# Patient Record
Sex: Male | Born: 1965
Health system: Southern US, Community
[De-identification: ages and names within clinical notes are randomized; demographics above are authoritative.]

## PROBLEM LIST (undated history)

## (undated) DIAGNOSIS — K432 Incisional hernia without obstruction or gangrene: Secondary | ICD-10-CM

## (undated) DIAGNOSIS — E785 Hyperlipidemia, unspecified: Secondary | ICD-10-CM

## (undated) DIAGNOSIS — M5136 Other intervertebral disc degeneration, lumbar region: Secondary | ICD-10-CM

## (undated) DIAGNOSIS — E1140 Type 2 diabetes mellitus with diabetic neuropathy, unspecified: Secondary | ICD-10-CM

## (undated) DIAGNOSIS — K859 Acute pancreatitis without necrosis or infection, unspecified: Secondary | ICD-10-CM

## (undated) DIAGNOSIS — I10 Essential (primary) hypertension: Secondary | ICD-10-CM

## (undated) DIAGNOSIS — N289 Disorder of kidney and ureter, unspecified: Secondary | ICD-10-CM

## (undated) DIAGNOSIS — K5792 Diverticulitis of intestine, part unspecified, without perforation or abscess without bleeding: Secondary | ICD-10-CM

## (undated) DIAGNOSIS — M109 Gout, unspecified: Secondary | ICD-10-CM

## (undated) DIAGNOSIS — I480 Paroxysmal atrial fibrillation: Secondary | ICD-10-CM

## (undated) DIAGNOSIS — K7460 Unspecified cirrhosis of liver: Secondary | ICD-10-CM

## (undated) DIAGNOSIS — M51369 Other intervertebral disc degeneration, lumbar region without mention of lumbar back pain or lower extremity pain: Secondary | ICD-10-CM

## (undated) DIAGNOSIS — E119 Type 2 diabetes mellitus without complications: Secondary | ICD-10-CM

## (undated) HISTORY — DX: Diverticulitis of intestine, part unspecified, without perforation or abscess without bleeding: K57.92

## (undated) HISTORY — DX: Other intervertebral disc degeneration, lumbar region: M51.36

## (undated) HISTORY — DX: Gout, unspecified: M10.9

## (undated) HISTORY — DX: Essential (primary) hypertension: I10

## (undated) HISTORY — DX: Other intervertebral disc degeneration, lumbar region without mention of lumbar back pain or lower extremity pain: M51.369

## (undated) SURGERY — Surgical Case
Anesthesia: *Unknown

---

## 1991-04-24 HISTORY — PX: LAPAROSCOPIC CHOLECYSTECTOMY: SUR755

## 2000-08-14 ENCOUNTER — Encounter: Payer: Self-pay | Admitting: *Deleted

## 2000-08-14 ENCOUNTER — Emergency Department (HOSPITAL_COMMUNITY): Admission: EM | Admit: 2000-08-14 | Discharge: 2000-08-14 | Payer: Self-pay | Admitting: *Deleted

## 2004-04-06 ENCOUNTER — Inpatient Hospital Stay (HOSPITAL_COMMUNITY): Admission: EM | Admit: 2004-04-06 | Discharge: 2004-04-10 | Payer: Self-pay | Admitting: Emergency Medicine

## 2004-05-01 ENCOUNTER — Ambulatory Visit (HOSPITAL_COMMUNITY): Admission: RE | Admit: 2004-05-01 | Discharge: 2004-05-01 | Payer: Self-pay | Admitting: General Surgery

## 2007-07-28 ENCOUNTER — Emergency Department (HOSPITAL_COMMUNITY): Admission: EM | Admit: 2007-07-28 | Discharge: 2007-07-28 | Payer: Self-pay | Admitting: Emergency Medicine

## 2007-08-13 ENCOUNTER — Ambulatory Visit (HOSPITAL_COMMUNITY): Admission: RE | Admit: 2007-08-13 | Discharge: 2007-08-13 | Payer: Self-pay | Admitting: Internal Medicine

## 2007-08-22 ENCOUNTER — Encounter: Admission: RE | Admit: 2007-08-22 | Discharge: 2007-08-22 | Payer: Self-pay | Admitting: Internal Medicine

## 2007-10-20 ENCOUNTER — Ambulatory Visit (HOSPITAL_COMMUNITY): Admission: RE | Admit: 2007-10-20 | Discharge: 2007-10-20 | Payer: Self-pay | Admitting: Internal Medicine

## 2007-11-03 ENCOUNTER — Ambulatory Visit: Admission: RE | Admit: 2007-11-03 | Discharge: 2007-11-03 | Payer: Self-pay | Admitting: Internal Medicine

## 2008-05-06 ENCOUNTER — Encounter: Payer: Self-pay | Admitting: Orthopedic Surgery

## 2008-05-19 ENCOUNTER — Ambulatory Visit: Payer: Self-pay | Admitting: Orthopedic Surgery

## 2008-05-19 DIAGNOSIS — M23302 Other meniscus derangements, unspecified lateral meniscus, unspecified knee: Secondary | ICD-10-CM | POA: Insufficient documentation

## 2008-05-19 DIAGNOSIS — M25569 Pain in unspecified knee: Secondary | ICD-10-CM | POA: Insufficient documentation

## 2008-06-01 ENCOUNTER — Encounter: Admission: RE | Admit: 2008-06-01 | Discharge: 2008-06-01 | Payer: Self-pay | Admitting: Orthopedic Surgery

## 2008-06-01 ENCOUNTER — Telehealth: Payer: Self-pay | Admitting: Orthopedic Surgery

## 2008-06-07 ENCOUNTER — Encounter: Admission: RE | Admit: 2008-06-07 | Discharge: 2008-06-07 | Payer: Self-pay | Admitting: Orthopedic Surgery

## 2008-08-16 ENCOUNTER — Ambulatory Visit (HOSPITAL_COMMUNITY): Admission: RE | Admit: 2008-08-16 | Discharge: 2008-08-16 | Payer: Self-pay | Admitting: Internal Medicine

## 2008-10-04 ENCOUNTER — Emergency Department (HOSPITAL_COMMUNITY): Admission: EM | Admit: 2008-10-04 | Discharge: 2008-10-04 | Payer: Self-pay | Admitting: Emergency Medicine

## 2009-01-10 ENCOUNTER — Emergency Department (HOSPITAL_COMMUNITY): Admission: EM | Admit: 2009-01-10 | Discharge: 2009-01-10 | Payer: Self-pay | Admitting: Emergency Medicine

## 2009-01-16 ENCOUNTER — Ambulatory Visit: Payer: Self-pay | Admitting: Internal Medicine

## 2009-01-16 ENCOUNTER — Inpatient Hospital Stay (HOSPITAL_COMMUNITY): Admission: EM | Admit: 2009-01-16 | Discharge: 2009-01-31 | Payer: Self-pay | Admitting: Emergency Medicine

## 2009-01-17 ENCOUNTER — Ambulatory Visit: Payer: Self-pay | Admitting: Gastroenterology

## 2009-01-19 ENCOUNTER — Ambulatory Visit: Payer: Self-pay | Admitting: Gastroenterology

## 2009-01-21 ENCOUNTER — Encounter: Payer: Self-pay | Admitting: Surgery

## 2009-02-08 ENCOUNTER — Ambulatory Visit (HOSPITAL_COMMUNITY): Admission: RE | Admit: 2009-02-08 | Discharge: 2009-02-08 | Payer: Self-pay | Admitting: Surgery

## 2009-02-11 ENCOUNTER — Encounter: Payer: Self-pay | Admitting: Gastroenterology

## 2009-02-22 ENCOUNTER — Ambulatory Visit (HOSPITAL_COMMUNITY): Admission: RE | Admit: 2009-02-22 | Discharge: 2009-02-22 | Payer: Self-pay | Admitting: Surgery

## 2009-02-24 ENCOUNTER — Ambulatory Visit: Payer: Self-pay | Admitting: Gastroenterology

## 2009-02-24 ENCOUNTER — Ambulatory Visit (HOSPITAL_COMMUNITY): Admission: RE | Admit: 2009-02-24 | Discharge: 2009-02-24 | Payer: Self-pay | Admitting: Gastroenterology

## 2009-03-11 ENCOUNTER — Encounter: Admission: RE | Admit: 2009-03-11 | Discharge: 2009-03-11 | Payer: Self-pay | Admitting: Surgery

## 2009-04-11 ENCOUNTER — Inpatient Hospital Stay (HOSPITAL_COMMUNITY): Admission: RE | Admit: 2009-04-11 | Discharge: 2009-04-25 | Payer: Self-pay | Admitting: Surgery

## 2009-04-15 ENCOUNTER — Encounter (INDEPENDENT_AMBULATORY_CARE_PROVIDER_SITE_OTHER): Payer: Self-pay | Admitting: Surgery

## 2009-04-15 DIAGNOSIS — L03319 Cellulitis of trunk, unspecified: Secondary | ICD-10-CM

## 2009-04-15 DIAGNOSIS — Z9089 Acquired absence of other organs: Secondary | ICD-10-CM | POA: Insufficient documentation

## 2009-04-15 DIAGNOSIS — L02219 Cutaneous abscess of trunk, unspecified: Secondary | ICD-10-CM | POA: Insufficient documentation

## 2009-04-15 DIAGNOSIS — K6389 Other specified diseases of intestine: Secondary | ICD-10-CM | POA: Insufficient documentation

## 2009-04-15 HISTORY — PX: COLON SURGERY: SHX602

## 2009-04-15 HISTORY — PX: APPENDECTOMY: SHX54

## 2009-04-19 ENCOUNTER — Ambulatory Visit: Payer: Self-pay | Admitting: Internal Medicine

## 2009-04-25 ENCOUNTER — Ambulatory Visit: Payer: Self-pay | Admitting: Infectious Diseases

## 2009-04-27 ENCOUNTER — Encounter: Payer: Self-pay | Admitting: Infectious Diseases

## 2009-04-27 ENCOUNTER — Ambulatory Visit: Payer: Self-pay

## 2009-05-04 ENCOUNTER — Telehealth: Payer: Self-pay | Admitting: Internal Medicine

## 2009-05-05 ENCOUNTER — Telehealth: Payer: Self-pay | Admitting: Internal Medicine

## 2009-05-06 ENCOUNTER — Encounter (INDEPENDENT_AMBULATORY_CARE_PROVIDER_SITE_OTHER): Payer: Self-pay | Admitting: *Deleted

## 2009-05-10 ENCOUNTER — Inpatient Hospital Stay (HOSPITAL_COMMUNITY): Admission: RE | Admit: 2009-05-10 | Discharge: 2009-05-18 | Payer: Self-pay | Admitting: Internal Medicine

## 2009-05-10 ENCOUNTER — Ambulatory Visit: Payer: Self-pay | Admitting: Internal Medicine

## 2009-05-30 ENCOUNTER — Encounter (INDEPENDENT_AMBULATORY_CARE_PROVIDER_SITE_OTHER): Payer: Self-pay | Admitting: *Deleted

## 2009-05-30 DIAGNOSIS — Z720 Tobacco use: Secondary | ICD-10-CM | POA: Insufficient documentation

## 2009-05-30 DIAGNOSIS — I10 Essential (primary) hypertension: Secondary | ICD-10-CM | POA: Insufficient documentation

## 2009-05-30 DIAGNOSIS — M109 Gout, unspecified: Secondary | ICD-10-CM | POA: Insufficient documentation

## 2009-05-31 ENCOUNTER — Ambulatory Visit: Payer: Self-pay | Admitting: Internal Medicine

## 2009-05-31 ENCOUNTER — Encounter: Payer: Self-pay | Admitting: Gastroenterology

## 2009-05-31 LAB — CONVERTED CEMR LAB
CO2: 22 meq/L (ref 19–32)
Glucose, Bld: 112 mg/dL — ABNORMAL HIGH (ref 70–99)
HCT: 39.3 % (ref 39.0–52.0)
Hemoglobin: 12.8 g/dL — ABNORMAL LOW (ref 13.0–17.0)
MCV: 97.8 fL (ref >=78.0)
RBC: 4.02 M/uL — ABNORMAL LOW (ref 4.22–5.81)
Sodium: 135 meq/L (ref 135–145)
Total Bilirubin: 0.3 mg/dL (ref 0.3–1.2)
Total Protein: 7.4 g/dL (ref 6.0–8.3)
WBC: 10.9 10*3/uL — ABNORMAL HIGH (ref 4.0–10.5)

## 2009-06-02 ENCOUNTER — Encounter: Payer: Self-pay | Admitting: Internal Medicine

## 2009-06-02 ENCOUNTER — Encounter: Payer: Self-pay | Admitting: Gastroenterology

## 2009-06-03 ENCOUNTER — Telehealth: Payer: Self-pay

## 2009-06-03 ENCOUNTER — Encounter: Payer: Self-pay | Admitting: Internal Medicine

## 2009-06-07 ENCOUNTER — Encounter: Payer: Self-pay | Admitting: Internal Medicine

## 2009-06-08 ENCOUNTER — Encounter: Payer: Self-pay | Admitting: Internal Medicine

## 2009-06-08 ENCOUNTER — Encounter: Admission: RE | Admit: 2009-06-08 | Discharge: 2009-06-08 | Payer: Self-pay | Admitting: Surgery

## 2009-06-09 ENCOUNTER — Telehealth: Payer: Self-pay | Admitting: Internal Medicine

## 2009-06-17 ENCOUNTER — Ambulatory Visit (HOSPITAL_COMMUNITY): Admission: RE | Admit: 2009-06-17 | Discharge: 2009-06-17 | Payer: Self-pay | Admitting: Surgery

## 2009-06-17 ENCOUNTER — Encounter: Payer: Self-pay | Admitting: Internal Medicine

## 2009-06-21 ENCOUNTER — Ambulatory Visit: Payer: Self-pay | Admitting: Internal Medicine

## 2009-06-29 ENCOUNTER — Encounter: Payer: Self-pay | Admitting: Internal Medicine

## 2009-07-07 ENCOUNTER — Encounter: Admission: RE | Admit: 2009-07-07 | Discharge: 2009-07-07 | Payer: Self-pay | Admitting: Surgery

## 2009-07-09 ENCOUNTER — Encounter: Payer: Self-pay | Admitting: Internal Medicine

## 2009-07-11 ENCOUNTER — Encounter: Payer: Self-pay | Admitting: Internal Medicine

## 2009-07-13 ENCOUNTER — Encounter: Payer: Self-pay | Admitting: Internal Medicine

## 2009-07-14 ENCOUNTER — Telehealth: Payer: Self-pay | Admitting: Internal Medicine

## 2009-07-20 ENCOUNTER — Encounter: Payer: Self-pay | Admitting: Internal Medicine

## 2009-07-21 ENCOUNTER — Encounter: Payer: Self-pay | Admitting: Internal Medicine

## 2009-08-02 ENCOUNTER — Ambulatory Visit: Payer: Self-pay | Admitting: Internal Medicine

## 2009-08-02 ENCOUNTER — Ambulatory Visit (HOSPITAL_COMMUNITY): Admission: RE | Admit: 2009-08-02 | Discharge: 2009-08-02 | Payer: Self-pay | Admitting: Surgery

## 2009-08-04 ENCOUNTER — Encounter: Payer: Self-pay | Admitting: Internal Medicine

## 2009-08-10 ENCOUNTER — Ambulatory Visit: Payer: Self-pay | Admitting: Internal Medicine

## 2009-08-10 DIAGNOSIS — K5732 Diverticulitis of large intestine without perforation or abscess without bleeding: Secondary | ICD-10-CM | POA: Insufficient documentation

## 2009-08-23 ENCOUNTER — Encounter: Payer: Self-pay | Admitting: Internal Medicine

## 2009-08-30 ENCOUNTER — Ambulatory Visit (HOSPITAL_COMMUNITY): Admission: RE | Admit: 2009-08-30 | Discharge: 2009-08-30 | Payer: Self-pay | Admitting: Surgery

## 2009-09-05 ENCOUNTER — Encounter: Admission: RE | Admit: 2009-09-05 | Discharge: 2009-09-05 | Payer: Self-pay | Admitting: Surgery

## 2009-09-12 ENCOUNTER — Encounter: Payer: Self-pay | Admitting: Internal Medicine

## 2009-10-04 ENCOUNTER — Ambulatory Visit (HOSPITAL_COMMUNITY): Admission: RE | Admit: 2009-10-04 | Discharge: 2009-10-04 | Payer: Self-pay | Admitting: Surgery

## 2009-10-06 ENCOUNTER — Encounter: Payer: Self-pay | Admitting: Gastroenterology

## 2009-10-11 ENCOUNTER — Encounter: Payer: Self-pay | Admitting: Internal Medicine

## 2009-12-05 ENCOUNTER — Encounter: Payer: Self-pay | Admitting: Internal Medicine

## 2009-12-05 ENCOUNTER — Ambulatory Visit (HOSPITAL_COMMUNITY): Admission: RE | Admit: 2009-12-05 | Discharge: 2009-12-05 | Payer: Self-pay | Admitting: Surgery

## 2010-01-19 ENCOUNTER — Encounter: Payer: Self-pay | Admitting: Gastroenterology

## 2010-01-24 ENCOUNTER — Ambulatory Visit (HOSPITAL_COMMUNITY): Admission: RE | Admit: 2010-01-24 | Discharge: 2010-01-24 | Payer: Self-pay | Admitting: Surgery

## 2010-02-03 ENCOUNTER — Inpatient Hospital Stay (HOSPITAL_COMMUNITY): Admission: RE | Admit: 2010-02-03 | Discharge: 2010-02-08 | Payer: Self-pay | Admitting: Surgery

## 2010-02-03 ENCOUNTER — Ambulatory Visit: Payer: Self-pay | Admitting: Pulmonary Disease

## 2010-02-03 ENCOUNTER — Encounter (INDEPENDENT_AMBULATORY_CARE_PROVIDER_SITE_OTHER): Payer: Self-pay | Admitting: Surgery

## 2010-02-09 ENCOUNTER — Encounter (INDEPENDENT_AMBULATORY_CARE_PROVIDER_SITE_OTHER): Payer: Self-pay | Admitting: *Deleted

## 2010-02-16 ENCOUNTER — Encounter: Payer: Self-pay | Admitting: Internal Medicine

## 2010-03-08 ENCOUNTER — Encounter: Payer: Self-pay | Admitting: Internal Medicine

## 2010-05-14 ENCOUNTER — Encounter: Payer: Self-pay | Admitting: Internal Medicine

## 2010-05-14 ENCOUNTER — Encounter: Payer: Self-pay | Admitting: Surgery

## 2010-05-23 NOTE — Medication Information (Signed)
Summary: [facility]: Verbal Orders  [facility]: Verbal Orders   Imported By: Florinda Marker 06/14/2009 15:34:38  _____________________________________________________________________  External Attachment:    Type:   Image     Comment:   External Document

## 2010-05-23 NOTE — Assessment & Plan Note (Signed)
Summary: F/U APPT/VS   CC:  follow-up visit after JP drain study.  History of Present Illness: [name] is in for his routine visit. After his last visit he had a repeat abdominal drain study. 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  Just like his previous strain study, he experienced abdominal pain and low grade fever in the evening after the study but this resolved spontaneously.  He has had no further fever and his appetite has improved.  Overall he is feeling better.  He is not on any antibiotics.  He estimates the room he only has a few cc of drainage from the catheter he stay.  He saw Dr. Michaell Cowing  this morning and the drain was advanced approximately 4 inches.  Preventive Screening-Counseling & Management  Alcohol-Tobacco     Alcohol drinks/day: <1     Smoking Status: current     Smoking Cessation Counseling: yes     Smoke Cessation Stage: precontemplative     Packs/Day: 1.0  Caffeine-Diet-Exercise     Caffeine use/day: yes     Does Patient Exercise: yes     Type of exercise: walk     Times/week: <3  Safety-Violence-Falls     Seat Belt Use: yes   Current Allergies (reviewed today): No known allergies  Vital Signs:  Patient profile:   45 year old male Height:      71 inches (180.34 cm) Weight:      238.4 pounds (108.36 kg) BMI:     33.37 Pulse rate:   65 / minute BP sitting:   122 / 86  (left arm)  Vitals Entered By: Wendall Mola CMA Duncan Dull) (August 10, 2009 3:14 PM) CC: follow-up visit after JP drain study Is Patient Diabetic? No Pain Assessment Patient in pain? no      Nutritional Status BMI of > 30 = obese Nutritional Status Detail appetite "good"  Does patient need assistance? Functional Status Self care Ambulation Normal Comments no missed doses of meds per patient   Physical Exam  General:  alert and overweight-appearing.   Abdomen:  soft, non-tender, and normal bowel sounds.  his  ostomy bag appears normal.  There is a small amount of bilious drainage in the Jackson-Pratt bulb and a small amount of bloody drainage in the catheter.   Impression & Recommendations:  Problem # 1:  ABSCESS, INTRAABDOMINAL (ICD-682.2) He has a controlled colocutaneous fistula without any evidence of active infection at this time.  I favor continued observation off of antibiotics. His updated medication list for this problem includes:    Oxycodone Hcl 5 Mg Tabs (Oxycodone hcl) .Marland Kitchen... Take 1 tablet by mouth four times a day as needed for pain  Orders: Est. Patient Level III (29518)  Patient Instructions: 1)  Please schedule a follow-up appointment in 1 month on May 24 at 11:30 AM.

## 2010-05-23 NOTE — Miscellaneous (Signed)
Summary: [facility]: Orders  [facility]: Orders   Imported By: Florinda Marker 06/20/2009 09:41:13  _____________________________________________________________________  External Attachment:    Type:   Image     Comment:   External Document

## 2010-05-23 NOTE — Consult Note (Signed)
Summary: Dr. Theodosia Paling  Dr. Theodosia Paling   Imported By: Florinda Marker 10/26/2009 09:42:01  _____________________________________________________________________  External Attachment:    Type:   Image     Comment:   External Document

## 2010-05-23 NOTE — Assessment & Plan Note (Signed)
Summary: F/U/VS   CC:  follow-up visit.  History of Present Illness: [name] continues to struggle with his very stubborn intra-abdominal abscesses. He   Has had multiple abdominal surgeries since last September and has been on multiple courses of broad-spectrum antibiotics.  He had a drain last fall and was on antibiotics in September and October.  His abscess he is relapsed in December and he had repeat incision and drainage followed by a prolonged course of IV tetracycline and oral Diflucan which she completed on February 17.  His Jackson-Pratt drain has remained in.  Last month a CT scan on March 17 showed a small residual abscess collection around the tip of the drain.  He noted that the drainage became thicker and green.  A specimen was obtained from the drain and grew pseudomonas. He has just completed 20 days of antibiotic therapy with oral Septra and Cipro.  He has not had any fevers, chills, abdominal pain or other new problems.  He says he is having very scant drainage with only a diffuse cc out over the past week.  He is scheduled for a repeat drain study today.  He thinks he had a flare of gout in his left second toe last week.  He could not afford the new preparation of colchicine so he took some left over allopurinol and felt better.  He has not seen his primary care physician, Dr. Dwana Melena in several months.  Preventive Screening-Counseling & Management  Alcohol-Tobacco     Alcohol drinks/day: <1     Smoking Status: current     Smoking Cessation Counseling: yes     Smoke Cessation Stage: precontemplative     Packs/Day: 1.0  Caffeine-Diet-Exercise     Caffeine use/day: yes     Does Patient Exercise: yes     Type of exercise: walk     Times/week: <3  Safety-Violence-Falls     Seat Belt Use: yes   Current Allergies (reviewed today): No known allergies  Vital Signs:  Patient profile:   45 year old male Height:      71 inches (180.34 cm) Weight:      238.25  pounds (108.30 kg) BMI:     33.35 Temp:     97.4 degrees F (36.33 degrees C) oral Pulse rate:   65 / minute BP sitting:   117 / 79  (left arm) Cuff size:   large  Vitals Entered By: Jennet Maduro RN (August 02, 2009 10:55 AM) CC: follow-up visit Is Patient Diabetic? No Pain Assessment Patient in pain? no      Nutritional Status BMI of > 30 = obese Nutritional Status Detail appetite "fine"  Have you ever been in a relationship where you felt threatened, hurt or afraid?not asked   Does patient need assistance? Functional Status Self care Ambulation Normal   Physical Exam  General:  alert and overweight-appearing.   Abdomen:  soft, non-tender, and normal bowel sounds.  obese.  His ostomy bag is full of soft brown stool.  He has about 2 cc of light brown drainage in the Jackson-Pratt drain.   Impression & Recommendations:  Problem # 1:  ABSCESS, INTRAABDOMINAL (ICD-682.2) Hopefully the drain study today will show improvement and the drain can be removed.  I do not know what to make of the positive cultures from the drain as they are certainly colonized at this point.  I am not sure if he has any residual abscess but I will leave him off of  antibiotics for now and see him back in one week. The following medications were removed from the medication list:    Ciprofloxacin Hcl 750 Mg Tabs (Ciprofloxacin hcl) .Marland Kitchen... Take 1 tablet by mouth three times a day His updated medication list for this problem includes:    Oxycodone Hcl 5 Mg Tabs (Oxycodone hcl) .Marland Kitchen... Take 1 tablet by mouth four times a day as needed for pain  Orders: Est. Patient Level III ([address]109)  Patient Instructions: 1)  Please schedule a follow-up appointment on Wednesday, April 20.  Prevention & Chronic Care Immunizations   Influenza vaccine: Not documented    Tetanus booster: Not documented    Pneumococcal vaccine: Not documented  Other Screening   Smoking status: current  (08/02/2009)   Smoking cessation  counseling: yes  (08/02/2009)  Lipids   Total Cholesterol: Not documented   LDL: Not documented   LDL Direct: Not documented   HDL: Not documented   Triglycerides: Not documented  Hypertension   Last Blood Pressure: 117 / 79  (08/02/2009)   Serum creatinine: 1.25  (05/31/2009)   Serum potassium 5.1  (05/31/2009)  Self-Management Support :    Hypertension self-management support: Not documented

## 2010-05-23 NOTE — Miscellaneous (Signed)
Summary: [facility]:  [facility]:   Imported By: Florinda Marker 08/05/2009 15:42:02  _____________________________________________________________________  External Attachment:    Type:   Image     Comment:   External Document

## 2010-05-23 NOTE — Consult Note (Signed)
Summary: Primary Care Physician  Primary Care Physician   Imported By: Florinda Marker 03/29/2010 11:31:45  _____________________________________________________________________  External Attachment:    Type:   Image     Comment:   External Document

## 2010-05-23 NOTE — Assessment & Plan Note (Signed)
Summary: F/U APPT/PER DR /VS   CC:  follow-up visit and may have had a problem with dye on a radiology study.  History of Present Illness: Mr. Barreiro is in for his routine follow up appointment.  He underwent a repeat abdominal CT scan two weeks ago which showed no residual abscess.  I spoke with Dr. Michaell Cowing and we both agreed to stop his tigecycline and have the PICC pulled on February 17. He says that he was feeling better with improved appetite.  However, on February 26 he underwent a JP drain study at [facility].  Shortly after returning home he began to have nausea and fairly severe left abdominal pain associated with a mild period of shaking chills.  He does not think that he had any sustained fever.  The symptoms resolved over the next few hours and he has been slowly improving since that time.  The drain study did show a small distal colonic leak.  He says that he has about one to 2 cc of fluid in the Jackson-Pratt bulb daily.  This is not changed over the last month.  He was having trouble tolerating the occlusive dressing for the back wound system so he is switched to daily gauze dressing changes.  Preventive Screening-Counseling & Management  Alcohol-Tobacco     Alcohol drinks/day: <1     Smoking Status: current     Smoking Cessation Counseling: yes     Smoke Cessation Stage: precontemplative     Packs/Day: 1.0  Caffeine-Diet-Exercise     Caffeine use/day: yes     Does Patient Exercise: no   Current Allergies (reviewed today): No known allergies  Vital Signs:  Patient profile:   45 year old male Height:      71 inches (180.34 cm) Weight:      233.9 pounds (106.32 kg) BMI:     32.74 Pulse rate:   78 / minute BP sitting:   108 / 72  (left arm)  Vitals Entered By: Jennet Maduro RN (June 21, 2009 11:35 AM) CC: follow-up visit, may have had a problem with dye on a radiology study Is Patient Diabetic? No Pain Assessment Patient in pain? yes     Location:  abdomen Intensity: 3 Type: soreness Onset of pain  Constant Nutritional Status BMI of > 30 = obese Nutritional Status Detail appetite up and down  Does patient need assistance? Functional Status Self care Ambulation Normal   Physical Exam  General:  alert and overweight-appearing.   Lungs:  normal breath sounds, no crackles, and no wheezes.   Heart:  normal rate, regular rhythm, and no murmur.   Abdomen:  soft and non-tender.  he has a right-sided ileostomy.  There is a gauze dressing on his midline wound.  He has a left-sided J-P drain with about 2 cc of bilious drainage in it.   Impression & Recommendations:  Problem # 1:  ABSCESS, INTRAABDOMINAL (ICD-682.2) His intra-abdominal abscesses related to recent diverticulitis have resolved but he continues to have a small but controlled colonic fistula.  I still think it is best to keep him off of antibiotics at this time.  I've instructed him to call me right away if he has any further fever, chills or other symptoms that might suggest relapsed infection. His updated medication list for this problem includes:    Oxycodone Hcl 5 Mg Tabs (Oxycodone hcl) .Marland Kitchen... Take 1 tablet by mouth four times a day as needed for pain  Orders: Est. Patient Level  III [phone])  Patient Instructions: 1)  Please schedule a follow-up appointment in 1 month.

## 2010-05-23 NOTE — Consult Note (Signed)
Summary: Consultation Report  Consultation Report   Imported By: Florinda Marker 12/28/2009 11:57:10  _____________________________________________________________________  External Attachment:    Type:   Image     Comment:   External Document

## 2010-05-23 NOTE — Miscellaneous (Signed)
Summary: [facility]  [facility]   Imported By: Florinda Marker 08/04/2009 08:45:59  _____________________________________________________________________  External Attachment:    Type:   Image     Comment:   External Document

## 2010-05-23 NOTE — Letter (Signed)
Summary: OFFICE NOTE FROM DR GROSS  OFFICE NOTE FROM DR GROSS   Imported By: Rexene Alberts 01/19/2010 09:57:59  _____________________________________________________________________  External Attachment:    Type:   Image     Comment:   External Document

## 2010-05-23 NOTE — Progress Notes (Signed)
Summary: wound culture - pseudomonas aeruginosa  Phone Note From Other Clinic   Caller: Dr. Karie Soda' office Call For: Dr. Cliffton Asters Details for Reason: Lab Report - wound culture Summary of Call: Abdominal wound culture results = pseudomonas aeruginosa.  Dr. Michaell Cowing wanted Dr. Orvan Falconer to recommend treatment.  Please advise. Jennet Maduro RN  July 15, 2009 11:18 AM    Follow-up for Phone Call        I spoke with Dr. Michaell Cowing yeaterday and recommended Cipr 750 mg po three times a day. Follow-up by: Cliffton Asters MD,  July 15, 2009 1:44 PM    New/Updated Medications: CIPROFLOXACIN HCL 750 MG TABS (CIPROFLOXACIN HCL) Take 1 tablet by mouth three times a day

## 2010-05-23 NOTE — Miscellaneous (Signed)
Summary: Problem list  Clinical Lists Changes  Problems: Added new problem of ABSCESS, INTRAABDOMINAL (ICD-682.2) Added new problem of APPENDECTOMY, HX OF (ICD-V45.79) Added new problem of OTHER SPECIFIED DISORDER OF INTESTINES (ICD-569.89)

## 2010-05-23 NOTE — Progress Notes (Signed)
Summary: [facility] calling   Phone Note From Pharmacy   [facility]er[facility] [phone] Summary of Call: Calling for order to d/c or continue Tygacil.  Appt with Dr Michaell Cowing was 06-08-09. Previous orders were to continue  until appt with Dr Michaell Cowing.  Please advise.  Initial call taken by: Tomasita Morrow RN,  June 09, 2009 12:15 PM  Follow-up for Phone Call        His CT sacn looks better and he has noresidual abscess.I spoke with Dr.Steve Gross, his general surgeon, and we agreed to stophis tigecycline and have the PICC pulled. Follow-up by: Cliffton Asters MD,  June 09, 2009 1:01 PM     Appended Document: [facility] calling  Also, please ask [facility] for a phone number for him and let him know the PICC will be pulled.The home number we have has been disconnected.    Appended Document: [facility] calling  [facility] notified of the above.  Laurell Josephs, RN

## 2010-05-23 NOTE — Consult Note (Signed)
Summary: Consultation Report  Consultation Report   Imported By: Florinda Marker 10/12/2009 15:35:57  _____________________________________________________________________  External Attachment:    Type:   Image     Comment:   External Document

## 2010-05-23 NOTE — Letter (Signed)
Summary: OFFICE NOTE/DR GROSS  OFFICE NOTE/DR GROSS   Imported By: Diana Eves 05/31/2009 15:49:44  _____________________________________________________________________  External Attachment:    Type:   Image     Comment:   External Document

## 2010-05-23 NOTE — Miscellaneous (Signed)
Summary: [facility]   Imported By: Margie Billet 07/06/2009 12:17:33  _____________________________________________________________________  External Attachment:    Type:   Image     Comment:   External Document

## 2010-05-23 NOTE — Miscellaneous (Signed)
Summary: [facility]  [facility]   Imported By: Florinda Marker 08/11/2009 16:04:18  _____________________________________________________________________  External Attachment:    Type:   Image     Comment:   External Document

## 2010-05-23 NOTE — Consult Note (Signed)
Summary: Primary Care Physician  Primary Care Physician   Imported By: Florinda Marker 08/03/2009 15:34:18  _____________________________________________________________________  External Attachment:    Type:   Image     Comment:   External Document

## 2010-05-23 NOTE — Letter (Signed)
Summary: Infectious Disease note  Infectious Disease note   Imported By: Curtis Sites 10/06/2009 10:49:31  _____________________________________________________________________  External Attachment:    Type:   Image     Comment:   External Document

## 2010-05-23 NOTE — Consult Note (Signed)
Summary: Primary Care Physician  Primary Care Physician   Imported By: Florinda Marker 08/04/2009 08:46:38  _____________________________________________________________________  External Attachment:    Type:   Image     Comment:   External Document

## 2010-05-23 NOTE — Procedures (Signed)
Summary: Gastroenterology  Gastroenterology   Imported By: Florinda Marker 07/05/2009 16:07:50  _____________________________________________________________________  External Attachment:    Type:   Image     Comment:   External Document

## 2010-05-23 NOTE — Assessment & Plan Note (Signed)
Summary: [facility] appt need chart/kam   CC:  [facility], diverticulitist, abscess in small intest which was removed, and abdominal wound being treated with wet-to-dry dressing by wife.  One dose left of IV antibiotics for today.Marland Kitchen  History of Present Illness: [name] is a 45 year old who has recurrent diverticulitis.  His first bout was in 2005 and resolved with medical therapy.  He was admitted to [facility] on September 26 with diverticulitis complicated by perforation and abscess formation.  A drain was placed.  Gram stain of the abscess fluid showed polymicrobial flora and cultures done on antibiotic therapy grew a sensitive enterococcus and Candida albicans.  He improved and was discharged on October 11 on Augmentin.  The drain was pulled in early November.  He continued to have intermittent drainage from the drain site.  He was admitted to [facility] on December 20 and underwent sigmoid colectomy and abscess drainage.  Four days later a CT scan showed residual abscess so he underwent repeat operation at which time another abscess was found that had caused some small bowel perforation.  He underwent I&D and partial small bowel resection. Operative cultures grew vancomycin-resistant enterococcus.  He was treated with Zyvox, Zosyn and Diflucan in the hospital and discharged home on January 3 on IV Tygacil and oral Diflucan.  Since discharge he has noted increased abdominal bloating, worsening of his chronic reflux, and intermittent sharp lower abdominal pains.  He has at times felt nauseous but has not had any vomiting.  He says his appetite is poor and he has lost a total of 30 pounds since he was admitted to [facility] in December.  He is not constipated and has not had any diarrhea.  He has not had any fever chills or sweats.  He has not had any problems with his PICC.  Preventive Screening-Counseling & Management  Alcohol-Tobacco     Alcohol drinks/day:  <1     Smoking Status: current     Smoking Cessation Counseling: yes     Smoke Cessation Stage: precontemplative     Packs/Day: 1.0  Caffeine-Diet-Exercise     Caffeine use/day: 3     Does Patient Exercise: no   Updated Prior Medication List: NORCO 5-325 MG TABS (HYDROCODONE-ACETAMINOPHEN) 1 by mouth Q 4 as needed ATENOLOL-CHLORTHALIDONE 100-25 MG TABS (ATENOLOL-CHLORTHALIDONE)  KLOR-CON 10 10 MEQ CR-TABS (POTASSIUM CHLORIDE)  OMEPRAZOLE 40 MG CPDR (OMEPRAZOLE)  COLCHICINE 0.6 MG TABS (COLCHICINE) Take 1 tablet by mouth once a day FLUCONAZOLE 200 MG TABS (FLUCONAZOLE) Take 2 tablets by mouth once a day per hosp. MD TYGACIL 50 MG SOLR (TIGECYCLINE) IV two times a day OXYCODONE HCL 5 MG TABS (OXYCODONE HCL) Take 1 tablet by mouth four times a day as needed for pain  Current Allergies (reviewed today): No known allergies  Vital Signs:  Patient profile:   45 year old male Height:      71 inches (180.34 cm) Weight:      252.6 pounds (114.82 kg) BMI:     35.36 Temp:     96.7 degrees F (35.94 degrees C) oral Pulse rate:   66 / minute BP sitting:   133 / 89  (left arm) Cuff size:   large  Vitals Entered By: Jennet Maduro RN (May 10, 2009 10:02 AM) CC: [facility], diverticulitist, abscess in small intest which was removed, abdominal wound being treated with wet-to-dry dressing by wife.  One dose left of IV antibiotics for today. Is Patient Diabetic? No  Pain Assessment Patient in pain? yes     Location: abdominal Intensity: 5 Type: shrap Onset of pain  constant, worse with movement Nutritional Status BMI of > 30 = obese Nutritional Status Detail appetite " not too good"  Have you ever been in a relationship where you felt threatened, hurt or afraid?not asked, wife present   Does patient need assistance? Functional Status Self care Ambulation Normal   Physical Exam  General:  alert and overweight-appearing.   Abdomen:  soft and normal bowel sounds.  he is obese.  He has  mild diffuse tenderness.  His lower midline incision has 4 open areas all with bright red granulation tissue.  His girlfriend says that they are 50 to 60% smaller than when he left the hospital. Skin:  his right arm PICC site looks normal.   Impression & Recommendations:  Problem # 1:  ABSCESS, INTRAABDOMINAL (ICD-682.2) [name] has now been on antibiotics for a total of 30 days.  I suspect that the is abdominal bloating, worsening reflux, nausea, and anorexia are in large part due to side effects of Tygacil but I will obtain a follow-up abdominal and pelvic CT scan to see if there is any residual abscess.  I do not see any abscess then I will stop his antibiotics and have the PICC removed. The following medications were removed from the medication list:    Norco 5-325 Mg Tabs (Hydrocodone-acetaminophen) .Marland Kitchen... 1 by mouth q 4 as needed His updated medication list for this problem includes:    Oxycodone Hcl 5 Mg Tabs (Oxycodone hcl) .Marland Kitchen... Take 1 tablet by mouth four times a day as needed for pain  Orders: Est. Patient Level IV (16073) CT without Contrast (CT w/o contrast)  Medications Added to Medication List This Visit: 1)  Colchicine 0.6 Mg Tabs (Colchicine) .... Take 1 tablet by mouth once a day 2)  Fluconazole 200 Mg Tabs (Fluconazole) .... Take 2 tablets by mouth once a day per hosp. md 3)  Tygacil 50 Mg Solr (Tigecycline) .... Iv two times a day 4)  Oxycodone Hcl 5 Mg Tabs (Oxycodone hcl) .... Take 1 tablet by mouth four times a day as needed for pain  Patient Instructions: 1)  Please schedule a follow-up appointment in 2 weeks. Prescriptions: OXYCODONE HCL 5 MG TABS (OXYCODONE HCL) Take 1 tablet by mouth four times a day as needed for pain  #60 x 0   Entered and Authorized by:   Cliffton Asters MD   Signed by:   Cliffton Asters MD on 05/10/2009   Method used:   Print then Give to Patient   RxID:   [id number]  Phone call to [facility] to wait to d/c [facility] and IV antibiotics until  results from abdominal CT are completed. Jennet Maduro RN  May 10, 2009 11:20 AM

## 2010-05-23 NOTE — Consult Note (Signed)
Summary: Consultation Report  Consultation Report   Imported By: Florinda Marker 03/07/2010 15:30:33  _____________________________________________________________________  External Attachment:    Type:   Image     Comment:   External Document

## 2010-05-23 NOTE — Letter (Signed)
Summary: OFFICE NOTE  OFFICE NOTE   Imported By: Diana Eves 06/02/2009 11:42:40  _____________________________________________________________________  External Attachment:    Type:   Image     Comment:   External Document  Appended Document: OFFICE NOTE LAST VISIT w/ Dr. Michaell Cowing Jul 08, 2009-CONTINUED infections, Rx: BAC/FLAG.  Appended Document: OFFICE NOTE Jul 20, 2009-Pseudomonas interloop infection, ILEOSTOMY, DRAIN-RIGHT ABD

## 2010-05-23 NOTE — Letter (Signed)
Summary: Recall, Screening Colonoscopy Only  [facility]  [address]   [place], [place] [address]   Phone: [phone]  Fax: [phone]    February 09, 2010  [facility] [address] [place], [place]  [address] [birth year]   Dear Mr. [facility],   Our records indicate it is time to schedule your colonoscopy.    Please call our office at [phone] and ask for the nurse.   Thank you,    Hendricks Limes, LPN Cloria Spring, LPN  [facility] Ph: [phone]   Fax: [phone]

## 2010-05-23 NOTE — Progress Notes (Signed)
  Phone Note Other Incoming   Caller: Girl friend, Isabella Stalling [phone] Details for Reason: N, V Summary of Call: Jaishon has had some anorexia since starting Tygacil on 1/3. This morning he had one episode of N&V associated with the "smell of the NS flush" for his IV. I suggested he try hard candy and antiemetics. I will move his appointment with me up to 1/18. Initial call taken by: Cliffton Asters MD,  May 04, 2009 2:31 PM

## 2010-05-23 NOTE — Progress Notes (Signed)
Summary: [facility]- Question about IV At[facility]  Phone Note Call from Patient   Caller: [facility] [phone] Summary of Call: Pt has not had Tygacil for at least 3 days. Is he to continue or should PICC be pulled?  Response: According to last OV pt is to resume Tygacil at least through 06-08-09 OV with Dr Alexandria Lodge.  I will forward a copy of the office note via fax. Tomasita Morrow RN  June 03, 2009 11:39 AM  Initial call taken by: Tomasita Morrow RN,  June 03, 2009 11:39 AM

## 2010-05-23 NOTE — Consult Note (Signed)
Summary: Dr. Karie Soda  Dr. Karie Soda   Imported By: Florinda Marker 08/02/2009 15:36:12  _____________________________________________________________________  External Attachment:    Type:   Image     Comment:   External Document

## 2010-05-23 NOTE — Medication Information (Signed)
Summary: [facility]:RX  [facility]   Imported By: Florinda Marker 06/09/2009 15:36:25  _____________________________________________________________________  External Attachment:    Type:   Image     Comment:   External Document

## 2010-05-23 NOTE — Miscellaneous (Signed)
Summary: [facility] Cert[facility] Plan Of Care  [facility] Plan [facility]   Imported By: Florinda Marker 09/01/2009 12:07:04  _____________________________________________________________________  External Attachment:    Type:   Image     Comment:   External Document

## 2010-05-23 NOTE — Miscellaneous (Signed)
Summary: HIPAA Restrictions  HIPAA Restrictions   Imported By: Florinda Marker 05/10/2009 14:17:07  _____________________________________________________________________  External Attachment:    Type:   Image     Comment:   External Document

## 2010-05-23 NOTE — Miscellaneous (Signed)
Summary: Problems and Medications  Clinical Lists Changes  Problems: Added new problem of MORBID OBESITY (ICD-278.01) Added new problem of GOUT (ICD-274.9) Added new problem of TOBACCO ABUSE (ICD-305.1) Added new problem of HYPERTENSION (ICD-401.9) Medications: Added new medication of OMEPRAZOLE 40 MG CPDR (OMEPRAZOLE) Take 1 capsule by mouth once a day per [facility]. MD

## 2010-05-23 NOTE — Progress Notes (Signed)
Summary: [facility] calling  Phone Note Other Incoming   Caller: [facility] Jovita Gamma  [phone] Summary of Call: Pt thought he was to have labs done since he is getting Tygacil.  There is no protocol for labs with this medication. Does Dr Wess Botts want labs drawn? If so call order to [facility]. Tomasita Morrow RN  May 05, 2009 10:47 AM  Initial call taken by: Tomasita Morrow RN,  May 05, 2009 10:47 AM  Follow-up for Phone Call        No lab moniotoring is needed for Tygacil. Follow-up by: Cliffton Asters MD,  May 05, 2009 4:42 PM

## 2010-05-23 NOTE — Consult Note (Signed)
Summary: Dr. Karie Soda  Dr. Karie Soda   Imported By: Florinda Marker 08/24/2009 15:30:41  _____________________________________________________________________  External Attachment:    Type:   Image     Comment:   External Document

## 2010-05-23 NOTE — Assessment & Plan Note (Signed)
Summary: hsfu/wl/kam   CC:  [facility].  History of Present Illness: [name] is in for his hospital follow-up visit. When I saw him last month, he was having abdominal discomfort and nausea.  A follow-up abdominal CT scans showed free air and he was readmitted to [facility]. He underwent exploratory laparotomy with repeat ID of abscesses, ileostomy and repair of the leak. Cultures grew a sensitive enterococcus and Enterobacter.  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 He  has had some anorexia and mild intermittent nausea but no vomiting.  He has not had any fever, chills or sweats.  Overall he is feeling better.  He has not had any problems tolerating his PICC line.  He still has a drain that has a small amount of bilious drainage out daily.  His abdominal wound has been getting smaller.  Preventive Screening-Counseling & Management  Alcohol-Tobacco     Alcohol drinks/day: <1     Smoking Status: current     Smoking Cessation Counseling: yes     Smoke Cessation Stage: precontemplative     Packs/Day: 1.0  Caffeine-Diet-Exercise     Caffeine use/day: 3     Does Patient Exercise: no   Prior Medication List:  ATENOLOL-CHLORTHALIDONE 100-25 MG TABS (ATENOLOL-CHLORTHALIDONE)  KLOR-CON 10 10 MEQ CR-TABS (POTASSIUM CHLORIDE)  OMEPRAZOLE 40 MG CPDR (OMEPRAZOLE)  COLCHICINE 0.6 MG TABS (COLCHICINE) Take 1 tablet by mouth once a day FLUCONAZOLE 200 MG TABS (FLUCONAZOLE) Take 2 tablets by mouth once a day per hosp. MD TYGACIL 50 MG SOLR (TIGECYCLINE) IV two times a day OXYCODONE HCL 5 MG TABS (OXYCODONE HCL) Take 1 tablet by mouth four times a day as needed for pain OMEPRAZOLE 40 MG CPDR (OMEPRAZOLE) Take 1 capsule by mouth once a day per [facility]. MD   Current Allergies (reviewed today): No known allergies  Vital Signs:  Patient profile:   45 year old male Height:      71 inches (180.34 cm) Weight:      244.50 pounds (111.14 kg) BMI:      34.22 Temp:     95.4 degrees F (35.22 degrees C) oral Pulse rate:   62 / minute BP sitting:   129 / 90  (left arm)  Vitals Entered By: Starleen Arms CMA (May 31, 2009 11:30 AM) CC: H[facility]U Is Patient Diabetic? No Pain Assessment Patient in pain? no      Nutritional Status BMI of > 30 = obese Nutritional Status Detail NOT EATING WELL  Does patient need assistance? Functional Status Self care Ambulation Normal   Physical Exam  General:  alert and overweight-appearing.  this weight is down 10 pounds since his last visit. Abdomen:  soft and non-tender.  he has a right-sided ileostomy.  There is a VAC dressing on his midline wound.  He has a left-sided J-P drain with a small amount of bilious drainage in it.   Impression & Recommendations:  Problem # 1:  ABSCESS, INTRAABDOMINAL (ICD-682.2) [name] has had a very complicated course with his polymicrobial intra-abdominal abscesses.  It appears that he had continued his fluconazole after his recent discharge even though he did not grow yeast again.  I will have him stop that when he runs out in two days.  I will continue his tigacycline at least through his next visit with Dr. Michaell Cowing on February 16. His updated medication list for this problem includes:    Oxycodone Hcl 5 Mg Tabs (Oxycodone hcl) .Marland KitchenMarland KitchenMarland KitchenMarland Kitchen  Take 1 tablet by mouth four times a day as needed for pain  Orders: Est. Patient Level III ([address]) T-Comprehensive Metabolic Panel ([id number]) T-CBC No Diff ([id number])  Medications Added to Medication List This Visit: 1)  Aleve 220 Mg Tabs (Naproxen sodium) .... Take 2 tablets by mouth two times a day  Patient Instructions: 1)  Please schedule a follow-up appointment 0n 06/21/09.  Process Orders Check Orders Results:     [facility]: ABN not required for this insurance Tests Sent for requisitioning (May 31, 2009 12:00 PM):     05/31/2009: [facility] -- T-Comprehensive Metabolic  Panel [[id number]] (signed)     05/31/2009: [facility] -- T-CBC No Diff [[id number]] (signed)     Appended Document: hsfu/wl/kam Blood for labs drawn from right upper arm PICC.  Flushed with 10cc NS and 2.5cc Heparin per protocol. Chinita Pester RN  May 31, 2009 12:15 PM

## 2010-07-05 LAB — CBC
HCT: 31.2 % — ABNORMAL LOW (ref 39.0–52.0)
HCT: 32.4 % — ABNORMAL LOW (ref 39.0–52.0)
Hemoglobin: 11.2 g/dL — ABNORMAL LOW (ref 13.0–17.0)
Hemoglobin: 13.7 g/dL (ref 13.0–17.0)
Hemoglobin: 15.4 g/dL (ref 13.0–17.0)
MCH: 38.6 pg — ABNORMAL HIGH (ref 26.0–34.0)
MCHC: 34.9 g/dL (ref 30.0–36.0)
MCV: 111.1 fL — ABNORMAL HIGH (ref 78.0–100.0)
MCV: 111.1 fL — ABNORMAL HIGH (ref 78.0–100.0)
Platelets: 138 10*3/uL — ABNORMAL LOW (ref 150–400)
Platelets: 150 10*3/uL (ref 150–400)
RBC: 2.92 MIL/uL — ABNORMAL LOW (ref 4.22–5.81)
RBC: 3.05 MIL/uL — ABNORMAL LOW (ref 4.22–5.81)
RBC: 3.55 MIL/uL — ABNORMAL LOW (ref 4.22–5.81)
RDW: 16.5 % — ABNORMAL HIGH (ref 11.5–15.5)
RDW: 16.7 % — ABNORMAL HIGH (ref 11.5–15.5)
RDW: 16.7 % — ABNORMAL HIGH (ref 11.5–15.5)
WBC: 10.2 10*3/uL (ref 4.0–10.5)
WBC: 11.2 10*3/uL — ABNORMAL HIGH (ref 4.0–10.5)
WBC: 15.5 10*3/uL — ABNORMAL HIGH (ref 4.0–10.5)
WBC: 20.4 10*3/uL — ABNORMAL HIGH (ref 4.0–10.5)

## 2010-07-05 LAB — BASIC METABOLIC PANEL
BUN: 21 mg/dL (ref 6–23)
BUN: 34 mg/dL — ABNORMAL HIGH (ref 6–23)
CO2: 23 mEq/L (ref 19–32)
Calcium: 8.2 mg/dL — ABNORMAL LOW (ref 8.4–10.5)
Chloride: 105 mEq/L (ref 96–112)
Chloride: 105 mEq/L (ref 96–112)
GFR calc Af Amer: 27 mL/min — ABNORMAL LOW (ref >60)
GFR calc non Af Amer: 24 mL/min — ABNORMAL LOW (ref >60)
GFR calc non Af Amer: 33 mL/min — ABNORMAL LOW (ref >60)
GFR calc non Af Amer: 48 mL/min — ABNORMAL LOW (ref >60)
Glucose, Bld: 120 mg/dL — ABNORMAL HIGH (ref 70–99)
Glucose, Bld: 135 mg/dL — ABNORMAL HIGH (ref 70–99)
Potassium: 3.5 mEq/L (ref 3.5–5.1)
Potassium: 3.6 mEq/L (ref 3.5–5.1)
Potassium: 3.8 mEq/L (ref 3.5–5.1)
Potassium: 5.8 mEq/L — ABNORMAL HIGH (ref 3.5–5.1)
Sodium: 134 mEq/L — ABNORMAL LOW (ref 135–145)
Sodium: 135 mEq/L (ref 135–145)
Sodium: 136 mEq/L (ref 135–145)
Sodium: 136 mEq/L (ref 135–145)

## 2010-07-05 LAB — BLOOD GAS, ARTERIAL
Acid-base deficit: 5.9 mmol/L — ABNORMAL HIGH (ref 0.0–2.0)
Bicarbonate: 19.1 mEq/L — ABNORMAL LOW (ref 20.0–24.0)
TCO2: 17.3 mmol/L (ref 0–100)
pCO2 arterial: 38.2 mmHg (ref 35.0–45.0)
pH, Arterial: 7.32 — ABNORMAL LOW (ref 7.350–7.450)
pO2, Arterial: 106 mmHg — ABNORMAL HIGH (ref 80.0–100.0)

## 2010-07-05 LAB — GLUCOSE, CAPILLARY
Glucose-Capillary: 124 mg/dL — ABNORMAL HIGH (ref 70–99)
Glucose-Capillary: 124 mg/dL — ABNORMAL HIGH (ref 70–99)
Glucose-Capillary: 126 mg/dL — ABNORMAL HIGH (ref 70–99)
Glucose-Capillary: 132 mg/dL — ABNORMAL HIGH (ref 70–99)
Glucose-Capillary: 137 mg/dL — ABNORMAL HIGH (ref 70–99)
Glucose-Capillary: 141 mg/dL — ABNORMAL HIGH (ref 70–99)

## 2010-07-05 LAB — POTASSIUM: Potassium: 4.7 mEq/L (ref 3.5–5.1)

## 2010-07-06 LAB — BLOOD GAS, ARTERIAL
Bicarbonate: 19.7 mEq/L — ABNORMAL LOW (ref 20.0–24.0)
MECHVT: 700 mL
MECHVT: 800 mL
PEEP: 5 cmH2O
PEEP: 5 cmH2O
Patient temperature: 97.8
TCO2: 18 mmol/L (ref 0–100)
pCO2 arterial: 41.9 mmHg (ref 35.0–45.0)
pH, Arterial: 7.225 — ABNORMAL LOW (ref 7.350–7.450)
pH, Arterial: 7.292 — ABNORMAL LOW (ref 7.350–7.450)

## 2010-07-06 LAB — BASIC METABOLIC PANEL
BUN: 14 mg/dL (ref 6–23)
BUN: 9 mg/dL (ref 6–23)
BUN: 19 mg/dL (ref 6–23)
CO2: 21 mEq/L (ref 19–32)
CO2: 22 mEq/L (ref 19–32)
CO2: 24 mEq/L (ref 19–32)
CO2: 20 mEq/L (ref 19–32)
Chloride: 106 mEq/L (ref 96–112)
Chloride: 110 mEq/L (ref 96–112)
Chloride: 110 mEq/L (ref 96–112)
Chloride: 107 mEq/L (ref 96–112)
Creatinine, Ser: 1.49 mg/dL (ref 0.4–1.5)
Creatinine, Ser: 1.51 mg/dL — ABNORMAL HIGH (ref 0.4–1.5)
Creatinine, Ser: 1.84 mg/dL — ABNORMAL HIGH (ref 0.4–1.5)
GFR calc Af Amer: >60 mL/min (ref >60)
Glucose, Bld: 132 mg/dL — ABNORMAL HIGH (ref 70–99)
Glucose, Bld: 98 mg/dL (ref 70–99)
Potassium: 4.7 mEq/L (ref 3.5–5.1)

## 2010-07-06 LAB — CBC
Hemoglobin: 13.6 g/dL (ref 13.0–17.0)
MCH: 38.3 pg — ABNORMAL HIGH (ref 26.0–34.0)
MCV: 110.1 fL — ABNORMAL HIGH (ref 78.0–100.0)
Platelets: 229 10*3/uL (ref 150–400)
RBC: 3.54 MIL/uL — ABNORMAL LOW (ref 4.22–5.81)

## 2010-07-06 LAB — POCT I-STAT, CHEM 8
BUN: 14 mg/dL (ref 6–23)
Calcium, Ion: 1.16 mmol/L (ref 1.12–1.32)
Chloride: 104 mEq/L (ref 96–112)
Glucose, Bld: 99 mg/dL (ref 70–99)
HCT: 44 % (ref 39.0–52.0)

## 2010-07-06 LAB — POTASSIUM: Potassium: 5.4 mEq/L — ABNORMAL HIGH (ref 3.5–5.1)

## 2010-07-09 LAB — CBC
HCT: 31.9 % — ABNORMAL LOW (ref 39.0–52.0)
HCT: 33.2 % — ABNORMAL LOW (ref 39.0–52.0)
HCT: 30.5 % — ABNORMAL LOW (ref 39.0–52.0)
Hemoglobin: 10.7 g/dL — ABNORMAL LOW (ref 13.0–17.0)
Hemoglobin: 10.3 g/dL — ABNORMAL LOW (ref 13.0–17.0)
Hemoglobin: 9.6 g/dL — ABNORMAL LOW (ref 13.0–17.0)
Hemoglobin: 9.8 g/dL — ABNORMAL LOW (ref 13.0–17.0)
MCHC: 33.4 g/dL (ref 30.0–36.0)
MCHC: 33.8 g/dL (ref 30.0–36.0)
MCHC: 33.5 g/dL (ref 30.0–36.0)
MCHC: 33.6 g/dL (ref 30.0–36.0)
MCHC: 33.7 g/dL (ref 30.0–36.0)
MCV: 99.9 fL (ref 78.0–100.0)
MCV: 96.6 fL (ref 78.0–100.0)
MCV: 97.8 fL (ref 78.0–100.0)
MCV: 98.2 fL (ref 78.0–100.0)
Platelets: 164 10*3/uL (ref 150–400)
RBC: 3.33 MIL/uL — ABNORMAL LOW (ref 4.22–5.81)
RBC: 2.95 MIL/uL — ABNORMAL LOW (ref 4.22–5.81)
RBC: 3.12 MIL/uL — ABNORMAL LOW (ref 4.22–5.81)
RBC: 4.12 MIL/uL — ABNORMAL LOW (ref 4.22–5.81)
RDW: 17 % — ABNORMAL HIGH (ref 11.5–15.5)
RDW: 15.9 % — ABNORMAL HIGH (ref 11.5–15.5)
RDW: 15.9 % — ABNORMAL HIGH (ref 11.5–15.5)
WBC: 9.3 10*3/uL (ref 4.0–10.5)
WBC: 16.9 10*3/uL — ABNORMAL HIGH (ref 4.0–10.5)
WBC: 9.1 10*3/uL (ref 4.0–10.5)

## 2010-07-09 LAB — BASIC METABOLIC PANEL
BUN: 27 mg/dL — ABNORMAL HIGH (ref 6–23)
CO2: 29 mEq/L (ref 19–32)
CO2: 22 mEq/L (ref 19–32)
CO2: 24 mEq/L (ref 19–32)
CO2: 24 mEq/L (ref 19–32)
Calcium: 8.4 mg/dL (ref 8.4–10.5)
Calcium: 8.6 mg/dL (ref 8.4–10.5)
Chloride: 101 mEq/L (ref 96–112)
Chloride: 100 mEq/L (ref 96–112)
Chloride: 105 mEq/L (ref 96–112)
Creatinine, Ser: 1.57 mg/dL — ABNORMAL HIGH (ref 0.4–1.5)
Creatinine, Ser: 2.25 mg/dL — ABNORMAL HIGH (ref 0.4–1.5)
GFR calc Af Amer: 36 mL/min — ABNORMAL LOW (ref >60)
GFR calc Af Amer: 37 mL/min — ABNORMAL LOW (ref >60)
GFR calc Af Amer: 38 mL/min — ABNORMAL LOW (ref >60)
GFR calc non Af Amer: 22 mL/min — ABNORMAL LOW (ref >60)
Glucose, Bld: 77 mg/dL (ref 70–99)
Glucose, Bld: 90 mg/dL (ref 70–99)
Potassium: 5 mEq/L (ref 3.5–5.1)
Potassium: 5 mEq/L (ref 3.5–5.1)
Sodium: 133 mEq/L — ABNORMAL LOW (ref 135–145)
Sodium: 133 mEq/L — ABNORMAL LOW (ref 135–145)
Sodium: 136 mEq/L (ref 135–145)

## 2010-07-09 LAB — COMPREHENSIVE METABOLIC PANEL
ALT: 10 U/L (ref 0–53)
ALT: 14 U/L (ref 0–53)
AST: 14 U/L (ref 0–37)
AST: 14 U/L (ref 0–37)
AST: 38 U/L — ABNORMAL HIGH (ref 0–37)
Albumin: 2.1 g/dL — ABNORMAL LOW (ref 3.5–5.2)
Albumin: 2.8 g/dL — ABNORMAL LOW (ref 3.5–5.2)
Alkaline Phosphatase: 40 U/L (ref 39–117)
BUN: 10 mg/dL (ref 6–23)
CO2: 30 mEq/L (ref 19–32)
CO2: 31 mEq/L (ref 19–32)
CO2: 26 mEq/L (ref 19–32)
Calcium: 8.9 mg/dL (ref 8.4–10.5)
Calcium: 8.4 mg/dL (ref 8.4–10.5)
Calcium: 9.1 mg/dL (ref 8.4–10.5)
Chloride: 98 mEq/L (ref 96–112)
Creatinine, Ser: 1.76 mg/dL — ABNORMAL HIGH (ref 0.4–1.5)
Creatinine, Ser: 2.08 mg/dL — ABNORMAL HIGH (ref 0.4–1.5)
Creatinine, Ser: 1.17 mg/dL (ref 0.4–1.5)
Creatinine, Ser: 1.46 mg/dL (ref 0.4–1.5)
GFR calc Af Amer: 42 mL/min — ABNORMAL LOW (ref >60)
GFR calc Af Amer: >60 mL/min (ref >60)
GFR calc non Af Amer: 35 mL/min — ABNORMAL LOW (ref >60)
GFR calc non Af Amer: 42 mL/min — ABNORMAL LOW (ref >60)
GFR calc non Af Amer: 52 mL/min — ABNORMAL LOW (ref >60)
Glucose, Bld: 80 mg/dL (ref 70–99)
Potassium: 4.6 mEq/L (ref 3.5–5.1)
Sodium: 136 mEq/L (ref 135–145)
Sodium: 137 mEq/L (ref 135–145)
Sodium: 135 mEq/L (ref 135–145)
Total Bilirubin: 0.6 mg/dL (ref 0.3–1.2)
Total Protein: 6.8 g/dL (ref 6.0–8.3)
Total Protein: 5.9 g/dL — ABNORMAL LOW (ref 6.0–8.3)
Total Protein: 7.8 g/dL (ref 6.0–8.3)

## 2010-07-09 LAB — URINE MICROSCOPIC-ADD ON

## 2010-07-09 LAB — PREALBUMIN: Prealbumin: 10.2 mg/dL — ABNORMAL LOW (ref 18.0–45.0)

## 2010-07-09 LAB — DIFFERENTIAL
Basophils Relative: 1 % (ref 0–1)
Eosinophils Absolute: 0.3 10*3/uL (ref 0.0–0.7)
Eosinophils Relative: 3 % (ref 0–5)
Eosinophils Relative: 3 % (ref 0–5)
Lymphocytes Relative: 33 % (ref 12–46)
Lymphs Abs: 1.9 10*3/uL (ref 0.7–4.0)
Lymphs Abs: 4.1 10*3/uL — ABNORMAL HIGH (ref 0.7–4.0)
Monocytes Absolute: 1.1 10*3/uL — ABNORMAL HIGH (ref 0.1–1.0)
Monocytes Relative: 12 % (ref 3–12)
Monocytes Relative: 9 % (ref 3–12)

## 2010-07-09 LAB — CULTURE, ROUTINE-ABSCESS

## 2010-07-09 LAB — URINE CULTURE: Culture: NO GROWTH

## 2010-07-09 LAB — GLUCOSE, CAPILLARY: Glucose-Capillary: 98 mg/dL (ref 70–99)

## 2010-07-09 LAB — URINALYSIS, ROUTINE W REFLEX MICROSCOPIC
Glucose, UA: NEGATIVE mg/dL
pH: 5 (ref 5.0–8.0)

## 2010-07-09 LAB — ANAEROBIC CULTURE

## 2010-07-09 LAB — PROTIME-INR: Prothrombin Time: 14.5 seconds (ref 11.6–15.2)

## 2010-07-09 LAB — CREATININE, SERUM
Creatinine, Ser: 1.05 mg/dL (ref 0.4–1.5)
GFR calc non Af Amer: >60 mL/min (ref >60)

## 2010-07-09 LAB — POTASSIUM: Potassium: 4.1 mEq/L (ref 3.5–5.1)

## 2010-07-11 IMAGING — RF DG SINUS / FISTULA TRACT / ABSCESSOGRAM
12 series · 12 of 12 positions shown · non-contrast
Comparison: No prior fistulogram.  There is correlation with recent
CT.

CLINICAL DATA: Status post percutaneous drainage of diverticular
abscess

SINUS TRACT INJECTION/FISTULOGRAM
TECHNIQUE: Fluoroscopic monitor injection of contrast into the
patient's right abscess drainage catheter.

[Series 1: run · 1 of 1 slices shown (1 of 12)]
[im 1/1]
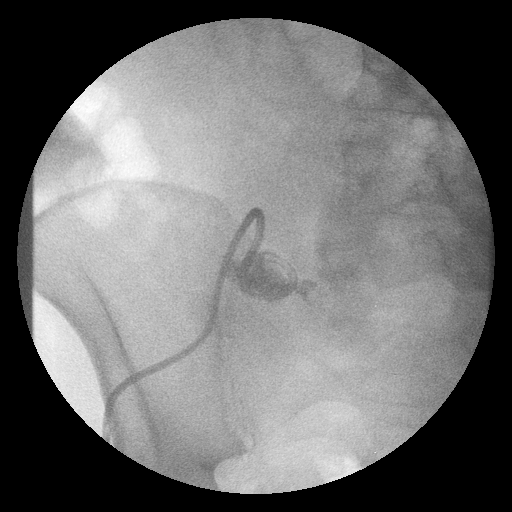

[Series 2: run · 1 of 1 slices shown (2 of 12)]
[im 1/1]
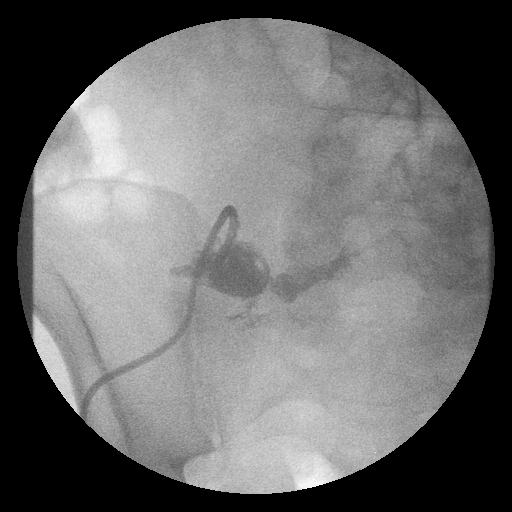

[Series 3: run · 1 of 1 slices shown (3 of 12)]
[im 1/1]
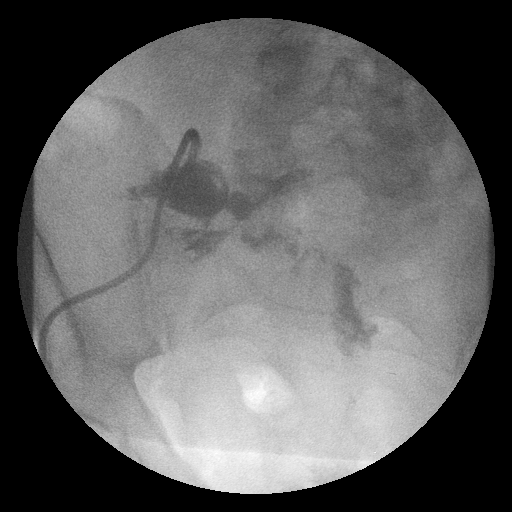

[Series 4: run · 1 of 1 slices shown (4 of 12)]
[im 1/1]
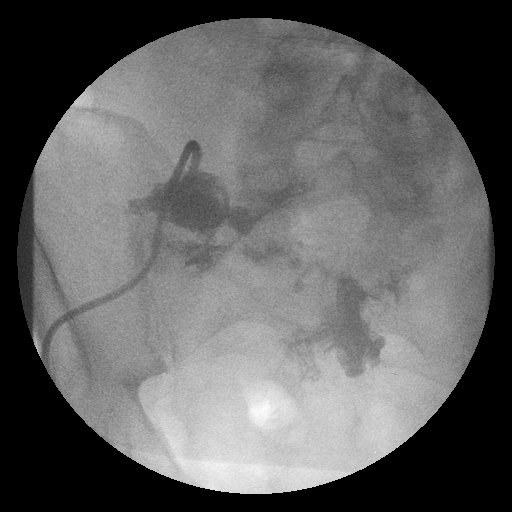

[Series 5: run · 1 of 1 slices shown (5 of 12)]
[im 1/1]
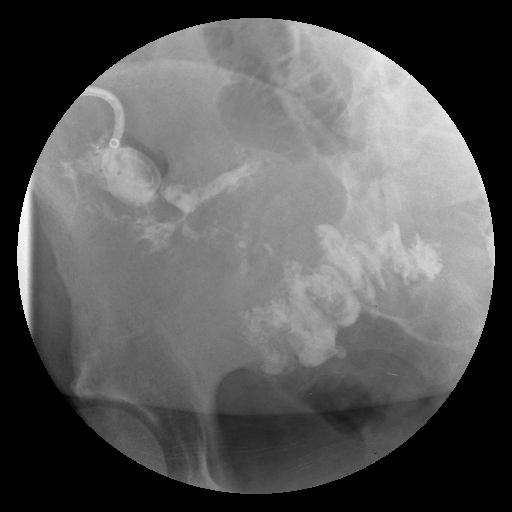

[Series 6: run · 1 of 1 slices shown (6 of 12)]
[im 1/1]
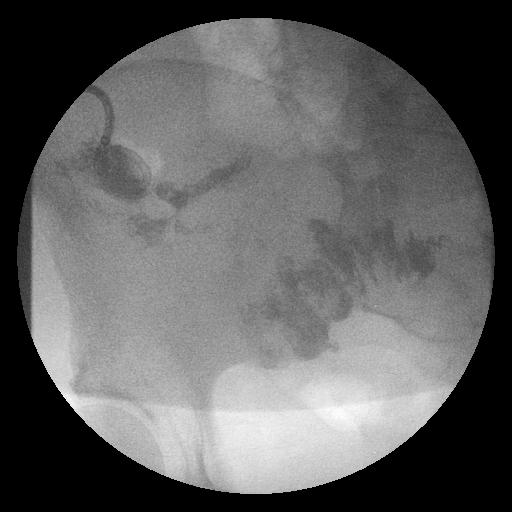

[Series 7: run · 1 of 1 slices shown (7 of 12)]
[im 1/1]
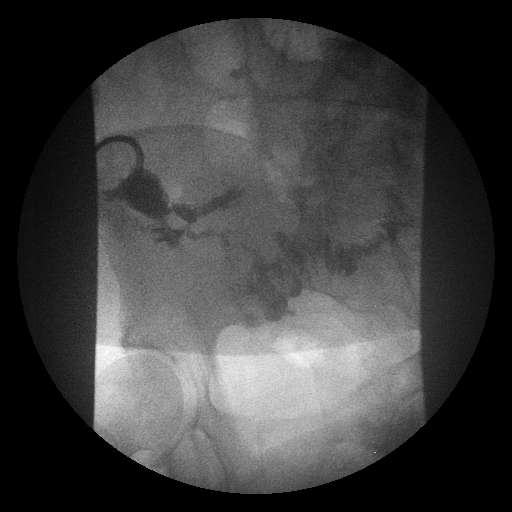

[Series 8: run · 1 of 1 slices shown (8 of 12)]
[im 1/1]
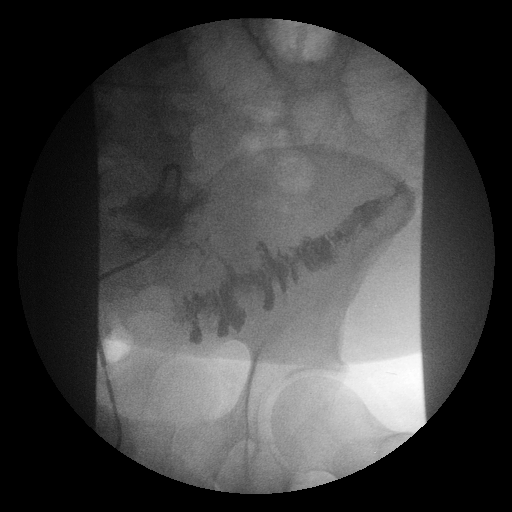

[Series 9: run · 1 of 1 slices shown (9 of 12)]
[im 1/1]
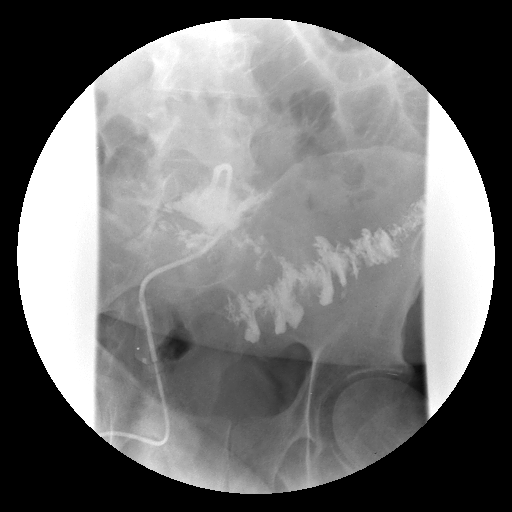

[Series 10: run · 1 of 1 slices shown (10 of 12)]
[im 1/1]
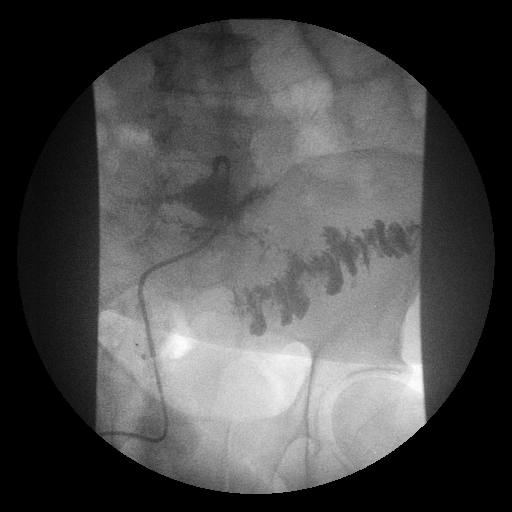

[Series 11: run · 1 of 1 slices shown (11 of 12)]
[im 1/1]
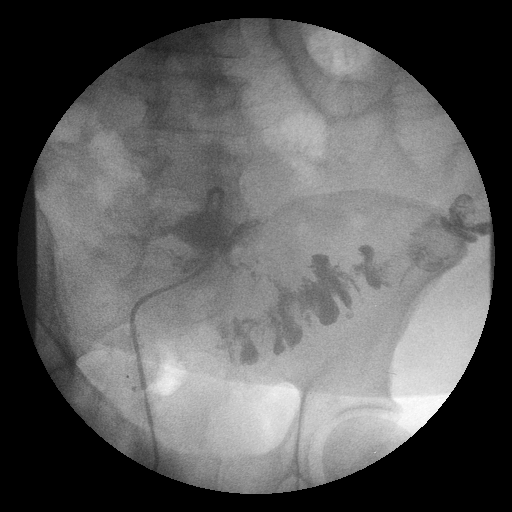

[Series 12: run · 1 of 1 slices shown (12 of 12)]
[im 1/1]
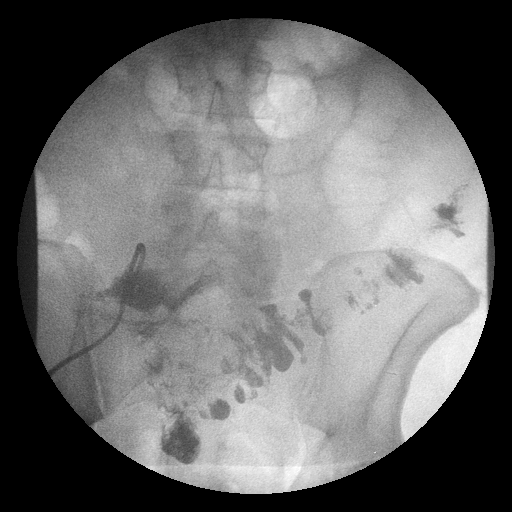

[12 of 12 positions shown; findings below may reference images not displayed]

FINDINGS: Gentle injection of contrast under fluoroscopic
monitoring, fills a small pocket surrounding the catheter.
However, there is almost immediate fistulization from this cavity,
into the sigmoid colon through a fairly long fistulous tract.
IMPRESSION: The abscess cavity communicates with the sigmoid colon.

## 2010-07-24 LAB — DIFFERENTIAL
Basophils Absolute: 0 10*3/uL (ref 0.0–0.1)
Basophils Absolute: 0 10*3/uL (ref 0.0–0.1)
Basophils Relative: 0 % (ref 0–1)
Basophils Relative: 0 % (ref 0–1)
Eosinophils Absolute: 0.1 10*3/uL (ref 0.0–0.7)
Eosinophils Absolute: 0.2 10*3/uL (ref 0.0–0.7)
Eosinophils Relative: 0 % (ref 0–5)
Eosinophils Relative: 1 % (ref 0–5)
Lymphocytes Relative: 14 % (ref 12–46)
Lymphocytes Relative: 5 % — ABNORMAL LOW (ref 12–46)
Lymphocytes Relative: 8 % — ABNORMAL LOW (ref 12–46)
Lymphs Abs: 1.4 10*3/uL (ref 0.7–4.0)
Lymphs Abs: 1.5 10*3/uL (ref 0.7–4.0)
Monocytes Absolute: 0.4 10*3/uL (ref 0.1–1.0)
Monocytes Absolute: 0.8 10*3/uL (ref 0.1–1.0)
Monocytes Absolute: 0.9 10*3/uL (ref 0.1–1.0)
Monocytes Absolute: 1.1 10*3/uL — ABNORMAL HIGH (ref 0.1–1.0)
Monocytes Relative: 5 % (ref 3–12)
Neutrophils Relative %: 85 % — ABNORMAL HIGH (ref 43–77)
Neutrophils Relative %: 86 % — ABNORMAL HIGH (ref 43–77)

## 2010-07-24 LAB — CBC
HCT: 30.9 % — ABNORMAL LOW (ref 39.0–52.0)
HCT: 31.7 % — ABNORMAL LOW (ref 39.0–52.0)
HCT: 31.8 % — ABNORMAL LOW (ref 39.0–52.0)
HCT: 33.1 % — ABNORMAL LOW (ref 39.0–52.0)
HCT: 33.5 % — ABNORMAL LOW (ref 39.0–52.0)
HCT: 39.2 % (ref 39.0–52.0)
HCT: 42.1 % (ref 39.0–52.0)
HCT: 43.5 % (ref 39.0–52.0)
HCT: 47.9 % (ref 39.0–52.0)
Hemoglobin: 10.8 g/dL — ABNORMAL LOW (ref 13.0–17.0)
Hemoglobin: 11 g/dL — ABNORMAL LOW (ref 13.0–17.0)
Hemoglobin: 11.2 g/dL — ABNORMAL LOW (ref 13.0–17.0)
Hemoglobin: 11.4 g/dL — ABNORMAL LOW (ref 13.0–17.0)
Hemoglobin: 12.4 g/dL — ABNORMAL LOW (ref 13.0–17.0)
Hemoglobin: 13.3 g/dL (ref 13.0–17.0)
Hemoglobin: 14.4 g/dL (ref 13.0–17.0)
Hemoglobin: 14.9 g/dL (ref 13.0–17.0)
Hemoglobin: 16.2 g/dL (ref 13.0–17.0)
MCHC: 33.2 g/dL (ref 30.0–36.0)
MCHC: 33.6 g/dL (ref 30.0–36.0)
MCHC: 33.7 g/dL (ref 30.0–36.0)
MCHC: 34 g/dL (ref 30.0–36.0)
MCHC: 34.1 g/dL (ref 30.0–36.0)
MCHC: 34.2 g/dL (ref 30.0–36.0)
MCHC: 34.7 g/dL (ref 30.0–36.0)
MCV: 100.8 fL — ABNORMAL HIGH (ref 78.0–100.0)
MCV: 100.9 fL — ABNORMAL HIGH (ref 78.0–100.0)
MCV: 101 fL — ABNORMAL HIGH (ref 78.0–100.0)
MCV: 101.6 fL — ABNORMAL HIGH (ref 78.0–100.0)
MCV: 101.6 fL — ABNORMAL HIGH (ref 78.0–100.0)
MCV: 101.6 fL — ABNORMAL HIGH (ref 78.0–100.0)
MCV: 101.9 fL — ABNORMAL HIGH (ref 78.0–100.0)
Platelets: 270 10*3/uL (ref 150–400)
Platelets: 299 10*3/uL (ref 150–400)
Platelets: 351 10*3/uL (ref 150–400)
Platelets: 407 10*3/uL — ABNORMAL HIGH (ref 150–400)
RBC: 3.26 MIL/uL — ABNORMAL LOW (ref 4.22–5.81)
RBC: 3.31 MIL/uL — ABNORMAL LOW (ref 4.22–5.81)
RBC: 3.65 MIL/uL — ABNORMAL LOW (ref 4.22–5.81)
RBC: 3.76 MIL/uL — ABNORMAL LOW (ref 4.22–5.81)
RBC: 4.19 MIL/uL — ABNORMAL LOW (ref 4.22–5.81)
RBC: 4.31 MIL/uL (ref 4.22–5.81)
RDW: 16.4 % — ABNORMAL HIGH (ref 11.5–15.5)
RDW: 16.6 % — ABNORMAL HIGH (ref 11.5–15.5)
RDW: 16.6 % — ABNORMAL HIGH (ref 11.5–15.5)
RDW: 16.7 % — ABNORMAL HIGH (ref 11.5–15.5)
RDW: 16.9 % — ABNORMAL HIGH (ref 11.5–15.5)
RDW: 16.9 % — ABNORMAL HIGH (ref 11.5–15.5)
RDW: 17.2 % — ABNORMAL HIGH (ref 11.5–15.5)
RDW: 17.3 % — ABNORMAL HIGH (ref 11.5–15.5)
WBC: 10.2 10*3/uL (ref 4.0–10.5)
WBC: 10.8 10*3/uL — ABNORMAL HIGH (ref 4.0–10.5)
WBC: 12.1 10*3/uL — ABNORMAL HIGH (ref 4.0–10.5)
WBC: 12.9 10*3/uL — ABNORMAL HIGH (ref 4.0–10.5)
WBC: 16.9 10*3/uL — ABNORMAL HIGH (ref 4.0–10.5)

## 2010-07-24 LAB — COMPREHENSIVE METABOLIC PANEL
ALT: 10 U/L (ref 0–53)
ALT: 10 U/L (ref 0–53)
ALT: 9 U/L (ref 0–53)
AST: 16 U/L (ref 0–37)
AST: 18 U/L (ref 0–37)
AST: 19 U/L (ref 0–37)
Albumin: 1.7 g/dL — ABNORMAL LOW (ref 3.5–5.2)
Albumin: 1.9 g/dL — ABNORMAL LOW (ref 3.5–5.2)
Albumin: 1.9 g/dL — ABNORMAL LOW (ref 3.5–5.2)
Alkaline Phosphatase: 44 U/L (ref 39–117)
BUN: 20 mg/dL (ref 6–23)
BUN: 28 mg/dL — ABNORMAL HIGH (ref 6–23)
BUN: 7 mg/dL (ref 6–23)
CO2: 26 mEq/L (ref 19–32)
CO2: 27 mEq/L (ref 19–32)
Calcium: 7.8 mg/dL — ABNORMAL LOW (ref 8.4–10.5)
Calcium: 8 mg/dL — ABNORMAL LOW (ref 8.4–10.5)
Calcium: 8 mg/dL — ABNORMAL LOW (ref 8.4–10.5)
Calcium: 8.4 mg/dL (ref 8.4–10.5)
Chloride: 103 mEq/L (ref 96–112)
Chloride: 105 mEq/L (ref 96–112)
Creatinine, Ser: 0.85 mg/dL (ref 0.4–1.5)
Creatinine, Ser: 1.32 mg/dL (ref 0.4–1.5)
Creatinine, Ser: 1.52 mg/dL — ABNORMAL HIGH (ref 0.4–1.5)
Creatinine, Ser: 1.77 mg/dL — ABNORMAL HIGH (ref 0.4–1.5)
GFR calc Af Amer: >60 mL/min (ref >60)
GFR calc Af Amer: >60 mL/min (ref >60)
GFR calc non Af Amer: 59 mL/min — ABNORMAL LOW (ref >60)
GFR calc non Af Amer: >60 mL/min (ref >60)
Glucose, Bld: 102 mg/dL — ABNORMAL HIGH (ref 70–99)
Glucose, Bld: 84 mg/dL (ref 70–99)
Potassium: 4.3 mEq/L (ref 3.5–5.1)
Sodium: 136 mEq/L (ref 135–145)
Sodium: 140 mEq/L (ref 135–145)
Total Bilirubin: 0.5 mg/dL (ref 0.3–1.2)
Total Protein: 6.1 g/dL (ref 6.0–8.3)
Total Protein: 6.2 g/dL (ref 6.0–8.3)

## 2010-07-24 LAB — GLUCOSE, CAPILLARY
Glucose-Capillary: 101 mg/dL — ABNORMAL HIGH (ref 70–99)
Glucose-Capillary: 101 mg/dL — ABNORMAL HIGH (ref 70–99)
Glucose-Capillary: 108 mg/dL — ABNORMAL HIGH (ref 70–99)
Glucose-Capillary: 110 mg/dL — ABNORMAL HIGH (ref 70–99)
Glucose-Capillary: 110 mg/dL — ABNORMAL HIGH (ref 70–99)
Glucose-Capillary: 117 mg/dL — ABNORMAL HIGH (ref 70–99)
Glucose-Capillary: 117 mg/dL — ABNORMAL HIGH (ref 70–99)
Glucose-Capillary: 118 mg/dL — ABNORMAL HIGH (ref 70–99)
Glucose-Capillary: 135 mg/dL — ABNORMAL HIGH (ref 70–99)
Glucose-Capillary: 77 mg/dL (ref 70–99)
Glucose-Capillary: 92 mg/dL (ref 70–99)
Glucose-Capillary: 99 mg/dL (ref 70–99)

## 2010-07-24 LAB — BASIC METABOLIC PANEL
BUN: 11 mg/dL (ref 6–23)
BUN: 15 mg/dL (ref 6–23)
BUN: 7 mg/dL (ref 6–23)
CO2: 23 mEq/L (ref 19–32)
CO2: 24 mEq/L (ref 19–32)
CO2: 26 mEq/L (ref 19–32)
Calcium: 7.5 mg/dL — ABNORMAL LOW (ref 8.4–10.5)
Calcium: 7.6 mg/dL — ABNORMAL LOW (ref 8.4–10.5)
Calcium: 7.7 mg/dL — ABNORMAL LOW (ref 8.4–10.5)
Calcium: 9.3 mg/dL (ref 8.4–10.5)
Chloride: 100 mEq/L (ref 96–112)
Chloride: 101 mEq/L (ref 96–112)
Creatinine, Ser: 0.73 mg/dL (ref 0.4–1.5)
GFR calc Af Amer: >60 mL/min (ref >60)
GFR calc Af Amer: >60 mL/min (ref >60)
GFR calc Af Amer: >60 mL/min (ref >60)
GFR calc Af Amer: >60 mL/min (ref >60)
GFR calc non Af Amer: >60 mL/min (ref >60)
GFR calc non Af Amer: >60 mL/min (ref >60)
GFR calc non Af Amer: >60 mL/min (ref >60)
Glucose, Bld: 115 mg/dL — ABNORMAL HIGH (ref 70–99)
Glucose, Bld: 122 mg/dL — ABNORMAL HIGH (ref 70–99)
Glucose, Bld: 126 mg/dL — ABNORMAL HIGH (ref 70–99)
Potassium: 3 mEq/L — ABNORMAL LOW (ref 3.5–5.1)
Potassium: 3.1 mEq/L — ABNORMAL LOW (ref 3.5–5.1)
Potassium: 3.4 mEq/L — ABNORMAL LOW (ref 3.5–5.1)
Potassium: 3.6 mEq/L (ref 3.5–5.1)
Potassium: 3.9 mEq/L (ref 3.5–5.1)
Sodium: 133 mEq/L — ABNORMAL LOW (ref 135–145)
Sodium: 134 mEq/L — ABNORMAL LOW (ref 135–145)
Sodium: 135 mEq/L (ref 135–145)
Sodium: 136 mEq/L (ref 135–145)
Sodium: 139 mEq/L (ref 135–145)

## 2010-07-24 LAB — CHOLESTEROL, TOTAL: Cholesterol: 91 mg/dL (ref 0–200)

## 2010-07-24 LAB — PHOSPHORUS
Phosphorus: 4.9 mg/dL — ABNORMAL HIGH (ref 2.3–4.6)
Phosphorus: 5.3 mg/dL — ABNORMAL HIGH (ref 2.3–4.6)

## 2010-07-24 LAB — TRIGLYCERIDES: Triglycerides: 137 mg/dL (ref <150)

## 2010-07-24 LAB — TISSUE CULTURE

## 2010-07-24 LAB — CREATININE, SERUM
Creatinine, Ser: 0.8 mg/dL (ref 0.4–1.5)
GFR calc non Af Amer: >60 mL/min (ref >60)

## 2010-07-24 LAB — MAGNESIUM: Magnesium: 2.2 mg/dL (ref 1.5–2.5)

## 2010-07-24 LAB — PREALBUMIN: Prealbumin: 3.6 mg/dL — ABNORMAL LOW (ref 18.0–45.0)

## 2010-07-27 LAB — COMPREHENSIVE METABOLIC PANEL
ALT: 29 U/L (ref 0–53)
ALT: 40 U/L (ref 0–53)
ALT: 44 U/L (ref 0–53)
AST: 26 U/L (ref 0–37)
AST: 31 U/L (ref 0–37)
Albumin: 3.1 g/dL — ABNORMAL LOW (ref 3.5–5.2)
Albumin: 3.2 g/dL — ABNORMAL LOW (ref 3.5–5.2)
Alkaline Phosphatase: 48 U/L (ref 39–117)
BUN: 18 mg/dL (ref 6–23)
BUN: 6 mg/dL (ref 6–23)
CO2: 24 mEq/L (ref 19–32)
CO2: 27 mEq/L (ref 19–32)
CO2: 27 mEq/L (ref 19–32)
Calcium: 8.7 mg/dL (ref 8.4–10.5)
Calcium: 8.9 mg/dL (ref 8.4–10.5)
Calcium: 9.2 mg/dL (ref 8.4–10.5)
Calcium: 9.3 mg/dL (ref 8.4–10.5)
Chloride: 102 mEq/L (ref 96–112)
Chloride: 103 mEq/L (ref 96–112)
Creatinine, Ser: 0.65 mg/dL (ref 0.4–1.5)
Creatinine, Ser: 0.71 mg/dL (ref 0.4–1.5)
GFR calc Af Amer: >60 mL/min (ref >60)
GFR calc Af Amer: >60 mL/min (ref >60)
GFR calc non Af Amer: >60 mL/min (ref >60)
GFR calc non Af Amer: >60 mL/min (ref >60)
Glucose, Bld: 94 mg/dL (ref 70–99)
Potassium: 3.8 mEq/L (ref 3.5–5.1)
Sodium: 134 mEq/L — ABNORMAL LOW (ref 135–145)
Sodium: 138 mEq/L (ref 135–145)
Sodium: 139 mEq/L (ref 135–145)
Total Protein: 7.3 g/dL (ref 6.0–8.3)

## 2010-07-27 LAB — DIFFERENTIAL
Basophils Absolute: 0.1 10*3/uL (ref 0.0–0.1)
Basophils Relative: 1 % (ref 0–1)
Eosinophils Absolute: 0.1 10*3/uL (ref 0.0–0.7)
Eosinophils Absolute: 0.2 10*3/uL (ref 0.0–0.7)
Eosinophils Relative: 2 % (ref 0–5)
Eosinophils Relative: 3 % (ref 0–5)
Lymphocytes Relative: 22 % (ref 12–46)
Lymphocytes Relative: 26 % (ref 12–46)
Lymphs Abs: 2.7 10*3/uL (ref 0.7–4.0)
Lymphs Abs: 2.7 10*3/uL (ref 0.7–4.0)
Monocytes Absolute: 0.8 10*3/uL (ref 0.1–1.0)
Monocytes Absolute: 0.8 10*3/uL (ref 0.1–1.0)
Monocytes Relative: 8 % (ref 3–12)
Neutrophils Relative %: 70 % (ref 43–77)

## 2010-07-27 LAB — PHOSPHORUS: Phosphorus: 4.6 mg/dL (ref 2.3–4.6)

## 2010-07-27 LAB — CBC
HCT: 39.5 % (ref 39.0–52.0)
HCT: 39.9 % (ref 39.0–52.0)
HCT: 40.9 % (ref 39.0–52.0)
HCT: 41.1 % (ref 39.0–52.0)
HCT: 41.7 % (ref 39.0–52.0)
HCT: 41.7 % (ref 39.0–52.0)
Hemoglobin: 13.4 g/dL (ref 13.0–17.0)
Hemoglobin: 13.8 g/dL (ref 13.0–17.0)
Hemoglobin: 14.1 g/dL (ref 13.0–17.0)
Hemoglobin: 14.2 g/dL (ref 13.0–17.0)
Hemoglobin: 14.3 g/dL (ref 13.0–17.0)
Hemoglobin: 14.4 g/dL (ref 13.0–17.0)
Hemoglobin: 14.6 g/dL (ref 13.0–17.0)
Hemoglobin: 14.7 g/dL (ref 13.0–17.0)
MCHC: 34 g/dL (ref 30.0–36.0)
MCHC: 34.4 g/dL (ref 30.0–36.0)
MCHC: 34.5 g/dL (ref 30.0–36.0)
MCHC: 34.5 g/dL (ref 30.0–36.0)
MCHC: 34.5 g/dL (ref 30.0–36.0)
MCV: 103.4 fL — ABNORMAL HIGH (ref 78.0–100.0)
MCV: 104.1 fL — ABNORMAL HIGH (ref 78.0–100.0)
MCV: 104.2 fL — ABNORMAL HIGH (ref 78.0–100.0)
MCV: 104.6 fL — ABNORMAL HIGH (ref 78.0–100.0)
Platelets: 233 10*3/uL (ref 150–400)
Platelets: 244 10*3/uL (ref 150–400)
Platelets: 284 10*3/uL (ref 150–400)
Platelets: 344 10*3/uL (ref 150–400)
RBC: 3.97 MIL/uL — ABNORMAL LOW (ref 4.22–5.81)
RBC: 3.98 MIL/uL — ABNORMAL LOW (ref 4.22–5.81)
RBC: 4 MIL/uL — ABNORMAL LOW (ref 4.22–5.81)
RBC: 4.03 MIL/uL — ABNORMAL LOW (ref 4.22–5.81)
RBC: 4.04 MIL/uL — ABNORMAL LOW (ref 4.22–5.81)
RBC: 4.1 MIL/uL — ABNORMAL LOW (ref 4.22–5.81)
RDW: 13 % (ref 11.5–15.5)
RDW: 13.1 % (ref 11.5–15.5)
RDW: 14 % (ref 11.5–15.5)
RDW: 14.1 % (ref 11.5–15.5)
WBC: 10.2 10*3/uL (ref 4.0–10.5)
WBC: 12.9 10*3/uL — ABNORMAL HIGH (ref 4.0–10.5)
WBC: 7.7 10*3/uL (ref 4.0–10.5)
WBC: 8.3 10*3/uL (ref 4.0–10.5)

## 2010-07-27 LAB — BASIC METABOLIC PANEL
BUN: 16 mg/dL (ref 6–23)
BUN: 18 mg/dL (ref 6–23)
CO2: 27 mEq/L (ref 19–32)
CO2: 27 mEq/L (ref 19–32)
Calcium: 9 mg/dL (ref 8.4–10.5)
Calcium: 9 mg/dL (ref 8.4–10.5)
Calcium: 9.2 mg/dL (ref 8.4–10.5)
Calcium: 9.3 mg/dL (ref 8.4–10.5)
Creatinine, Ser: 0.7 mg/dL (ref 0.4–1.5)
Creatinine, Ser: 0.75 mg/dL (ref 0.4–1.5)
GFR calc Af Amer: >60 mL/min (ref >60)
GFR calc Af Amer: >60 mL/min (ref >60)
GFR calc non Af Amer: >60 mL/min (ref >60)
GFR calc non Af Amer: >60 mL/min (ref >60)
GFR calc non Af Amer: >60 mL/min (ref >60)
Glucose, Bld: 106 mg/dL — ABNORMAL HIGH (ref 70–99)
Glucose, Bld: 98 mg/dL (ref 70–99)
Potassium: 3.9 mEq/L (ref 3.5–5.1)
Potassium: 3.9 mEq/L (ref 3.5–5.1)
Sodium: 136 mEq/L (ref 135–145)
Sodium: 137 mEq/L (ref 135–145)
Sodium: 139 mEq/L (ref 135–145)

## 2010-07-27 LAB — GLUCOSE, CAPILLARY
Glucose-Capillary: 101 mg/dL — ABNORMAL HIGH (ref 70–99)
Glucose-Capillary: 103 mg/dL — ABNORMAL HIGH (ref 70–99)
Glucose-Capillary: 104 mg/dL — ABNORMAL HIGH (ref 70–99)
Glucose-Capillary: 105 mg/dL — ABNORMAL HIGH (ref 70–99)
Glucose-Capillary: 110 mg/dL — ABNORMAL HIGH (ref 70–99)
Glucose-Capillary: 116 mg/dL — ABNORMAL HIGH (ref 70–99)
Glucose-Capillary: 119 mg/dL — ABNORMAL HIGH (ref 70–99)
Glucose-Capillary: 121 mg/dL — ABNORMAL HIGH (ref 70–99)
Glucose-Capillary: 126 mg/dL — ABNORMAL HIGH (ref 70–99)
Glucose-Capillary: 127 mg/dL — ABNORMAL HIGH (ref 70–99)
Glucose-Capillary: 87 mg/dL (ref 70–99)
Glucose-Capillary: 89 mg/dL (ref 70–99)
Glucose-Capillary: 91 mg/dL (ref 70–99)
Glucose-Capillary: 95 mg/dL (ref 70–99)
Glucose-Capillary: 96 mg/dL (ref 70–99)

## 2010-07-27 LAB — FOLATE RBC: RBC Folate: 367 ng/mL (ref 180–600)

## 2010-07-27 LAB — MAGNESIUM
Magnesium: 1.9 mg/dL (ref 1.5–2.5)
Magnesium: 2 mg/dL (ref 1.5–2.5)

## 2010-07-27 LAB — PREALBUMIN
Prealbumin: 15.1 mg/dL — ABNORMAL LOW (ref 18.0–45.0)
Prealbumin: 17.8 mg/dL — ABNORMAL LOW (ref 18.0–45.0)

## 2010-07-27 LAB — CHOLESTEROL, TOTAL: Cholesterol: 99 mg/dL (ref 0–200)

## 2010-07-27 LAB — TRIGLYCERIDES: Triglycerides: 175 mg/dL — ABNORMAL HIGH (ref <150)

## 2010-07-28 LAB — DIFFERENTIAL
Basophils Absolute: 0 10*3/uL (ref 0.0–0.1)
Basophils Absolute: 0 10*3/uL (ref 0.0–0.1)
Basophils Absolute: 0.1 10*3/uL (ref 0.0–0.1)
Basophils Relative: 0 % (ref 0–1)
Basophils Relative: 0 % (ref 0–1)
Basophils Relative: 0 % (ref 0–1)
Eosinophils Absolute: 0 10*3/uL (ref 0.0–0.7)
Eosinophils Absolute: 0.1 10*3/uL (ref 0.0–0.7)
Eosinophils Absolute: 0.1 10*3/uL (ref 0.0–0.7)
Eosinophils Absolute: 0.1 10*3/uL (ref 0.0–0.7)
Eosinophils Relative: 0 % (ref 0–5)
Eosinophils Relative: 0 % (ref 0–5)
Eosinophils Relative: 1 % (ref 0–5)
Eosinophils Relative: 2 % (ref 0–5)
Lymphocytes Relative: 15 % (ref 12–46)
Lymphocytes Relative: 9 % — ABNORMAL LOW (ref 12–46)
Lymphs Abs: 1.2 10*3/uL (ref 0.7–4.0)
Lymphs Abs: 1.8 10*3/uL (ref 0.7–4.0)
Lymphs Abs: 1.9 10*3/uL (ref 0.7–4.0)
Lymphs Abs: 2.1 10*3/uL (ref 0.7–4.0)
Monocytes Absolute: 1.3 10*3/uL — ABNORMAL HIGH (ref 0.1–1.0)
Monocytes Absolute: 1.6 10*3/uL — ABNORMAL HIGH (ref 0.1–1.0)
Monocytes Relative: 8 % (ref 3–12)
Monocytes Relative: 9 % (ref 3–12)
Neutro Abs: 11.5 10*3/uL — ABNORMAL HIGH (ref 1.7–7.7)
Neutro Abs: 15 10*3/uL — ABNORMAL HIGH (ref 1.7–7.7)
Neutrophils Relative %: 77 % (ref 43–77)
Neutrophils Relative %: 80 % — ABNORMAL HIGH (ref 43–77)
Neutrophils Relative %: 85 % — ABNORMAL HIGH (ref 43–77)

## 2010-07-28 LAB — BASIC METABOLIC PANEL
BUN: 11 mg/dL (ref 6–23)
BUN: 11 mg/dL (ref 6–23)
CO2: 29 mEq/L (ref 19–32)
Calcium: 8.5 mg/dL (ref 8.4–10.5)
Calcium: 8.5 mg/dL (ref 8.4–10.5)
Calcium: 9 mg/dL (ref 8.4–10.5)
Chloride: 99 mEq/L (ref 96–112)
Creatinine, Ser: 0.83 mg/dL (ref 0.4–1.5)
Creatinine, Ser: 0.89 mg/dL (ref 0.4–1.5)
Creatinine, Ser: 0.95 mg/dL (ref 0.4–1.5)
GFR calc Af Amer: >60 mL/min (ref >60)
GFR calc Af Amer: >60 mL/min (ref >60)
GFR calc Af Amer: >60 mL/min (ref >60)
GFR calc non Af Amer: >60 mL/min (ref >60)
GFR calc non Af Amer: >60 mL/min (ref >60)
GFR calc non Af Amer: >60 mL/min (ref >60)
Glucose, Bld: 109 mg/dL — ABNORMAL HIGH (ref 70–99)
Glucose, Bld: 92 mg/dL (ref 70–99)
Potassium: 2.8 mEq/L — ABNORMAL LOW (ref 3.5–5.1)
Potassium: 3.6 mEq/L (ref 3.5–5.1)
Sodium: 133 mEq/L — ABNORMAL LOW (ref 135–145)
Sodium: 138 mEq/L (ref 135–145)
Sodium: 139 mEq/L (ref 135–145)

## 2010-07-28 LAB — CBC
HCT: 43.1 % (ref 39.0–52.0)
HCT: 46.3 % (ref 39.0–52.0)
HCT: 49.1 % (ref 39.0–52.0)
Hemoglobin: 14.9 g/dL (ref 13.0–17.0)
Hemoglobin: 16 g/dL (ref 13.0–17.0)
Hemoglobin: 17.5 g/dL — ABNORMAL HIGH (ref 13.0–17.0)
Hemoglobin: 15.6 g/dL (ref 13.0–17.0)
MCHC: 34.5 g/dL (ref 30.0–36.0)
MCHC: 35.1 g/dL (ref 30.0–36.0)
MCHC: 35.6 g/dL (ref 30.0–36.0)
MCV: 103.4 fL — ABNORMAL HIGH (ref 78.0–100.0)
MCV: 103.8 fL — ABNORMAL HIGH (ref 78.0–100.0)
MCV: 104.6 fL — ABNORMAL HIGH (ref 78.0–100.0)
MCV: 105 fL — ABNORMAL HIGH (ref 78.0–100.0)
Platelets: 243 10*3/uL (ref 150–400)
Platelets: 244 10*3/uL (ref 150–400)
RBC: 4.03 MIL/uL — ABNORMAL LOW (ref 4.22–5.81)
RBC: 4.09 MIL/uL — ABNORMAL LOW (ref 4.22–5.81)
RBC: 4.12 MIL/uL — ABNORMAL LOW (ref 4.22–5.81)
RBC: 4.75 MIL/uL (ref 4.22–5.81)
RBC: 4.28 MIL/uL (ref 4.22–5.81)
RDW: 14.2 % (ref 11.5–15.5)
RDW: 14.2 % (ref 11.5–15.5)
WBC: 14.5 10*3/uL — ABNORMAL HIGH (ref 4.0–10.5)
WBC: 14.9 10*3/uL — ABNORMAL HIGH (ref 4.0–10.5)
WBC: 18.4 10*3/uL — ABNORMAL HIGH (ref 4.0–10.5)
WBC: 18.8 10*3/uL — ABNORMAL HIGH (ref 4.0–10.5)

## 2010-07-28 LAB — URINALYSIS, ROUTINE W REFLEX MICROSCOPIC
Ketones, ur: NEGATIVE mg/dL
Leukocytes, UA: NEGATIVE
Nitrite: NEGATIVE
Specific Gravity, Urine: 1.015 (ref 1.005–1.030)
Urobilinogen, UA: 1 mg/dL (ref 0.0–1.0)
pH: 8.5 — ABNORMAL HIGH (ref 5.0–8.0)

## 2010-07-28 LAB — COMPREHENSIVE METABOLIC PANEL
ALT: 14 U/L (ref 0–53)
AST: 15 U/L (ref 0–37)
AST: 41 U/L — ABNORMAL HIGH (ref 0–37)
BUN: 7 mg/dL (ref 6–23)
CO2: 27 mEq/L (ref 19–32)
CO2: 28 mEq/L (ref 19–32)
Calcium: 9 mg/dL (ref 8.4–10.5)
Chloride: 97 mEq/L (ref 96–112)
Chloride: 98 mEq/L (ref 96–112)
Creatinine, Ser: 0.87 mg/dL (ref 0.4–1.5)
GFR calc Af Amer: >60 mL/min (ref >60)
GFR calc Af Amer: >60 mL/min (ref >60)
GFR calc non Af Amer: >60 mL/min (ref >60)
GFR calc non Af Amer: >60 mL/min (ref >60)
Glucose, Bld: 118 mg/dL — ABNORMAL HIGH (ref 70–99)
Sodium: 133 mEq/L — ABNORMAL LOW (ref 135–145)
Total Bilirubin: 0.8 mg/dL (ref 0.3–1.2)
Total Bilirubin: 1.4 mg/dL — ABNORMAL HIGH (ref 0.3–1.2)

## 2010-07-28 LAB — CULTURE, ROUTINE-ABSCESS

## 2010-07-28 LAB — LIPASE, BLOOD: Lipase: 12 U/L (ref 11–59)

## 2010-07-31 LAB — DIFFERENTIAL
Basophils Absolute: 0.1 10*3/uL (ref 0.0–0.1)
Eosinophils Relative: 1 % (ref 0–5)
Lymphocytes Relative: 23 % (ref 12–46)
Monocytes Absolute: 1.1 10*3/uL — ABNORMAL HIGH (ref 0.1–1.0)
Monocytes Relative: 11 % (ref 3–12)
Neutro Abs: 6.6 10*3/uL (ref 1.7–7.7)

## 2010-07-31 LAB — BASIC METABOLIC PANEL
CO2: 30 mEq/L (ref 19–32)
GFR calc Af Amer: >60 mL/min (ref >60)
GFR calc non Af Amer: >60 mL/min (ref >60)
Glucose, Bld: 87 mg/dL (ref 70–99)
Potassium: 3.6 mEq/L (ref 3.5–5.1)
Sodium: 138 mEq/L (ref 135–145)

## 2010-07-31 LAB — CBC
HCT: 49.2 % (ref 39.0–52.0)
Hemoglobin: 17.4 g/dL — ABNORMAL HIGH (ref 13.0–17.0)
MCHC: 35.4 g/dL (ref 30.0–36.0)
RBC: 4.55 MIL/uL (ref 4.22–5.81)
RDW: 14.4 % (ref 11.5–15.5)

## 2010-07-31 LAB — POCT CARDIAC MARKERS
CKMB, poc: <1 ng/mL — ABNORMAL LOW (ref 1.0–8.0)
Myoglobin, poc: 60.3 ng/mL (ref 12–200)

## 2010-09-05 NOTE — Procedures (Signed)
[name], [name]                 ACCOUNT NO.:  [id number]   MEDICAL RECORD NO.:  [id number]          PATIENT TYPE:  OUT   LOCATION:  SLEE                          FACILITY:  [facility]   PHYSICIAN:  Kofi A. Gerilyn Pilgrim, M.D. DATE OF BIRTH:  1966-02-23   DATE OF PROCEDURE:  11/03/2007  DATE OF DISCHARGE:  11/03/2007                             SLEEP DISORDER REPORT   INDICATION:  This is a 45 year old who presents with snoring and  insomnia.  He has been evaluated for obstructive sleep apnea syndrome.   MEDICATIONS:  Allegra, potassium, Diovan, AndroGel, Proventil, Mobic,  and Diovan/HCT.   Epworth Sleepiness Scale of 7.  BMI of 42.  Age of study, 65.   SLEEP STAGE SUMMARY:  The patient met criteria for split night study.  The first part was a diagnostic and the second part a titration study.  The total recording time during the diagnostic is 163 minutes.  The  titration is 270 minutes.  The sleep latency is 25 minutes.  There is no  REM latency observed in the diagnostic portion.  The REM latency during  the titration is 60 minutes.   RESPIRATORY SUMMARY:  This is a split night study.  The first half  showed an AHI of 108.  The patient was subsequently titrated between  pressures of 5 and 14.  The optimal pressure was 14 with resolution of  apneic events.  The pressure had to be reduced.  During the recording,  the patient had it significant leaks, but this was corrected.  The  baseline saturation is 97% and lowest saturation is 85%.   LIMB MOVEMENT SUMMARY:  PLM index 0.   ELECTROCARDIOGRAM SUMMARY:  Average heart rate is 68.  Frequent PVCs  were observed, especially during the diagnostic portion.  This improved,  but there was still residual PVCs observed with the positive pressure  portion.   IMPRESSION:  Severe obstructive sleep apnea syndrome, which responded to  a CPAP of 14.      Kofi A. Gerilyn Pilgrim, M.D.  Electronically Signed     KAD/MEDQ  D:  11/07/2007  T:   11/08/2007  Job:  [id number]

## 2010-09-08 NOTE — Discharge Summary (Signed)
[name], [name]                 ACCOUNT NO.:  [id number]   MEDICAL RECORD NO.:  [id number]          PATIENT TYPE:  INP   LOCATION:  A311                          FACILITY:  [facility]   PHYSICIAN:  Jerolyn Shin C. Katrinka Blazing, M.D.   DATE OF BIRTH:  10/01/1965   DATE OF ADMISSION:  04/06/2004  DATE OF DISCHARGE:  12/19/2005LH                                 DISCHARGE SUMMARY   DISCHARGE DIAGNOSES:  1.  Acute diverticulitis.  2.  Hypertension.   DISPOSITION:  The patient is discharged home in stable, improved condition.   DISCHARGE MEDICATIONS:  1.  Atenolol 50 mg daily.  2.  Cipro 500 mg b.i.d.  3.  Doxycycline 100 mg b.i.d.  4.  Tylox one q.4h. p.r.n. pain.  5.  The patient is scheduled to be seen in the office in two weeks.   SUMMARY:  A 45 year old male admitted for treatment of acute abdomen.  He  gives a history of acute onset of severe left lower-quadrant abdominal pain.  He was seen in the clinic.  He was started on Cipro.  His symptoms became  worse.  He had two episodes of rectal bleeding on the day prior to  admission.  He was seen in the emergency room where he was noted to have a  white count of 19,500.  CT scan showed evidence of acute inflammation of the  mid sigmoid colon with diffuse soft-tissue thickening, and adjacent  pericolonic soft-tissue stranding.  There was a small amount of extraluminal  air within the central pelvis with no free peritoneal air.  The patient also  had extensive sigmoid colon diverticulosis.  He was admitted for  nonoperative treatment.  The patient gives a history of having two episodes  of rectal bleeding in the past.  It was felt that because of this, he would  need to have colonoscopy after the acute episode was abated.   PAST MEDICAL HISTORY:  Unremarkable except for hypertension.   PHYSICAL EXAMINATION:  VITAL SIGNS:  Blood pressure 110/60, pulse 80,  respirations 26, temperature 97.8.  ABDOMEN:  Exam revealed moderate tenderness to gentle  palpation in the left  lower-quadrant, and extending down into the suprapubic area. No masses were  felt. He had good, active bowel sounds.   The patient was admitted.  He was started on Zosyn, gentamicin and Flagyl.  He responded appropriately to treatment.  His white count decreased and was  down to 10,000 on December 18.  By the 19th, he was much better.  He had  some diarrhea with bleeding.  He had hypokalemia which was treated with oral  potassium.  Repeat CT of the abdomen was done, and this showed  diverticulitis without significant change.  Since the patient was  asymptomatic,  he was discharged home, but was advised that he needs to take his  antibiotics for two weeks, and that he needs to have colonoscopy after the  acute episode is abated.   DISCHARGE CONDITION:  He was discharged home in satisfactory condition.      LCS/MEDQ  D:  05/08/2004  T:  05/21/2004  Job:  0

## 2010-09-08 NOTE — H&P (Signed)
[name], [name]                 ACCOUNT NO.:  [id number]   MEDICAL RECORD NO.:  [id number]          PATIENT TYPE:  INP   LOCATION:  A311                          FACILITY:  [facility]   PHYSICIAN:  Jerolyn Shin C. Katrinka Blazing, M.D.   DATE OF BIRTH:  [birth year]   DATE OF ADMISSION:  04/06/2004  DATE OF DISCHARGE:  LH                                HISTORY & PHYSICAL   A 45 year old male admitted for treatment of acute abdomen with presumed  diverticulitis.  The patient gives a history of having onset of severe left  lower quadrant abdominal pain since Tuesday.  He did not have nausea,  vomiting.  He was seen in the clinic at the [facility] on  Wednesday and was started on Cipro.  His symptoms continued and the pain  became much worse.  He had two episodes of rectal bleeding on Wednesday and  the day of admission.  The patient was seen in the emergency room because of  severe discomfort.  He did not have nausea or vomiting.  He had lab work  which showed a white count of 19,500.  He had a CT scan which showed  evidence of acute inflammation of the mid sigmoid with diffuse soft tissue  thickening and adjacent pericolonic soft tissue stranding.  There was a  small amount of extraluminal air within the central pelvis but no free  peritoneal air.  He also had multiple sigmoid colon diverticula.  The  patient is admitted with the plan to try to treat him non-operatively since  he has localized disease without abscess formation.  The patient gives a  history of having two episodes of rectal bleeding and this has been  separated over about a two week period.  Because of this, he will need to  have followup colonoscopy after the acute episode is over.  History reveals  that he had a similar episode about three years ago which was treated with  antibiotics orally x 7 days.  He has not had any attacks since then.   PAST HISTORY:  He has hypertension and takes atenolol 50 mg a day. He also  takes Cipro 500 mg b.i.d., but he has only been taking this for two days.   SURGERY:  Cholecystectomy.   ALLERGIES:  None.   FAMILY HISTORY:  Positive for hypertension and atherosclerotic heart  disease.   SOCIAL HISTORY:  He is divorced.  He has two children, ages 21 and 30.  He  smokes a pack of cigarettes per day.  He drinks at least a pint of liquor  per day, and he drinks beer on a daily basis.  He denies drug use.  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   REVIEW OF SYSTEMS:  Unremarkable except for pain in his knees which is  aggravated with walking.   PHYSICAL EXAMINATION:  GENERAL:  He is lying quietly in bed.  He is very  pleasant.  VITAL SIGNS:  Blood pressure is 110/60, pulse 80, respirations 26,  temperature 97.8.  O2 saturation is  98% on room air.  HEENT:  Unremarkable.  NECK:  Thick, supple, nontender.  No masses.  No adenopathy, thyromegaly, or  jugular venous distention.  CHEST:  Clear to auscultation.  No rales, rubs, rhonchi, or wheezes.  HEART:  Regular rate and rhythm without murmur, gallop or rub.  ABDOMEN:  Soft with moderate tenderness to gentle palpation in the left  lower quadrant especially along the sigmoid colon extending down into the  suprapubic area.  No masses are felt but he does have guarding in the left  hypogastrium.  He has good active bowel sounds.  EXTREMITIES:  No cyanosis, clubbing, or edema.  No joint deformity.  NEUROLOGIC:  No focal motor, sensory, or cerebellar deficit.   IMPRESSION:  1.  Acute diverticulitis.  2.  Hypertension.   PLAN:  1.  The patient will be treated with IV Zosyn, gentamicin, and Flagyl.  2.  His leukocytosis will be followed.  3.  If he continues to improve, we will get a followup CT of the abdomen on      Monday.  If he should worsen, he is advised that he will probably      operative therapy.     Lero   LCS/MEDQ  D:  04/07/2004  T:  04/07/2004  Job:  [id number]

## 2010-10-24 ENCOUNTER — Encounter (INDEPENDENT_AMBULATORY_CARE_PROVIDER_SITE_OTHER): Payer: PRIVATE HEALTH INSURANCE | Admitting: Surgery

## 2010-11-06 ENCOUNTER — Encounter (INDEPENDENT_AMBULATORY_CARE_PROVIDER_SITE_OTHER): Payer: Self-pay | Admitting: Surgery

## 2010-11-06 ENCOUNTER — Ambulatory Visit (INDEPENDENT_AMBULATORY_CARE_PROVIDER_SITE_OTHER): Payer: PRIVATE HEALTH INSURANCE | Admitting: Surgery

## 2010-11-06 VITALS — BP 148/92 | HR 70 | Ht 71.0 in | Wt 305.0 lb

## 2010-11-06 DIAGNOSIS — F17200 Nicotine dependence, unspecified, uncomplicated: Secondary | ICD-10-CM

## 2010-11-06 DIAGNOSIS — K432 Incisional hernia without obstruction or gangrene: Secondary | ICD-10-CM

## 2010-11-06 NOTE — Progress Notes (Signed)
Subjective:     Patient ID: [name], male   DOB: [birth year], 45 y.o.   MRN: [id number]  HPI  The patient had increased swelling around his bellybutton. It's worse in the left lower side. He called noting swelling in January. We offered to see him. I was worried about hernia. He was wanted to wait. He comes now 6 months later.   He gets more pain on the right side than the larger left lump. He is eating well. He no longer has diarrhea. He is back to work. He continues to smoke. He comes today with his wife.  Review of Systems  Constitutional: Negative for fever, chills, diaphoresis and appetite change.  HENT: Negative for nosebleeds, sore throat, facial swelling, mouth sores, trouble swallowing and ear discharge.   Eyes: Negative for photophobia, discharge and visual disturbance.  Respiratory: Negative for choking, chest tightness, shortness of breath and stridor.   Cardiovascular: Negative for chest pain and palpitations.  Gastrointestinal: Positive for abdominal pain. Negative for nausea, vomiting, diarrhea, constipation, blood in stool, abdominal distention, anal bleeding and rectal pain.  Genitourinary: Negative for dysuria, urgency, difficulty urinating and testicular pain.  Musculoskeletal: Negative for myalgias, back pain, arthralgias and gait problem.  Skin: Negative for color change, pallor, rash and wound.  Neurological: Negative for dizziness, speech difficulty, weakness, numbness and headaches.  Hematological: Negative for adenopathy. Does not bruise/bleed easily.  Psychiatric/Behavioral: Negative for hallucinations, confusion and agitation.       Objective:   Physical Exam  Constitutional: He is oriented to person, place, and time. He appears well-developed and well-nourished. No distress.       Smells of tobacco  HENT:  Head: Normocephalic.  Mouth/Throat: Oropharynx is clear and moist. No oropharyngeal exudate.  Eyes: Conjunctivae and EOM are normal. Pupils are  equal, round, and reactive to light. No scleral icterus.  Neck: Normal range of motion. Neck supple. No tracheal deviation present.  Cardiovascular: Normal rate, regular rhythm and intact distal pulses.   Pulmonary/Chest: Effort normal and breath sounds normal. No respiratory distress.  Abdominal: Soft. Bowel sounds are normal. He exhibits mass. He exhibits no distension. There is tenderness. There is no rebound and no guarding. Hernia confirmed negative in the right inguinal area and confirmed negative in the left inguinal area.       Melon sized mass LLQ c/w large hernia.  Soft.  Partially reducible.  Smaller ~3cm VWH R paramedian  Musculoskeletal: Normal range of motion. He exhibits no tenderness.  Lymphadenopathy:    He has no cervical adenopathy.       Right: No inguinal adenopathy present.       Left: No inguinal adenopathy present.  Neurological: He is alert and oriented to person, place, and time. No cranial nerve deficit. He exhibits normal muscle tone. Coordination normal.  Skin: Skin is warm and dry. No rash noted. He is not diaphoretic. No erythema. No pallor.  Psychiatric: He has a normal mood and affect. His behavior is normal. Judgment and thought content normal.       Assessment:     Large ventral hernia x2 in an obese smoker.   Plan:     Had a long discussion with the patient his wife. He needs to stop smoking. This is the biggest risk factor for any surgery.  It may be good to do it laparoscopically in order to avoid infection. However his defect may be too large for this and may require more of an open component separation. Perhaps  combined approach will be needed. However if he does not stop smoking, I will not operate on him. I offered him second opinions. I am quite certain at other institutions will agree [facility], [facility], [place], etc.)   He's gained 20 pounds in 2 years. I strongly recommended he try and lose weight. I am skeptical that is going to  happen.  He did not like these answers. His wife understands. He will think about things and let me know. I am worried that they are gotten quite large in the 6 months since he started complaining about it.  I stressed that I'm trying to minimize his risks for surgery. He already had a very round throat with his complicated diverticulitis. I worried a longer await the worst is is going to get, so I think we are stuck.  I will urine nicotine on day of surgery.  If it's positive I would have to cancel surgery. I need him to be compliant. He understands.

## 2010-11-06 NOTE — Patient Instructions (Signed)
STOP SMOKING!!!  Consider surgery for Hernias - see handout

## 2010-11-24 ENCOUNTER — Telehealth (INDEPENDENT_AMBULATORY_CARE_PROVIDER_SITE_OTHER): Payer: Self-pay

## 2010-11-24 NOTE — Telephone Encounter (Signed)
Pt's girlfriend Pam called to find out about getting pt scheduled for surgery b/c he has quit smoking for 2wks. I need to speak to Dr Michaell Cowing to find out about writing the orders./ [facility]

## 2010-11-29 ENCOUNTER — Telehealth (INDEPENDENT_AMBULATORY_CARE_PROVIDER_SITE_OTHER): Payer: Self-pay

## 2010-11-29 NOTE — Telephone Encounter (Signed)
Called pt to notify him that Dr Michaell Cowing completed the surgical orders and I was going to take his info to the schdulers so they can call pt to schedule his surgery. I did ask the pt if he still was done with the cigarettes and he said he has not had a smoke since his last office visit with Dr Michaell Cowing. The pt is taking Nicotene pills to help quit smoking. I told the pt that Dr Michaell Cowing will do a urine nicotene study on him 5 days before the surgery and if positive the surgery will be delayed. I advised the pt that I would double check with Dr Michaell Cowing about the pills b/c he will probably need to be off the pills too before surgery./ [facility]

## 2010-11-29 NOTE — Telephone Encounter (Signed)
Called pt back to tell him that DR Gross does want him off the nicotense pills before surgery. The pt will try to get off the pills 2wks before surgery.[facility]

## 2011-01-12 ENCOUNTER — Ambulatory Visit (HOSPITAL_COMMUNITY)
Admission: RE | Admit: 2011-01-12 | Discharge: 2011-01-12 | Disposition: A | Discharge status: Home / Self Care | Payer: PRIVATE HEALTH INSURANCE | Source: Ambulatory Visit | Attending: Surgery | Admitting: Surgery

## 2011-01-12 ENCOUNTER — Other Ambulatory Visit (INDEPENDENT_AMBULATORY_CARE_PROVIDER_SITE_OTHER): Payer: Self-pay | Admitting: Surgery

## 2011-01-12 ENCOUNTER — Telehealth (INDEPENDENT_AMBULATORY_CARE_PROVIDER_SITE_OTHER): Payer: Self-pay

## 2011-01-12 ENCOUNTER — Encounter (HOSPITAL_COMMUNITY): Payer: PRIVATE HEALTH INSURANCE

## 2011-01-12 DIAGNOSIS — Z01812 Encounter for preprocedural laboratory examination: Secondary | ICD-10-CM | POA: Insufficient documentation

## 2011-01-12 DIAGNOSIS — Z01811 Encounter for preprocedural respiratory examination: Secondary | ICD-10-CM | POA: Insufficient documentation

## 2011-01-12 DIAGNOSIS — Z01810 Encounter for preprocedural cardiovascular examination: Secondary | ICD-10-CM | POA: Insufficient documentation

## 2011-01-12 DIAGNOSIS — Z01818 Encounter for other preprocedural examination: Secondary | ICD-10-CM

## 2011-01-12 DIAGNOSIS — K439 Ventral hernia without obstruction or gangrene: Secondary | ICD-10-CM | POA: Insufficient documentation

## 2011-01-12 LAB — BASIC METABOLIC PANEL
CO2: 28 mEq/L (ref 19–32)
Chloride: 101 mEq/L (ref 96–112)
Glucose, Bld: 101 mg/dL — ABNORMAL HIGH (ref 70–99)
Potassium: 3.8 mEq/L (ref 3.5–5.1)
Sodium: 138 mEq/L (ref 135–145)

## 2011-01-12 LAB — CBC
Hemoglobin: 14.3 g/dL (ref 13.0–17.0)
Platelets: 189 10*3/uL (ref 150–400)
RBC: 3.67 MIL/uL — ABNORMAL LOW (ref 4.22–5.81)
WBC: 10.7 10*3/uL — ABNORMAL HIGH (ref 4.0–10.5)

## 2011-01-12 LAB — SURGICAL PCR SCREEN
MRSA, PCR: NEGATIVE
Staphylococcus aureus: NEGATIVE

## 2011-01-12 NOTE — Telephone Encounter (Signed)
Per Darlene at [facility] presurgical, they didn't catch the order until today for urine nicotene.  It takes 3 days to get results and it's processed in [place] only on Monday, Wednesday and Friday.  If they draw a serum, it takes 4 days turn around time.  I spoke to Rosey Bath in the lab [phone] and she states if they do the urine test it will not be shipped out until Monday and we may not get results until the following Monday.  I paged Dr Michaell Cowing, he states he will not cut any corners and we need to postpone the surgery.  I notified Darlene and I told Dennie Bible E in surgery scheduling.  She has postponed the surgery until 10/4.  We will have the pt call and speak to [facility].

## 2011-01-13 LAB — URINE DRUGS OF ABUSE SCREEN W ALC, ROUTINE (REF LAB)
Amphetamine Screen, Ur: NEGATIVE
Barbiturate Quant, Ur: NEGATIVE
Creatinine,U: 164.3 mg/dL
Ethyl Alcohol: <10 mg/dL (ref <10)
Marijuana Metabolite: NEGATIVE
Methadone: NEGATIVE
Phencyclidine (PCP): NEGATIVE

## 2011-01-16 LAB — DIFFERENTIAL
Eosinophils Absolute: 0.3
Eosinophils Relative: 3
Lymphs Abs: 2.9
Monocytes Relative: 9

## 2011-01-16 LAB — COMPREHENSIVE METABOLIC PANEL
ALT: 58 — ABNORMAL HIGH
AST: 85 — ABNORMAL HIGH
Alkaline Phosphatase: 53
CO2: 29
Calcium: 9.3
GFR calc Af Amer: >60
GFR calc non Af Amer: >60
Potassium: 3.6
Sodium: 138

## 2011-01-16 LAB — B-NATRIURETIC PEPTIDE (CONVERTED LAB): Pro B Natriuretic peptide (BNP): <30

## 2011-01-16 LAB — CBC
MCHC: 34.9
RBC: 3.82 — ABNORMAL LOW
WBC: 10.4

## 2011-01-16 LAB — POCT CARDIAC MARKERS
CKMB, poc: 1
CKMB, poc: <1 — ABNORMAL LOW
Myoglobin, poc: 74.6
Myoglobin, poc: 78.8
Operator id: 207241
Troponin i, poc: <0.05

## 2011-01-23 ENCOUNTER — Telehealth (INDEPENDENT_AMBULATORY_CARE_PROVIDER_SITE_OTHER): Payer: Self-pay

## 2011-01-23 DIAGNOSIS — R197 Diarrhea, unspecified: Secondary | ICD-10-CM

## 2011-01-23 MED ORDER — METRONIDAZOLE 500 MG PO TABS: 500.0000 mg | ORAL_TABLET | Freq: Four times a day (QID) | ORAL | Status: DC

## 2011-01-23 NOTE — Telephone Encounter (Signed)
Called pt to notify him that we received his nicotine results and they were ok for his scheduled surgery for 01-25-11.[name]

## 2011-01-23 NOTE — Telephone Encounter (Signed)
Error wrong pt/ [facility]

## 2011-01-25 ENCOUNTER — Inpatient Hospital Stay (HOSPITAL_COMMUNITY)
Admission: AD | Admit: 2011-01-25 | Discharge: 2011-01-28 | DRG: 336 | Disposition: A | Discharge status: Home / Self Care | Payer: PRIVATE HEALTH INSURANCE | Source: Ambulatory Visit | Attending: Surgery | Admitting: Surgery

## 2011-01-25 DIAGNOSIS — K430 Incisional hernia with obstruction, without gangrene: Principal | ICD-10-CM | POA: Diagnosis present

## 2011-01-25 DIAGNOSIS — J449 Chronic obstructive pulmonary disease, unspecified: Secondary | ICD-10-CM | POA: Diagnosis present

## 2011-01-25 DIAGNOSIS — K5730 Diverticulosis of large intestine without perforation or abscess without bleeding: Secondary | ICD-10-CM | POA: Diagnosis present

## 2011-01-25 DIAGNOSIS — Z87891 Personal history of nicotine dependence: Secondary | ICD-10-CM

## 2011-01-25 DIAGNOSIS — Z01812 Encounter for preprocedural laboratory examination: Secondary | ICD-10-CM

## 2011-01-25 DIAGNOSIS — G4733 Obstructive sleep apnea (adult) (pediatric): Secondary | ICD-10-CM | POA: Diagnosis present

## 2011-01-25 DIAGNOSIS — Y831 Surgical operation with implant of artificial internal device as the cause of abnormal reaction of the patient, or of later complication, without mention of misadventure at the time of the procedure: Secondary | ICD-10-CM | POA: Diagnosis not present

## 2011-01-25 DIAGNOSIS — IMO0002 Reserved for concepts with insufficient information to code with codable children: Secondary | ICD-10-CM | POA: Diagnosis not present

## 2011-01-25 DIAGNOSIS — K660 Peritoneal adhesions (postprocedural) (postinfection): Secondary | ICD-10-CM | POA: Diagnosis present

## 2011-01-25 DIAGNOSIS — Z01810 Encounter for preprocedural cardiovascular examination: Secondary | ICD-10-CM

## 2011-01-25 DIAGNOSIS — Y9210 Unspecified residential institution as the place of occurrence of the external cause: Secondary | ICD-10-CM | POA: Diagnosis not present

## 2011-01-25 DIAGNOSIS — K436 Other and unspecified ventral hernia with obstruction, without gangrene: Secondary | ICD-10-CM

## 2011-01-25 DIAGNOSIS — M109 Gout, unspecified: Secondary | ICD-10-CM | POA: Diagnosis present

## 2011-01-25 DIAGNOSIS — J4489 Other specified chronic obstructive pulmonary disease: Secondary | ICD-10-CM | POA: Diagnosis present

## 2011-01-25 HISTORY — PX: HERNIA REPAIR: SHX51

## 2011-01-25 LAB — URINALYSIS, ROUTINE W REFLEX MICROSCOPIC
Glucose, UA: 250 mg/dL — AB
Specific Gravity, Urine: 1.021 (ref 1.005–1.030)
pH: 6.5 (ref 5.0–8.0)

## 2011-01-25 LAB — URINE MICROSCOPIC-ADD ON

## 2011-01-26 LAB — URINE CULTURE: Special Requests: NEGATIVE

## 2011-01-27 LAB — POTASSIUM: Potassium: 4.7 mEq/L (ref 3.5–5.1)

## 2011-01-27 LAB — CREATININE, SERUM: GFR calc Af Amer: >90 mL/min (ref >90)

## 2011-02-07 ENCOUNTER — Encounter (INDEPENDENT_AMBULATORY_CARE_PROVIDER_SITE_OTHER): Payer: Self-pay | Admitting: Surgery

## 2011-02-07 ENCOUNTER — Ambulatory Visit (INDEPENDENT_AMBULATORY_CARE_PROVIDER_SITE_OTHER): Payer: PRIVATE HEALTH INSURANCE | Admitting: Surgery

## 2011-02-07 VITALS — BP 124/84 | HR 64 | Temp 96.2°F | Resp 20 | Ht 71.0 in | Wt 299.5 lb

## 2011-02-07 DIAGNOSIS — K432 Incisional hernia without obstruction or gangrene: Secondary | ICD-10-CM | POA: Insufficient documentation

## 2011-02-07 HISTORY — DX: Incisional hernia without obstruction or gangrene: K43.2

## 2011-02-07 NOTE — Progress Notes (Signed)
Subjective:     Patient ID: [name], male   DOB: 01/19/1966, 45 y.o.   MRN: [id number]  HPI  Patient Care Team: [name] as PCP - General (Internal Medicine)  This patient is a 45 y.o.male who presents today for surgical evaluation.   Diagnosis: Incisional ventral wall hernias x9 (infraumbilical swiss cheese x8, R abd x1 old ileostomy)  Procedure: Laparoscopic repair with large mesh x2 on 01/25/2011  Patient is feeling better. Weaned off narcotics. Feels pulling st left>right groins. Occasionally soreness right lateral at old ileostomy site. Regular bowel movements. Using an abdominal  binder. Using Aleve for pain. He comes in with his wife. He is still avoiding smoking. They wish to drive for 5 hours for a trip in [place] next week. He is already driving.  Past Medical History  Diagnosis Date  . Hypertension   . Diverticulitis     Past Surgical History  Procedure Date  . Colon surgery 04/15/2009  . Appendectomy 04/15/2009  . Laparoscopic cholecystectomy   . Hernia repair 01/25/11    lap ventral hernia repair x9    History   Social History  . Marital Status: Single    Spouse Name: N/A    Number of Children: N/A  . Years of Education: N/A   Occupational History  . Not on file.   Social History Main Topics  . Smoking status: Current Everyday Smoker -- 1.0 packs/day for 30 years    Types: Cigarettes  . Smokeless tobacco: Never Used  . Alcohol Use: Yes  . Drug Use: No  . Sexually Active: Yes -- Male partner(s)   Other Topics Concern  . Not on file   Social History Narrative  . No narrative on file    History reviewed. No pertinent family history.  Current outpatient prescriptions:allopurinol (ZYLOPRIM) 300 MG tablet, , Disp: , Rfl: ;  naproxen sodium (ANAPROX) 220 MG tablet, Take 220 mg by mouth 2 (two) times daily with a meal.  , Disp: , Rfl: ;  omeprazole (PRILOSEC) 40 MG capsule, Take 40 mg by mouth daily.  , Disp: , Rfl: ;  potassium chloride  (KLOR-CON) 10 MEQ CR tablet, Take 10 mEq by mouth daily.  , Disp: , Rfl:   No Known Allergies     Review of Systems  Constitutional: Negative for fever, chills and diaphoresis.  HENT: Negative for nosebleeds, sore throat, facial swelling, mouth sores, trouble swallowing and ear discharge.   Eyes: Negative for photophobia, discharge and visual disturbance.  Respiratory: Negative for choking, chest tightness, shortness of breath and stridor.   Cardiovascular: Negative for chest pain and palpitations.  Gastrointestinal: Positive for abdominal pain. Negative for nausea, vomiting, diarrhea, constipation, blood in stool, abdominal distention, anal bleeding and rectal pain.  Genitourinary: Negative for dysuria, urgency, frequency, flank pain, penile swelling, difficulty urinating, penile pain and testicular pain.  Musculoskeletal: Negative for myalgias, back pain, arthralgias and gait problem.  Skin: Negative for color change, pallor, rash and wound.  Neurological: Negative for dizziness, speech difficulty, weakness, numbness and headaches.  Hematological: Negative for adenopathy. Does not bruise/bleed easily.  Psychiatric/Behavioral: Negative for hallucinations, confusion and agitation.       Objective:   Physical Exam  Constitutional: He is oriented to person, place, and time. He appears well-developed and well-nourished. No distress.  HENT:  Head: Normocephalic.  Mouth/Throat: Oropharynx is clear and moist. No oropharyngeal exudate.  Eyes: Conjunctivae and EOM are normal. Pupils are equal, round, and reactive to light. No scleral icterus.  Neck: Normal range of motion. No tracheal deviation present.  Cardiovascular: Normal rate, normal heart sounds and intact distal pulses.   Pulmonary/Chest: Effort normal. No respiratory distress.  Abdominal: Soft. He exhibits no distension. There is no tenderness. Hernia confirmed negative in the right inguinal area and confirmed negative in the left  inguinal area.       Dressings still on - I removed.  Incisions clean with normal healing ridges.  No hernias.  LLQ large seroma from largest hernia site.  No cellulitis.  Musculoskeletal: Normal range of motion. He exhibits no tenderness.  Neurological: He is alert and oriented to person, place, and time. No cranial nerve deficit. He exhibits normal muscle tone. Coordination normal.  Skin: Skin is warm and dry. No rash noted. He is not diaphoretic.  Psychiatric: He has a normal mood and affect. His behavior is normal.       Assessment:     POD # 13 s/p Lap VWH repair of numerous hernias, recovering rather well so far     Plan:     Increase activity as tolerated.  Do not push through pain.  Aleve and Heat and Binder for discomfort  Advanced on diet as tolerated. Bowel regimen to avoid problems.  Return to clinic 3 weeks.  He may change to p.r.n if pain is gone. OK for trip next week.  He & his wife expressed understanding and appreciation

## 2011-02-08 NOTE — Op Note (Signed)
[name], Brian NO.:  [id number]  MEDICAL RECORD NO.:  [id number]  LOCATION:  1536                         FACILITY:  [facility]  PHYSICIAN:  Ardeth Sportsman, MD     DATE OF BIRTH:  01/17/1966  DATE OF PROCEDURE: DATE OF DISCHARGE:                              OPERATIVE REPORT   PRIMARY CARE PHYSICIAN:  Catalina Pizza, M.D.  SURGEON:  Ardeth Sportsman, MD.  ASSISTANT:  RN.  PREOPERATIVE DIAGNOSIS:  Ventral incisional hernia x2.  POSTOPERATIVE DIAGNOSIS:  Ventral incisional hernia x9, swiss cheese.  PROCEDURE PERFORMED: 1. Laparoscopic lysis of adhesions x60 minutes (equals one-third of     case). 2. Laparoscopic ventral hernia repairs with mesh.  ANESTHESIA: 1. General anesthesia. 2. Local anesthetic and a field block around all puncture sites. 3. On-Q continuous bupivacaine pump.  SPECIMEN:  None.  DRAINS:  None.  ESTIMATED BLOOD LOSS:  50 mL  COMPLICATIONS:  None.  MAJOR INDICATIONS:  [name] is a 45 year old morbidly obese former smoker who developed complex perforated diverticulitis that took numerous operations to control including developing an abscess, fistula leak, and eventually recovered.  He noted about 6 months ago, he began to have some bulging and it worsened.  He came to see me and he had obvious infraumbilical ventral hernia and probable one on the right midline as well.  The pathophysiology of herniation with its natural history, risks of incarceration, strangulation were discussed.  Options discussed and recommendation made for laparoscopic versus open repair with mesh. Because of his smoking, I insisted that he quit and he did.  Urine nicotine was negative.  Techniques, risks, benefits, were discussed. Risk of small bowel injury, enterocutaneous fistula, recurrent hernia, and such were discussed.  Need for considered effort for weight loss to avoid recurrence and continue to avoid smoking or stress as well. Questions answered  and he agreed to proceed.  OPERATIVE FINDINGS:  He had a small ventral hernia incarcerated with omentum at his old ileostomy site in his right mid abdomen.  He had a region of swiss cheese hernias, about 8 fold and a region of 15 x 11 cm primarily infraumbilically, but paraumbilically along his mid line with the largest going into the left lower quadrant and the next largest in the right lower quadrant.  He had some laxity in the suprapubic region, otherwise not certain it was a definite hernia as well.  The hernias were incarcerated with omentum but not small bowel.  DESCRIPTION OF PROCEDURE:  Informed consent was confirmed.  Patient underwent general anesthesia without any difficulty.  Because of his prior history of MRSA, he was given vancomycin and gentamicin for synergy.  A Foley catheter was sterilely placed.  He was positioned supine with arms tucked.  His abdomen and mons pubis were clipped and prepped and draped in the sterile fashion.  Surgical time-out confirmed our plan.  I placed a #5 mm port in the left upper quadrant using optical entry technique with the patient in steep reverse Trendelenburg and left side up since this was away from all the major surgeries.  Entry was cleaned. I induced carbon dioxide insufflation.  Camera inspection revealed no  injury.  He had mostly thin, but a fair volume of adhesions to greater omentum into the anterior abdominal wall.  I placed a 5 mm port in the left flank.  I placed another one in the left lateral lower quadrant just above the iliac crest.  I did sharp dissection with cold scissors. Indication was focussed there just to release greater omentum off the anterior abdominal wall, so I got to the obvious incarcerated hernias. I had used some cautery on the scissors to help reduce that down with some manual counter reduction.  Eventually, I reduced the hernias that had moderate wispy greater omentum.  Some were quite pedunculated  and thinned out with potential internal hernias.  I did transect some of the thinned out in greater omentum and removed it out with side ports.  I noted the defects and measured them out.  I chose a 30x20 cm Parietex/Seprafilm mesh to avoid new adhesions.  I rolled in the abdomen and laid it obliquely with a corner resting just over the anterior superior iliac spine on the left side and that would be the lateral corner and then the medial corner was resting in the suprapubic region just below the pubis.  I continued with the bladder and it was deep in the true pelvis below the pubis and well decompressed.  I laid that up obliquely towards the right upper area.  I came over towards the right side.  It allowed at least 10 cm circumferential coverage inferiorly, left laterally, and left superolaterally.  I secured it to anterior abdominal wall using interrupted Prolene stitches x18.  I later put a couple of stitches in the left upper quadrant and left lower quadrant to help secure little more centrally close to the mesh.  It became obvious that I would need more coverage over the old ileostomy site as well as better lateral coverage in the right lower quadrant.  I therefore chose a 20x15 cm Parietex/Seprafilm dual side mesh.  I rolled that into the abdomen, I had done this through the ileostomy hernia site through a separate stab incision.  I secured that to the anterior abdominal wall using #1 Prolene interrupted sutures around the edge x12. I had placed a couple of 5-0 ports over the right iliac crest and over the right subcostal region near the anterior axillary line as well.  With that I had over 10 cm of circumferential coverage around all hernia defects except for the ileostomy, but that was much smaller, only by 4 x 4 cm while the central region was 15 x 11 with some central bridging forming a big swiss cheese honeycomb pattern.  Eventually, I had a good coverage over that.  I put  a couple of more central stitches using laparoscopic suture passer.  There had been little bit of oozing off the greater omentum, but there was no active bleeding during the case.  I did run the small bowel from ligament of Treitz to the ileocecal valve and happily there were no interloop adhesions and the ileal anastomosis was viable with no abnormalities.  I did some irrigation with clear return.  I sutured the mesh in the anterior abdominal wall using a Secure strap absorbable tackers x2.  Primarily, I did around peripherally and centrally.  The meshes did overlap in the right paramedial region.  I paid special attention to make sure there were good stitch to help hold that up intact and help form them together as well.  I used the ON-Q  Tunnelers to run along the right subcostal ridge down towards the posterior axillary line around the posterior superior iliac spine and same thing on the left side using a laparoscopic observation. They were placed in the subfascial preperitoneal region.  I evacuated the carbon dioxide and removed the 5 mm ports.  The larger 10 mm port was through the ileostomy site and that was covered.  I closed the port sites using Monocryl.  I closed the puncture sites for the stitches with Steri-Strips.  I placed the ON-Q catheters inside the sheath and peeled away the sheaths.  Sterile dressing was applied.  The patient was being extubated and sent to recovery room.  I will monitor him at least overnight although given his morbidities and length of case and extent of mesh, I think he probably will need to stay several days, but we will see.     Ardeth Sportsman, MD     SCG/MEDQ  D:  01/25/2011  T:  01/26/2011  Job:  [id number]  cc:   Catalina Pizza, M.D. Fax: [phone]  Electronically Signed by Karie Soda MD on 02/08/2011 11:06:22 AM

## 2011-02-08 NOTE — Discharge Summary (Signed)
  [name], DOLLEY NO.:  [id number]  MEDICAL RECORD NO.:  [id number]  LOCATION:  1536                         FACILITY:  [facility]  PHYSICIAN:  Ardeth Sportsman, MD     DATE OF BIRTH:  08/02/1965  DATE OF ADMISSION:  01/25/2011 DATE OF DISCHARGE:  01/28/2011                              DISCHARGE SUMMARY   SURGEON:  Ardeth Sportsman, MD  FINAL DIAGNOSES:  Ventral hernia x9 primarily, periumbilical and right lateral.  PROCEDURES PERFORMED: 1. Laparoscopic lysis of adhesions. 2. Ventral hernia repairs with mesh.  MEDICATIONS:  Noted in the chart.  OTHER DIAGNOSES:  Include: 1. Tobacco abuse, recently resolved. 2. Diverticulitis with perforation and fistula, resolved.  HOSPITAL COURSE:  Mr. Hanken is a 45 year old morbid obese male who had complex perforated diverticulitis with abscesses and fistulae that required numerous surgeries and operations to recover, but ultimately he did develop a hernia.  He was having increasing size and symptoms. After counseling and documentation of smoking cessation, I went ahead and did laparoscopic repair of these hernias with large pieces of mesh.  Postoperatively, he was placed on IV fluids, and started him on liquids. He was immobilized.  He was initially on IV pain meds, but transitioned over to oral medications.  He had a good pain control with oral medication On-Q continuous bupivacaine pump and other interventions.  At the time of discharge, he was walking well in the hallways, he was tolerating p.o. well.  His pain was adequately controlled with oral medications.  He was having flatus.  Based on improvements, he can be discharged home on the following instructions. 1. He is to return to clinic to see me in a few weeks. 2. He should call if he has worsening fevers, chills, sweats, nausea,     vomiting, uncontrolled pain, diarrhea, or other concerns. 3. He should continue his home medications which are noted in the    system. 4. He should ice and heat, and oral narcotics and oral over-the-     counter pain medications in combination and his On-Q pump, he can     remove later when it is fully collapsed and he was instructed on     this.     Ardeth Sportsman, MD     SCG/MEDQ  D:  01/29/2011  T:  01/29/2011  Job:  [id number]  Electronically Signed by Karie Soda MD on 02/08/2011 11:06:16 AM

## 2011-02-14 ENCOUNTER — Encounter (INDEPENDENT_AMBULATORY_CARE_PROVIDER_SITE_OTHER): Payer: Self-pay

## 2011-03-05 ENCOUNTER — Ambulatory Visit (INDEPENDENT_AMBULATORY_CARE_PROVIDER_SITE_OTHER): Payer: PRIVATE HEALTH INSURANCE | Admitting: Surgery

## 2011-03-05 ENCOUNTER — Encounter (INDEPENDENT_AMBULATORY_CARE_PROVIDER_SITE_OTHER): Payer: Self-pay | Admitting: Surgery

## 2011-03-05 VITALS — BP 134/84 | HR 64 | Temp 96.0°F | Resp 16 | Ht 71.0 in | Wt 294.5 lb

## 2011-03-05 DIAGNOSIS — K432 Incisional hernia without obstruction or gangrene: Secondary | ICD-10-CM

## 2011-03-05 DIAGNOSIS — IMO0002 Reserved for concepts with insufficient information to code with codable children: Secondary | ICD-10-CM | POA: Insufficient documentation

## 2011-03-05 NOTE — Progress Notes (Signed)
Subjective:     Patient ID: [name], male   DOB: 09/25/1965, 45 y.o.   MRN: [id number]  HPI  Patient Care Team: Dwana Melena as PCP - General (Internal Medicine)  This patient is a 45 y.o.male who presents today for surgical evaluation.   Diagnosis: Incisional ventral wall hernias x9 (infraumbilical swiss cheese x8, R abd x1 old ileostomy)   Procedure: Laparoscopic repair with large mesh x2 on 01/25/2011  Patient comes back today feeling a little better. Off narcotics. Still using heat and Naprosyn. Sharp disorder is in bilateral lower quadrants. Large seroma left lower quadrant about the same size. Eating well. Current exercise more.  He is concerned about going back to work and given the heavy lifting hands. We had to amount to Thanksgiving. No nausea or vomiting. Energy level is slowly returning. Binder helps.   Past Medical History  Diagnosis Date  . Hypertension   . Diverticulitis     Past Surgical History  Procedure Date  . Colon surgery 04/15/2009  . Appendectomy 04/15/2009  . Laparoscopic cholecystectomy   . Hernia repair 01/25/11    lap ventral hernia repair x9    History   Social History  . Marital Status: Single    Spouse Name: N/A    Number of Children: N/A  . Years of Education: N/A   Occupational History  . Not on file.   Social History Main Topics  . Smoking status: Current Everyday Smoker -- 1.0 packs/day for 30 years    Types: Cigarettes  . Smokeless tobacco: Never Used  . Alcohol Use: Yes  . Drug Use: No  . Sexually Active: Yes -- Male partner(s)   Other Topics Concern  . Not on file   Social History Narrative  . No narrative on file    History reviewed. No pertinent family history.  Current outpatient prescriptions:allopurinol (ZYLOPRIM) 300 MG tablet, , Disp: , Rfl: ;  omeprazole (PRILOSEC) 40 MG capsule, Take 40 mg by mouth daily.  , Disp: , Rfl: ;  potassium chloride (KLOR-CON) 10 MEQ CR tablet, Take 10 mEq by mouth daily.  , Disp:  , Rfl:   No Known Allergies      Review of Systems  Constitutional: Negative for fever, chills and diaphoresis.  HENT: Negative for sore throat, trouble swallowing and neck pain.   Eyes: Negative for photophobia and visual disturbance.  Respiratory: Negative for choking and shortness of breath.   Cardiovascular: Negative for chest pain and palpitations.  Gastrointestinal: Positive for abdominal pain. Negative for nausea, vomiting, diarrhea, constipation, abdominal distention, anal bleeding and rectal pain.  Genitourinary: Negative for dysuria, urgency, difficulty urinating and testicular pain.  Musculoskeletal: Positive for myalgias and back pain. Negative for arthralgias and gait problem.  Skin: Negative for color change and rash.  Neurological: Negative for dizziness, speech difficulty, weakness and numbness.  Hematological: Negative for adenopathy.  Psychiatric/Behavioral: Negative for hallucinations, confusion and agitation.       Objective:   Physical Exam  Constitutional: He is oriented to person, place, and time. He appears well-developed and well-nourished. No distress.  HENT:  Head: Normocephalic.  Mouth/Throat: Oropharynx is clear and moist. No oropharyngeal exudate.  Eyes: Conjunctivae and EOM are normal. Pupils are equal, round, and reactive to light. No scleral icterus.  Neck: Normal range of motion. No tracheal deviation present.  Cardiovascular: Normal rate, normal heart sounds and intact distal pulses.   Pulmonary/Chest: Effort normal. No respiratory distress.  Abdominal: Soft. He exhibits no distension. There is  no tenderness. Hernia confirmed negative in the right inguinal area and confirmed negative in the left inguinal area.       Incisions clean with normal healing ridges.  No hernias.  Large seroma LLQ 18x15x15cm.  Musculoskeletal: Normal range of motion. He exhibits no tenderness.  Neurological: He is alert and oriented to person, place, and time. No  cranial nerve deficit. He exhibits normal muscle tone. Coordination normal.  Skin: Skin is warm and dry. No rash noted. He is not diaphoretic.  Psychiatric: He has a normal mood and affect. His behavior is normal.       Assessment:     One month s/p Lap VWH repairs in former heavy smoking morbidly obese male    Plan:     Extend disability to 3rd December given large surgery and physically demanding job  Continue heat and nonsteroidals  Continue binder.  If seroma does not resolve by 3 months postop, consider placement of a seroma drain in January 2013. I would like to wait and give time for the seroma to decrease although it is a concern that it has not changed in size yet. If he continues to get smaller I would wait. He is at high risk of infection. However, I do not think it remaining is a good idea either.  Continue to try and lose weight.  I am glad he has continued to stop smoking.  Return to clinic 2months. He will call me of the seroma pocket is not improved.

## 2012-03-23 ENCOUNTER — Inpatient Hospital Stay (HOSPITAL_COMMUNITY)
Admission: EM | Admit: 2012-03-23 | Discharge: 2012-03-25 | DRG: 204 | Disposition: A | Discharge status: Home / Self Care | Payer: BC Managed Care – PPO | Source: Ambulatory Visit | Attending: Internal Medicine | Admitting: Internal Medicine

## 2012-03-23 ENCOUNTER — Encounter (HOSPITAL_COMMUNITY): Payer: Self-pay | Admitting: Emergency Medicine

## 2012-03-23 ENCOUNTER — Emergency Department (HOSPITAL_COMMUNITY): Payer: BC Managed Care – PPO

## 2012-03-23 DIAGNOSIS — K859 Acute pancreatitis without necrosis or infection, unspecified: Principal | ICD-10-CM

## 2012-03-23 DIAGNOSIS — E119 Type 2 diabetes mellitus without complications: Secondary | ICD-10-CM

## 2012-03-23 DIAGNOSIS — E1140 Type 2 diabetes mellitus with diabetic neuropathy, unspecified: Secondary | ICD-10-CM | POA: Diagnosis present

## 2012-03-23 DIAGNOSIS — E876 Hypokalemia: Secondary | ICD-10-CM | POA: Diagnosis present

## 2012-03-23 DIAGNOSIS — I10 Essential (primary) hypertension: Secondary | ICD-10-CM

## 2012-03-23 DIAGNOSIS — K432 Incisional hernia without obstruction or gangrene: Secondary | ICD-10-CM

## 2012-03-23 DIAGNOSIS — D72829 Elevated white blood cell count, unspecified: Secondary | ICD-10-CM | POA: Diagnosis present

## 2012-03-23 DIAGNOSIS — R7309 Other abnormal glucose: Secondary | ICD-10-CM

## 2012-03-23 DIAGNOSIS — E1142 Type 2 diabetes mellitus with diabetic polyneuropathy: Secondary | ICD-10-CM | POA: Diagnosis present

## 2012-03-23 DIAGNOSIS — M25569 Pain in unspecified knee: Secondary | ICD-10-CM

## 2012-03-23 DIAGNOSIS — IMO0002 Reserved for concepts with insufficient information to code with codable children: Secondary | ICD-10-CM

## 2012-03-23 DIAGNOSIS — E785 Hyperlipidemia, unspecified: Secondary | ICD-10-CM

## 2012-03-23 DIAGNOSIS — E1149 Type 2 diabetes mellitus with other diabetic neurological complication: Secondary | ICD-10-CM | POA: Diagnosis present

## 2012-03-23 DIAGNOSIS — E871 Hypo-osmolality and hyponatremia: Secondary | ICD-10-CM

## 2012-03-23 DIAGNOSIS — E860 Dehydration: Secondary | ICD-10-CM

## 2012-03-23 DIAGNOSIS — Z9119 Patient's noncompliance with other medical treatment and regimen: Secondary | ICD-10-CM

## 2012-03-23 DIAGNOSIS — M23302 Other meniscus derangements, unspecified lateral meniscus, unspecified knee: Secondary | ICD-10-CM

## 2012-03-23 DIAGNOSIS — K7689 Other specified diseases of liver: Secondary | ICD-10-CM | POA: Diagnosis present

## 2012-03-23 DIAGNOSIS — I4891 Unspecified atrial fibrillation: Secondary | ICD-10-CM | POA: Diagnosis present

## 2012-03-23 DIAGNOSIS — E669 Obesity, unspecified: Secondary | ICD-10-CM | POA: Diagnosis present

## 2012-03-23 DIAGNOSIS — R739 Hyperglycemia, unspecified: Secondary | ICD-10-CM | POA: Diagnosis present

## 2012-03-23 DIAGNOSIS — D696 Thrombocytopenia, unspecified: Secondary | ICD-10-CM | POA: Diagnosis present

## 2012-03-23 DIAGNOSIS — K6389 Other specified diseases of intestine: Secondary | ICD-10-CM

## 2012-03-23 DIAGNOSIS — Z6841 Body Mass Index (BMI) 40.0 and over, adult: Secondary | ICD-10-CM

## 2012-03-23 DIAGNOSIS — M109 Gout, unspecified: Secondary | ICD-10-CM

## 2012-03-23 DIAGNOSIS — Z91199 Patient's noncompliance with other medical treatment and regimen due to unspecified reason: Secondary | ICD-10-CM

## 2012-03-23 DIAGNOSIS — F1010 Alcohol abuse, uncomplicated: Secondary | ICD-10-CM

## 2012-03-23 DIAGNOSIS — D7589 Other specified diseases of blood and blood-forming organs: Secondary | ICD-10-CM | POA: Diagnosis present

## 2012-03-23 DIAGNOSIS — I480 Paroxysmal atrial fibrillation: Secondary | ICD-10-CM | POA: Diagnosis present

## 2012-03-23 DIAGNOSIS — Z885 Allergy status to narcotic agent status: Secondary | ICD-10-CM

## 2012-03-23 HISTORY — DX: Type 2 diabetes mellitus without complications: E11.9

## 2012-03-23 HISTORY — DX: Paroxysmal atrial fibrillation: I48.0

## 2012-03-23 HISTORY — DX: Hyperlipidemia, unspecified: E78.5

## 2012-03-23 HISTORY — DX: Type 2 diabetes mellitus with diabetic neuropathy, unspecified: E11.40

## 2012-03-23 LAB — COMPREHENSIVE METABOLIC PANEL
ALT: 51 U/L (ref 0–53)
Alkaline Phosphatase: 107 U/L (ref 39–117)
BUN: 17 mg/dL (ref 6–23)
Chloride: 77 mEq/L — ABNORMAL LOW (ref 96–112)
GFR calc Af Amer: >90 mL/min (ref >90)
Glucose, Bld: 630 mg/dL (ref 70–99)
Potassium: 4.2 mEq/L (ref 3.5–5.1)
Sodium: 122 mEq/L — ABNORMAL LOW (ref 135–145)
Total Bilirubin: 1.1 mg/dL (ref 0.3–1.2)
Total Protein: 8.6 g/dL — ABNORMAL HIGH (ref 6.0–8.3)

## 2012-03-23 LAB — CBC WITH DIFFERENTIAL/PLATELET
Hemoglobin: 16.5 g/dL (ref 13.0–17.0)
Lymphocytes Relative: 10 % — ABNORMAL LOW (ref 12–46)
Lymphs Abs: 1.3 10*3/uL (ref 0.7–4.0)
Monocytes Relative: 8 % (ref 3–12)
Neutro Abs: 10.4 10*3/uL — ABNORMAL HIGH (ref 1.7–7.7)
Neutrophils Relative %: 81 % — ABNORMAL HIGH (ref 43–77)
Platelets: 182 10*3/uL (ref 150–400)
RBC: 4.8 MIL/uL (ref 4.22–5.81)
WBC: 12.9 10*3/uL — ABNORMAL HIGH (ref 4.0–10.5)

## 2012-03-23 LAB — GLUCOSE, CAPILLARY
Glucose-Capillary: 487 mg/dL — ABNORMAL HIGH (ref 70–99)
Glucose-Capillary: 473 mg/dL — ABNORMAL HIGH (ref 70–99)
Glucose-Capillary: 422 mg/dL — ABNORMAL HIGH (ref 70–99)

## 2012-03-23 LAB — LIPID PANEL
HDL: 43 mg/dL (ref >39)
Total CHOL/HDL Ratio: 5.4 RATIO
VLDL: 70 mg/dL — ABNORMAL HIGH (ref 0–40)

## 2012-03-23 LAB — LIPASE, BLOOD
Lipase: 2228 U/L — ABNORMAL HIGH (ref 11–59)
Lipase: 2527 U/L — ABNORMAL HIGH (ref 11–59)

## 2012-03-23 LAB — URINALYSIS, ROUTINE W REFLEX MICROSCOPIC
Bilirubin Urine: NEGATIVE
Ketones, ur: 15 mg/dL — AB
Nitrite: NEGATIVE
Specific Gravity, Urine: 1.01 (ref 1.005–1.030)
Urobilinogen, UA: 0.2 mg/dL (ref 0.0–1.0)
pH: 6 (ref 5.0–8.0)

## 2012-03-23 LAB — GLUCOSE, RANDOM: Glucose, Bld: 369 mg/dL — ABNORMAL HIGH (ref 70–99)

## 2012-03-23 LAB — HEMOGLOBIN A1C: Hgb A1c MFr Bld: 14.2 % — ABNORMAL HIGH (ref <5.7)

## 2012-03-23 MED ORDER — LORAZEPAM 2 MG/ML IJ SOLN
1.0000 mg | Freq: Four times a day (QID) | INTRAMUSCULAR | Status: DC | PRN
  Filled 2012-03-23: qty 1

## 2012-03-23 MED ORDER — METOPROLOL TARTRATE 1 MG/ML IV SOLN
5.0000 mg | Freq: Four times a day (QID) | INTRAVENOUS | Status: DC
  Administered 2012-03-23 – 2012-03-24 (×3): 5 mg via INTRAVENOUS
  Filled 2012-03-23 (×3): qty 5

## 2012-03-23 MED ORDER — VITAMIN B-1 100 MG PO TABS
100.0000 mg | ORAL_TABLET | Freq: Every day | ORAL | Status: DC
  Administered 2012-03-24 – 2012-03-25 (×2): 100 mg via ORAL
  Filled 2012-03-23 (×2): qty 1

## 2012-03-23 MED ORDER — SODIUM CHLORIDE 0.9 % IV SOLN
INTRAVENOUS | Status: DC
  Administered 2012-03-23 – 2012-03-24 (×3): via INTRAVENOUS
  Administered 2012-03-24: 1000 mL via INTRAVENOUS
  Administered 2012-03-25: 04:00:00 via INTRAVENOUS

## 2012-03-23 MED ORDER — HYDROMORPHONE HCL PF 1 MG/ML IJ SOLN
1.0000 mg | Freq: Once | INTRAMUSCULAR | Status: AC
  Administered 2012-03-23: 1 mg via INTRAVENOUS
  Filled 2012-03-23: qty 1

## 2012-03-23 MED ORDER — LORAZEPAM 2 MG/ML IJ SOLN: 0.0000 mg | Freq: Two times a day (BID) | INTRAMUSCULAR | Status: DC

## 2012-03-23 MED ORDER — FOLIC ACID 1 MG PO TABS
1.0000 mg | ORAL_TABLET | Freq: Every day | ORAL | Status: DC
  Administered 2012-03-23 – 2012-03-25 (×3): 1 mg via ORAL
  Filled 2012-03-23 (×3): qty 1

## 2012-03-23 MED ORDER — SODIUM CHLORIDE 0.9 % IV SOLN
INTRAVENOUS | Status: AC
  Administered 2012-03-23: 14:00:00 via INTRAVENOUS

## 2012-03-23 MED ORDER — INSULIN ASPART 100 UNIT/ML ~~LOC~~ SOLN
10.0000 [IU] | Freq: Once | SUBCUTANEOUS | Status: AC
  Administered 2012-03-23: 10 [IU] via INTRAVENOUS
  Filled 2012-03-23: qty 1

## 2012-03-23 MED ORDER — ONDANSETRON HCL 4 MG/2ML IJ SOLN: 4.0000 mg | Freq: Four times a day (QID) | INTRAMUSCULAR | Status: DC | PRN

## 2012-03-23 MED ORDER — SODIUM CHLORIDE 0.9 % IV SOLN
Freq: Once | INTRAVENOUS | Status: AC
  Administered 2012-03-23: 1000 mL via INTRAVENOUS

## 2012-03-23 MED ORDER — HYDROMORPHONE HCL PF 1 MG/ML IJ SOLN
1.0000 mg | INTRAMUSCULAR | Status: DC | PRN
  Administered 2012-03-23 – 2012-03-24 (×3): 2 mg via INTRAVENOUS
  Administered 2012-03-24 – 2012-03-25 (×5): 1 mg via INTRAVENOUS
  Filled 2012-03-23 (×2): qty 1
  Filled 2012-03-23: qty 2
  Filled 2012-03-23 (×2): qty 1
  Filled 2012-03-23: qty 2
  Filled 2012-03-23: qty 1
  Filled 2012-03-23: qty 2

## 2012-03-23 MED ORDER — ACETAMINOPHEN 325 MG PO TABS
650.0000 mg | ORAL_TABLET | Freq: Four times a day (QID) | ORAL | Status: DC | PRN
  Administered 2012-03-24 (×2): 650 mg via ORAL
  Filled 2012-03-23 (×2): qty 2

## 2012-03-23 MED ORDER — ACETAMINOPHEN 650 MG RE SUPP: 650.0000 mg | Freq: Four times a day (QID) | RECTAL | Status: DC | PRN

## 2012-03-23 MED ORDER — INSULIN ASPART 100 UNIT/ML ~~LOC~~ SOLN
0.0000 [IU] | SUBCUTANEOUS | Status: DC
  Administered 2012-03-23 (×2): 15 [IU] via SUBCUTANEOUS
  Administered 2012-03-24 (×3): 11 [IU] via SUBCUTANEOUS

## 2012-03-23 MED ORDER — LORAZEPAM 0.5 MG PO TABS: 1.0000 mg | ORAL_TABLET | Freq: Four times a day (QID) | ORAL | Status: DC | PRN

## 2012-03-23 MED ORDER — ADULT MULTIVITAMIN W/MINERALS CH
1.0000 | ORAL_TABLET | Freq: Every day | ORAL | Status: DC
  Administered 2012-03-23 – 2012-03-25 (×3): 1 via ORAL
  Filled 2012-03-23 (×3): qty 1

## 2012-03-23 MED ORDER — ONDANSETRON HCL 4 MG/2ML IJ SOLN: 4.0000 mg | Freq: Three times a day (TID) | INTRAMUSCULAR | Status: DC | PRN

## 2012-03-23 MED ORDER — ONDANSETRON HCL 4 MG PO TABS: 4.0000 mg | ORAL_TABLET | Freq: Four times a day (QID) | ORAL | Status: DC | PRN

## 2012-03-23 MED ORDER — HYDROMORPHONE HCL PF 1 MG/ML IJ SOLN
1.0000 mg | INTRAMUSCULAR | Status: DC | PRN
  Administered 2012-03-23: 1 mg via INTRAVENOUS
  Filled 2012-03-23: qty 1

## 2012-03-23 MED ORDER — PANTOPRAZOLE SODIUM 40 MG IV SOLR
40.0000 mg | INTRAVENOUS | Status: DC
  Administered 2012-03-23 – 2012-03-24 (×2): 40 mg via INTRAVENOUS
  Filled 2012-03-23 (×3): qty 40

## 2012-03-23 MED ORDER — SODIUM CHLORIDE 0.9 % IV BOLUS (SEPSIS)
1000.0000 mL | Freq: Once | INTRAVENOUS | Status: AC
  Administered 2012-03-23: 1000 mL via INTRAVENOUS

## 2012-03-23 MED ORDER — THIAMINE HCL 100 MG/ML IJ SOLN
100.0000 mg | Freq: Every day | INTRAMUSCULAR | Status: DC
  Administered 2012-03-23: 100 mg via INTRAVENOUS
  Filled 2012-03-23: qty 2

## 2012-03-23 MED ORDER — IOHEXOL 300 MG/ML  SOLN: 100.0000 mL | Freq: Once | INTRAMUSCULAR | Status: DC | PRN

## 2012-03-23 MED ORDER — ENOXAPARIN SODIUM 40 MG/0.4ML ~~LOC~~ SOLN
40.0000 mg | SUBCUTANEOUS | Status: DC
  Administered 2012-03-23: 40 mg via SUBCUTANEOUS
  Filled 2012-03-23 (×2): qty 0.4

## 2012-03-23 MED ORDER — LORAZEPAM 2 MG/ML IJ SOLN
0.0000 mg | Freq: Four times a day (QID) | INTRAMUSCULAR | Status: AC
  Administered 2012-03-23 – 2012-03-24 (×4): 1 mg via INTRAVENOUS
  Filled 2012-03-23 (×3): qty 1

## 2012-03-23 MED ORDER — HYDROMORPHONE HCL PF 1 MG/ML IJ SOLN
2.0000 mg | INTRAMUSCULAR | Status: DC | PRN
  Administered 2012-03-23: 2 mg via INTRAVENOUS
  Filled 2012-03-23: qty 2

## 2012-03-23 MED ORDER — MORPHINE SULFATE 4 MG/ML IJ SOLN
8.0000 mg | Freq: Once | INTRAMUSCULAR | Status: AC
  Administered 2012-03-23: 8 mg via INTRAVENOUS
  Filled 2012-03-23: qty 2

## 2012-03-23 MED ORDER — ONDANSETRON HCL 4 MG/2ML IJ SOLN
4.0000 mg | Freq: Once | INTRAMUSCULAR | Status: AC
  Administered 2012-03-23: 4 mg via INTRAVENOUS
  Filled 2012-03-23: qty 2

## 2012-03-23 NOTE — ED Notes (Signed)
Pt c/o abd pain with vomiting since yesterday.

## 2012-03-23 NOTE — H&P (Signed)
[facility] History and Physical  [name]:[id number] DOB: 03/01/1966 DOA: 03/23/2012  Referring physician: Dr. Preston [name] PCP: Brian Melena, MD  Specialists: Gen. Surgeon: Dr. Michaell [name]  Chief Complaint: abdominal pain  HPI: [name] is a 46 y.o. male history of complicated diverticulitis and multiple ventral hernias, status post multiple surgeries in the past. Patient was in his usual state of health when he developed sudden onset of abdominal pain yesterday. He describes as an epigastric pain which radiates through to his back. He has had associated nausea but no significant vomiting. No diarrhea. He has no diaphoresis but has not checked his temperature and fevers. He has had no similar episode in the past. He was seen in the ER and was found to have an elevated lipase and a CT scan of the abdomen and pelvis consistent with acute pancreatitis. Patient is status post cholecystectomy in the past. He has not been started any new medication. He admits to drinking 3-4 glasses of whiskey per day, everyday for the past 30 years. Patient was also found to be hyperglycemic in the emergency room. He reports briefly been on insulin in the past after he had had abdominal surgery. He was on Levemir for approximately 3 months after which it was discontinued. His wife reports that for the past few months, he has been increasingly thirsty and has been having increasing urination. His blood sugars have reportedly been in the 200s. On arrival to the emergency room, he was found to have a blood sugar greater than 600.  Review of Systems: pertinent positives as per HPI, otherwise negative  Past Medical History  Diagnosis Date  . Hypertension   . Diverticulitis   . Diabetes mellitus without complication    Past Surgical History  Procedure Date  . Colon surgery 04/15/2009  . Appendectomy 04/15/2009  . Laparoscopic cholecystectomy   . Hernia repair 01/25/11    lap ventral hernia repair x9   Social  History:  reports that he has been smoking Cigarettes.  He has a 30 pack-year smoking history. He has never used smokeless tobacco. He reports that he drinks alcohol. He reports that he does not use illicit drugs. Independent with ADLs  Allergies  Allergen Reactions  . Hydrocodone Itching    Family history: Both parents have hypertension, otherwise healthy  Prior to Admission medications   Medication Sig Start Date End Date Taking? Authorizing Provider  allopurinol (ZYLOPRIM) 300 MG tablet Take 300 mg by mouth daily.  01/21/11  Yes Historical Provider, MD  atenolol (TENORMIN) 100 MG tablet Take 100 mg by mouth daily.   Yes Historical Provider, MD  Cholecalciferol (VITAMIN D3) 10000 UNITS capsule Take 20,000 Units by mouth daily.   Yes Historical Provider, MD  MAGNESIUM-ZINC PO Take 1 tablet by mouth daily.   Yes Historical Provider, MD  omeprazole (PRILOSEC) 20 MG capsule Take 20 mg by mouth every other day.   Yes Historical Provider, MD  oxyCODONE-acetaminophen (PERCOCET) 10-325 MG per tablet Take 1 tablet by mouth every 4 (four) hours as needed. For pain   Yes Historical Provider, MD   Physical Exam: Filed Vitals:   03/23/12 1141 03/23/12 1229 03/23/12 1236 03/23/12 1355  BP: 152/95     Pulse: 70     Temp: 97.9 F (36.6 C)  98 F (36.7 C)   TempSrc: Oral  Oral   Resp: 18   15  Height:  5\' 11"  (1.803 m)    Weight:  128.1 kg (282 lb 6.6 oz)  SpO2: 96%   95%     General: Appears uncomfortable due to pain, but no signs of acute distress.   Eyes: Pupils are equal round and reactive to light  ENT: Mucous membranes are dry  Neck: Supple  Cardiovascular: S1, S2, regular rate and rhythm, no pedal edema bilaterally  Respiratory: Clear to auscultation bilaterally  Abdomen: Soft, mostly tender in the epigastrium, bowel sounds are present  Skin: Multiple scars noted over abdomen consistent with prior surgeries  Musculoskeletal: Deferred  Psychiatric: Normal affect,  cooperative exam  Neurologic: Grossly intact, nonfocal  Labs on Admission:  Basic Metabolic Panel:  Lab 03/23/12 1610  NA 122*  K 4.2  CL 77*  CO2 28  GLUCOSE 630*  BUN 17  CREATININE 1.03  CALCIUM 10.4  MG --  PHOS --   Liver Function Tests:  Lab 03/23/12 0752  AST 30  ALT 51  ALKPHOS 107  BILITOT 1.1  PROT 8.6*  ALBUMIN 4.0    Lab 03/23/12 0752  LIPASE 2228*  AMYLASE --   No results found for this basename: AMMONIA:5 in the last 168 hours CBC:  Lab 03/23/12 0752  WBC 12.9*  NEUTROABS 10.4*  HGB 16.5  HCT 45.8  MCV 95.4  PLT 182   Cardiac Enzymes: No results found for this basename: CKTOTAL:5,CKMB:5,CKMBINDEX:5,TROPONINI:5 in the last 168 hours  BNP (last 3 results) No results found for this basename: PROBNP:3 in the last 8760 hours CBG:  Lab 03/23/12 1137 03/23/12 1021  GLUCAP 473* 487*    Radiological Exams on Admission: Ct Abdomen Pelvis W Contrast  03/23/2012  *RADIOLOGY REPORT*  Clinical Data: 24 hour history of gradually worsening severe epigastric abdominal pain with nausea.  Prior history of diverticulitis.  Current history of hypertension and diabetes. Surgical history includes appendectomy, hysterectomy, and colonic resection.  CT ABDOMEN AND PELVIS WITH CONTRAST  Technique:  Multidetector CT imaging of the abdomen and pelvis was performed following the standard protocol during bolus administration of intravenous contrast.  Contrast:  100 ml Omnipaque-300 IV.  Oral contrast also administered.  Comparison: CT abdomen and pelvis 09/05/2009, 07/07/2009, 05/16/2009, 05/10/2009, 04/15/2009.  Findings: Edema/inflammation surrounding the entire pancreas, with the greatest involvement surrounding the pancreatic head.  Pancreas enhances normally throughout.  Fluid extends into the anterior pararenal spaces bilaterally.  No evidence of ascites elsewhere.  Diffuse hepatic steatosis without focal hepatic parenchymal abnormality.  Normal-appearing spleen,  adrenal glands, and kidneys. Gallbladder surgically absent.  No biliary ductal dilation.  Mild aorto-iliofemoral atherosclerosis.  No significant lymphadenopathy.  Normal-appearing stomach and small bowel.  Surgical anastomotic suture material at the level of the proximal sigmoid colon and in the region of the mid transverse colon.  The midportion of the transverse colon and a few loops of small bowel are stuck against the anterior abdominal wall at the surgical scar.  Some of the small bowel loops have inspissated stool-like material.  There is no small bowel distention.  There is no colonic distention.  A solitary diverticulum is identified in the distal descending colon, proximal to the anastomosis.  No ascites.  Urinary bladder unremarkable.  Prostate gland seminal vesicles normal for age.  Bone window images demonstrate mild degenerative changes involving the lower thoracic and lumbar spine.  Visualized lung bases clear. Heart size normal.  IMPRESSION:  1.  Uncomplicated acute pancreatitis. 2.  Mild diffuse hepatic steatosis without focal hepatic parenchymal abnormality. 3.  Adhesions involving the anterior abdomen at the site of the prior surgical scar.  The mid transverse  colon and a few loops of small bowel are stuck to the anterior abdominal wall.  Some the small bowel loops containing inspissated stool-like material consistent with localized stasis.  No evidence of bowel obstruction. 4.  Age advanced mild aorto-iliofemoral atherosclerosis. 5.  Solitary distal descending colon diverticulum, proximal to the anastomosis.   Original Report Authenticated By: Hulan Saas, M.D.      Assessment/Plan Principal Problem:  *Pancreatitis, acute Active Problems:  GOUT  HYPERTENSION  Hyponatremia  Dehydration  Hyperglycemia  Alcohol abuse   1. Acute pancreatitis. Likely secondary to alcohol abuse. Patient will be kept n.p.o. except ice chips. We will continue with IV fluids and pain management. If his  pain improves, we will try him on clear liquids tomorrow. Lipase was also be repeated in the morning. We will check lipid panel. 2. Hyponatremia. Likely secondary to volume depletion, and hyperglycemia. Patient will be started on saline and we will recheck labs in the morning. 3. Alcohol abuse. Patient was extensively counseled on the importance of alcohol cessation. He will be placed on alcohol withdrawal protocol 4. Diabetes mellitus. We'll check hemoglobin A1c. Continue on IV fluids and subcutaneous insulin every 4 hours for now. Patient's blood sugars will be monitored and if they begin to rise again, he may need to go on an insulin infusion. Once the patient is reliably eating, he may be started on a long-acting insulin such as Levemir again. 5. Gout. This appears to be stable. Hold allopurinol for now, while he is n.p.o.  6. Hypertension. Hold atenolol while he is n.p.o. We will use IV Lopressor until oral meds can be given.   Code Status: Full code Family Communication: Discussed with patient and wife at the bedside Disposition Plan: Discharge home once medically improve, anticipate he'll be in the hospital for 2-3 days.  Time spent:  , [facility] Pager [phone]  If 7PM-7AM, please contact night-coverage [email or web address] Password [facility] 03/23/2012, 3:28 PM

## 2012-03-23 NOTE — ED Notes (Signed)
CRITICAL VALUE ALERT  Critical value received:  Glucose 630  Date of notification:  03/23/2012  Time of notification:  0832  Critical value read back:yes  Nurse who received alert:  Tarri Glenn RN  MD notified (1st page):  Dr Preston Fleeting  Time of first page:  [phone]  MD notified (2nd page):  Time of second page:  Responding MD:  Dr Preston Fleeting  Time MD responded:  [phone]

## 2012-03-23 NOTE — Plan of Care (Signed)
Problem: Consults Goal: Pancreatitis Patient Education See Patient Education Module for education specifics. Outcome: Completed/Met Date Met:  03/23/12 Information printed and given to the patient and patients significant other.  Reviewed handouts with them.  Problem: Phase I Progression Outcomes Goal: OOB as tolerated unless otherwise ordered Outcome: Not Progressing Patient currently on bedrest

## 2012-03-23 NOTE — ED Provider Notes (Signed)
History   This chart was scribed for Dione Booze, MD by Toya Smothers, ED Scribe. The patient was seen in room APA06/APA06. Patient's care was started at 0733.  CSN: [id number]  Arrival date & time 03/23/12  1478   First MD Initiated Contact with Patient 03/23/12 [phone]      Chief Complaint  Patient presents with  . Abdominal Pain  . Emesis    HPI  [name] is a 46 y.o. male with h/o HTN, diverticulitis, and diabetes mellitus, who presents to the Emergency Department complaining of 24 hours of gradually worsening, constant, moderate abdominal pain with associated nausea after eating. Pain is sharpbegan in epigastric area, radiates to the lower back, and is rated 10/10. Pt also reports 2 episodes of emesis this morning. No Aggravators. Alleviated by heat. No relief with percocet. No fever, chills, cough, congestion, rhinorrhea, chest pain, SOB, or diarrhea. Pt admits use of tobacco products and consumption of alcohol, and denies use of illicit drugs. Surgical Hx includes colon surgery, appendectomy, cholecystectomy, and hernia repair.    Past Medical History  Diagnosis Date  . Hypertension   . Diverticulitis   . Diabetes mellitus without complication     Past Surgical History  Procedure Date  . Colon surgery 04/15/2009  . Appendectomy 04/15/2009  . Laparoscopic cholecystectomy   . Hernia repair 01/25/11    lap ventral hernia repair x9    History reviewed. No pertinent family history.  History  Substance Use Topics  . Smoking status: Current Every Day Smoker -- 1.0 packs/day for 30 years    Types: Cigarettes  . Smokeless tobacco: Never Used  . Alcohol Use: Yes      Review of Systems  Constitutional: Negative.  Negative for fever, chills and diaphoresis.  HENT: Negative.   Respiratory: Negative.   Cardiovascular: Negative.   Gastrointestinal: Positive for nausea, vomiting, abdominal pain and constipation. Negative for diarrhea.  Musculoskeletal: Negative.   Skin:  Negative.   Neurological: Negative.   Hematological: Negative.   Psychiatric/Behavioral: Negative.   All other systems reviewed and are negative.    Allergies  Hydrocodone  Home Medications   Current Outpatient Rx  Name  Route  Sig  Dispense  Refill  . ALLOPURINOL 300 MG PO TABS   Oral   Take 300 mg by mouth daily.          . ATENOLOL 100 MG PO TABS   Oral   Take 100 mg by mouth daily.         Marland Kitchen VITAMIN D3 10000 UNITS PO CAPS   Oral   Take 20,000 Units by mouth daily.         Marland Kitchen MAGNESIUM-ZINC PO   Oral   Take 1 tablet by mouth daily.         Marland Kitchen OMEPRAZOLE 20 MG PO CPDR   Oral   Take 20 mg by mouth every other day.         . OXYCODONE-ACETAMINOPHEN 10-325 MG PO TABS   Oral   Take 1 tablet by mouth every 4 (four) hours as needed. For pain           BP 111/49  Pulse 66  Resp 22  Ht 5\' 11"  (1.803 m)  Wt 290 lb (131.543 kg)  BMI 40.45 kg/m2  SpO2 96%  Physical Exam  Nursing note and vitals reviewed. Constitutional: He appears well-developed and well-nourished.       Obese and in pain.  HENT:  Head: Normocephalic and  atraumatic.  Eyes: Conjunctivae normal are normal. Pupils are equal, round, and reactive to light.  Neck: Neck supple. No tracheal deviation present. No thyromegaly present.  Cardiovascular: Normal rate and regular rhythm.   No murmur heard. Pulmonary/Chest: Effort normal and breath sounds normal.  Abdominal: Soft. Bowel sounds are normal. There is no rebound and no guarding.       Multiple surgical scars present. Diffusely tender. Maximum tenderness in epigastric. Bowel sounds are decreased.  Musculoskeletal: Normal range of motion. He exhibits no edema and no tenderness.  Neurological: He is alert. Coordination normal.  Skin: Skin is warm and dry. No rash noted.  Psychiatric: He has a normal mood and affect.    ED Course  Procedures DIAGNOSTIC STUDIES: Oxygen Saturation is 96% on room air, normal by my interpretation.     COORDINATION OF CARE: 07:50- Evaluated Pt. Pt is awake, alert, and without distress. Pt appears in pain.  Results for orders placed during the hospital encounter of 03/23/12  CBC WITH DIFFERENTIAL      Component Value Range   WBC 12.9 (*) 4.0 - 10.5 K/uL   RBC 4.80  4.22 - 5.81 MIL/uL   Hemoglobin 16.5  13.0 - 17.0 g/dL   HCT 45.4  09.8 - 11.9 %   MCV 95.4  78.0 - 100.0 fL   MCH 34.4 (*) 26.0 - 34.0 pg   MCHC 36.0  30.0 - 36.0 g/dL   RDW 14.7  82.9 - 56.2 %   Platelets 182  150 - 400 K/uL   Neutrophils Relative 81 (*) 43 - 77 %   Neutro Abs 10.4 (*) 1.7 - 7.7 K/uL   Lymphocytes Relative 10 (*) 12 - 46 %   Lymphs Abs 1.3  0.7 - 4.0 K/uL   Monocytes Relative 8  3 - 12 %   Monocytes Absolute 1.1 (*) 0.1 - 1.0 K/uL   Eosinophils Relative 0  0 - 5 %   Eosinophils Absolute 0.0  0.0 - 0.7 K/uL   Basophils Relative 0  0 - 1 %   Basophils Absolute 0.0  0.0 - 0.1 K/uL  COMPREHENSIVE METABOLIC PANEL      Component Value Range   Sodium 122 (*) 135 - 145 mEq/L   Potassium 4.2  3.5 - 5.1 mEq/L   Chloride 77 (*) 96 - 112 mEq/L   CO2 28  19 - 32 mEq/L   Glucose, Bld 630 (*) 70 - 99 mg/dL   BUN 17  6 - 23 mg/dL   Creatinine, Ser 1.30  0.50 - 1.35 mg/dL   Calcium 86.5  8.4 - 78.4 mg/dL   Total Protein 8.6 (*) 6.0 - 8.3 g/dL   Albumin 4.0  3.5 - 5.2 g/dL   AST 30  0 - 37 U/L   ALT 51  0 - 53 U/L   Alkaline Phosphatase 107  39 - 117 U/L   Total Bilirubin 1.1  0.3 - 1.2 mg/dL   GFR calc non Af Amer 84 (*) >90 mL/min   GFR calc Af Amer >90  >90 mL/min  LIPASE, BLOOD      Component Value Range   Lipase 2228 (*) 11 - 59 U/L  URINALYSIS, ROUTINE W REFLEX MICROSCOPIC      Component Value Range   Color, Urine YELLOW  YELLOW   APPearance CLEAR  CLEAR   Specific Gravity, Urine 1.010  1.005 - 1.030   pH 6.0  5.0 - 8.0   Glucose, UA >1000 (*) NEGATIVE mg/dL  Hgb urine dipstick SMALL (*) NEGATIVE   Bilirubin Urine NEGATIVE  NEGATIVE   Ketones, ur 15 (*) NEGATIVE mg/dL   Protein, ur 30  (*) NEGATIVE mg/dL   Urobilinogen, UA 0.2  0.0 - 1.0 mg/dL   Nitrite NEGATIVE  NEGATIVE   Leukocytes, UA NEGATIVE  NEGATIVE  URINE MICROSCOPIC-ADD ON      Component Value Range   RBC / HPF 0-2  <3 RBC/hpf  GLUCOSE, CAPILLARY      Component Value Range   Glucose-Capillary 487 (*) 70 - 99 mg/dL   Ct Abdomen Pelvis W Contrast  03/23/2012  *RADIOLOGY REPORT*  Clinical Data: 24 hour history of gradually worsening severe epigastric abdominal pain with nausea.  Prior history of diverticulitis.  Current history of hypertension and diabetes. Surgical history includes appendectomy, hysterectomy, and colonic resection.  CT ABDOMEN AND PELVIS WITH CONTRAST  Technique:  Multidetector CT imaging of the abdomen and pelvis was performed following the standard protocol during bolus administration of intravenous contrast.  Contrast:  100 ml Omnipaque-300 IV.  Oral contrast also administered.  Comparison: CT abdomen and pelvis 09/05/2009, 07/07/2009, 05/16/2009, 05/10/2009, 04/15/2009.  Findings: Edema/inflammation surrounding the entire pancreas, with the greatest involvement surrounding the pancreatic head.  Pancreas enhances normally throughout.  Fluid extends into the anterior pararenal spaces bilaterally.  No evidence of ascites elsewhere.  Diffuse hepatic steatosis without focal hepatic parenchymal abnormality.  Normal-appearing spleen, adrenal glands, and kidneys. Gallbladder surgically absent.  No biliary ductal dilation.  Mild aorto-iliofemoral atherosclerosis.  No significant lymphadenopathy.  Normal-appearing stomach and small bowel.  Surgical anastomotic suture material at the level of the proximal sigmoid colon and in the region of the mid transverse colon.  The midportion of the transverse colon and a few loops of small bowel are stuck against the anterior abdominal wall at the surgical scar.  Some of the small bowel loops have inspissated stool-like material.  There is no small bowel distention.  There is no  colonic distention.  A solitary diverticulum is identified in the distal descending colon, proximal to the anastomosis.  No ascites.  Urinary bladder unremarkable.  Prostate gland seminal vesicles normal for age.  Bone window images demonstrate mild degenerative changes involving the lower thoracic and lumbar spine.  Visualized lung bases clear. Heart size normal.  IMPRESSION:  1.  Uncomplicated acute pancreatitis. 2.  Mild diffuse hepatic steatosis without focal hepatic parenchymal abnormality. 3.  Adhesions involving the anterior abdomen at the site of the prior surgical scar.  The mid transverse colon and a few loops of small bowel are stuck to the anterior abdominal wall.  Some the small bowel loops containing inspissated stool-like material consistent with localized stasis.  No evidence of bowel obstruction. 4.  Age advanced mild aorto-iliofemoral atherosclerosis. 5.  Solitary distal descending colon diverticulum, proximal to the anastomosis.   Original Report Authenticated By: Hulan Saas, M.D.      Images viewed by me.   1. Pancreatitis   2. Hyperglycemia     CRITICAL CARE Performed by: ZOXWR,UEAVW   Total critical care time: 40 minutes  Critical care time was exclusive of separately billable procedures and treating other patients.  Critical care was necessary to treat or prevent imminent or life-threatening deterioration.  Critical care was time spent personally by me on the following activities: development of treatment plan with patient and/or surrogate as well as nursing, discussions with consultants, evaluation of patient's response to treatment, examination of patient, obtaining history from patient or surrogate, ordering and performing treatments  and interventions, ordering and review of laboratory studies, ordering and review of radiographic studies, pulse oximetry and re-evaluation of patient's condition.   MDM  Abdominal pain with vomiting though which is concerning for  possible small bowel obstruction given the patient's history of multiple surgeries. He is given IV fluids, IV narcotics, IV antiemetics and will be sent for CT scan.  Nausea is improved with ondansetron, but he required multiple doses of hydromorphone for pain relief. Blood sugar is come back markedly elevated at 630, and lipase is come back markedly elevated at 2228. He is given a dose of intravenous insulin. At this point, it may be that he has hyperlipidemia rate related to his diabetes causing his pancreatitis, but it may be that his pancreatitis has reduced insulin output causing his spike in blood sugar. Electrolytes did not show evidence of ketoacidosis and he has a normal anion gap.  CT scan shows evidence of pancreatitis without any other acute process. Blood sugar has come down under 500 with IV fluids and a dose of insulin is given a second dose of insulin. Case is discussed with Dr. Kerry Hough [facility] who agrees to admit the patient.   I personally performed the services described in this documentation, which was scribed in my presence. The recorded information has been reviewed and is accurate.     Dione Booze, MD 03/23/12 1046

## 2012-03-23 NOTE — ED Notes (Signed)
Patient back from CT. Patient c/o pain increasing again. EDP made aware.

## 2012-03-24 ENCOUNTER — Encounter (HOSPITAL_COMMUNITY): Payer: Self-pay | Admitting: Internal Medicine

## 2012-03-24 DIAGNOSIS — E119 Type 2 diabetes mellitus without complications: Secondary | ICD-10-CM | POA: Diagnosis present

## 2012-03-24 DIAGNOSIS — E785 Hyperlipidemia, unspecified: Secondary | ICD-10-CM | POA: Diagnosis present

## 2012-03-24 DIAGNOSIS — E871 Hypo-osmolality and hyponatremia: Secondary | ICD-10-CM

## 2012-03-24 HISTORY — DX: Type 2 diabetes mellitus without complications: E11.9

## 2012-03-24 HISTORY — DX: Hyperlipidemia, unspecified: E78.5

## 2012-03-24 LAB — CBC
Platelets: 158 10*3/uL (ref 150–400)
RBC: 4.48 MIL/uL (ref 4.22–5.81)
RDW: 12.3 % (ref 11.5–15.5)
WBC: 14.8 10*3/uL — ABNORMAL HIGH (ref 4.0–10.5)

## 2012-03-24 LAB — COMPREHENSIVE METABOLIC PANEL
Alkaline Phosphatase: 78 U/L (ref 39–117)
BUN: 13 mg/dL (ref 6–23)
Chloride: 90 mEq/L — ABNORMAL LOW (ref 96–112)
Creatinine, Ser: 0.77 mg/dL (ref 0.50–1.35)
GFR calc Af Amer: >90 mL/min (ref >90)
Glucose, Bld: 299 mg/dL — ABNORMAL HIGH (ref 70–99)
Potassium: 3.9 mEq/L (ref 3.5–5.1)
Total Bilirubin: 1.1 mg/dL (ref 0.3–1.2)
Total Protein: 7.4 g/dL (ref 6.0–8.3)

## 2012-03-24 LAB — GLUCOSE, CAPILLARY: Glucose-Capillary: 343 mg/dL — ABNORMAL HIGH (ref 70–99)

## 2012-03-24 LAB — T4, FREE: Free T4: 1.3 ng/dL (ref 0.80–1.80)

## 2012-03-24 MED ORDER — CLONIDINE HCL 0.2 MG/24HR TD PTWK
0.2000 mg | MEDICATED_PATCH | TRANSDERMAL | Status: DC
  Administered 2012-03-24: 0.2 mg via TRANSDERMAL
  Filled 2012-03-24: qty 1

## 2012-03-24 MED ORDER — INSULIN ASPART 100 UNIT/ML ~~LOC~~ SOLN
0.0000 [IU] | Freq: Three times a day (TID) | SUBCUTANEOUS | Status: DC
  Administered 2012-03-24 (×2): 15 [IU] via SUBCUTANEOUS
  Administered 2012-03-25: 7 [IU] via SUBCUTANEOUS
  Administered 2012-03-25: 11 [IU] via SUBCUTANEOUS

## 2012-03-24 MED ORDER — INSULIN DETEMIR 100 UNIT/ML ~~LOC~~ SOLN
35.0000 [IU] | Freq: Every day | SUBCUTANEOUS | Status: DC
  Administered 2012-03-24: 35 [IU] via SUBCUTANEOUS
  Filled 2012-03-24: qty 10

## 2012-03-24 MED ORDER — BIOTENE DRY MOUTH MT LIQD
15.0000 mL | Freq: Two times a day (BID) | OROMUCOSAL | Status: DC
  Administered 2012-03-24 – 2012-03-25 (×2): 15 mL via OROMUCOSAL

## 2012-03-24 MED ORDER — INSULIN ASPART 100 UNIT/ML ~~LOC~~ SOLN
0.0000 [IU] | Freq: Every day | SUBCUTANEOUS | Status: DC
  Administered 2012-03-24: 3 [IU] via SUBCUTANEOUS

## 2012-03-24 MED ORDER — METOPROLOL TARTRATE 1 MG/ML IV SOLN
5.0000 mg | INTRAVENOUS | Status: DC
  Administered 2012-03-24 – 2012-03-25 (×6): 5 mg via INTRAVENOUS
  Filled 2012-03-24 (×6): qty 5

## 2012-03-24 NOTE — Plan of Care (Signed)
Problem: Phase I Progression Outcomes Goal: OOB as tolerated unless otherwise ordered Outcome: Completed/Met Date Met:  03/24/12 Patient ambulating to BR and OOB to chair throughout day

## 2012-03-24 NOTE — Progress Notes (Signed)
Inpatient Diabetes Program Recommendations  AACE/ADA: New Consensus Statement on Inpatient Glycemic Control (2013)  Target Ranges:  Prepandial:   less than 140 mg/dL      Peak postprandial:   less than 180 mg/dL (1-2 hours)      Critically ill patients:  140 - 180 mg/dL   Results for [facility] (MRN [id number]) as of 03/24/2012 08:08  Ref. Range 03/23/2012 10:21 03/23/2012 11:37 03/23/2012 16:35 03/23/2012 19:57 03/23/2012 23:54 03/24/2012 04:07 03/24/2012 07:23  Glucose-Capillary Latest Range: 70-99 mg/dL 409 (H) 811 (H) 914 (H) 415 (H) 301 (H) 327 (H) 303 (H)    Inpatient Diabetes Program Recommendations Insulin - Basal: Please consider starting patient on basal insulin.  Recommend Lantus or Levemir 25 units daily.  Note: Patient has a history of diabetes but according to the chart patient is not on any home medications for diabetes.  Patient reports being on Levemir in the past.  A1C was 14.2 on 03/23/2012 and patient came in with a blood glucose of 630 mg/dl.  Please consider starting patient on basal insulin since blood glucose has been consistently elevated over 300 mg/dl.  Will continue to follow.  Thanks, Orlando Penner, RN, BSN, CCRN Diabetes Coordinator Inpatient Diabetes Program [phone]

## 2012-03-24 NOTE — Clinical Social Work Psychosocial (Signed)
    Clinical Social Work Department BRIEF PSYCHOSOCIAL ASSESSMENT 03/24/2012  Patient:  [name],[name]     Account Number:  [id number]     Admit date:  03/23/2012  Clinical Social Worker:  Santa Genera, CLINICAL SOCIAL WORKER  Date/Time:  03/24/2012 11:00 AM  Referred by:  Physician  Date Referred:  03/24/2012 Referred for  Substance Abuse   Other Referral:   Interview type:  Patient Other interview type:    PSYCHOSOCIAL DATA Living Status:  SIGNIFICANT OTHER Admitted from facility:   Level of care:   Primary support name:  Isabella Stalling Primary support relationship to patient:  SPOUSE Degree of support available:   Significant    CURRENT CONCERNS Current Concerns  Substance Abuse   Other Concerns:    SOCIAL WORK ASSESSMENT / PLAN CSW met w patient at bedside in ICU.  Patient alert and oriented x4.  Administered SBIRT, patient scored 9 indicating risky or hazardous drinking.  Results shared w patient.    Patient admits to daily use of 3 - 4 shots of whiskey every day after work.  Uses shot glass, so adamant that he is accurately estimating consumption.  Patient has drunk only shots of whiskey, denies any other alcohol or unprescribed substances.  Patient has been drinking for 20 years, has been stable at 3 - 4 shots/day for past 10 years.  Says daily consumption allows him to relax after work, drinks shots before dinner.  Lives w girlfriend who does not drink and has asked patient to cut down in past.    Patient states he has both diabetes and pancreatitis, seems more concerned about diabetes. 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  This has become increasingly painful over last 3 months, has leg aches and significant pain in both feet.  Wonders whether he can continue to keep his job due to his health decline.    Explored patient history of quitting  addictive substances. Patient quit smoking on advice of his surgeon who refused to perform hernia surgery until he quit tobacco use. Patient used gradual taper process to successfully taper off cigarettes.    Explored patient motivation and resources for quitting alcohol.  Patient understands that he may not be able to tolerate alcohol, and may need to quit entirely.  Per patient, he thinks he will be more successful if he gradually reduces the amount of whiskey he drinks.  Patient says he will check w MD about advisability of this plan. Patient listed several things he could do to relax after work including work on his cars, do house chores and mow grass.  Patient realizes he will need to find alternative method of destressing after work if he cannot drink alcohol.    Patient asked to think about plan for abstinence.  Patient declined all referrals for outpatient substance abuse counseling and AA meetings, stating that he preferred to quit drinking on his own.   Assessment/plan status:  Psychosocial Support/Ongoing Assessment of Needs Other assessment/ plan:   Information/referral to community resources:   Patient education booklet on alcohol    PATIENT'S/FAMILY'S RESPONSE TO PLAN OF CARE: Willing to consider alternatives to alcohol use.   Santa Genera, LCSW Clinical Social Worker [phone])

## 2012-03-24 NOTE — Progress Notes (Signed)
Subjective: The patient says that he is thirsty. On a scale of 1-10, his abdominal pain is 3/10. He denies nausea vomiting. He denies diarrhea. He says that he feels a little shaky. He denies any prior history of DT's.  Objective: Vital signs in last 24 hours: Filed Vitals:   03/24/12 0100 03/24/12 0300 03/24/12 0400 03/24/12 0500  BP:   173/100   Pulse: 93 103 103   Temp:   98 F (36.7 C)   TempSrc:   Oral   Resp:   19 17  Height:      Weight:    130.3 kg (287 lb 4.2 oz)  SpO2: 96% 92% 93%     Intake/Output Summary (Last 24 hours) at 03/24/12 0957 Last data filed at 03/24/12 1610  Gross per 24 hour  Intake   3095 ml  Output   2275 ml  Net    820 ml    Weight change:   Physical exam: General: Obese 46 year old Caucasian man sitting up in a chair, in no acute distress. Next line lungs: Decreased breath sounds in the bases otherwise clear. Heart: S1, S2, with borderline tachycardia. Abdomen: Obese, positive bowel sounds, minimal tenderness in the epigastrium, no rebound, no guarding, no masses palpated. Extremities: No pedal edema. Neurologic/psychiatric: He is alert and oriented x3. No evidence of tremulousness. His speech is clear. Flat affect. Cranial nerves II through XII are intact.  Lab Results: Basic Metabolic Panel:  Basename 03/24/12 0442 03/23/12 2037 03/23/12 0752  NA 134* -- 122*  K 3.9 -- 4.2  CL 90* -- 77*  CO2 27 -- 28  GLUCOSE 299* 369* --  BUN 13 -- 17  CREATININE 0.77 -- 1.03  CALCIUM 9.4 -- 10.4  MG -- -- --  PHOS -- -- --   Liver Function Tests:  [facility] 03/24/12 0442 03/23/12 0752  AST 21 30  ALT 35 51  ALKPHOS 78 107  BILITOT 1.1 1.1  PROT 7.4 8.6*  ALBUMIN 3.5 4.0    Basename 03/24/12 0800 03/23/12 0752  LIPASE 739* 9604*5409*  AMYLASE -- --   No results found for this basename: AMMONIA:2 in the last 72 hours CBC:  Basename 03/24/12 0442 03/23/12 0752  WBC 14.8* 12.9*  NEUTROABS -- 10.4*  HGB 15.6 16.5  HCT 44.2 45.8  MCV  98.7 95.4  PLT 158 182   Cardiac Enzymes: No results found for this basename: CKTOTAL:3,CKMB:3,CKMBINDEX:3,TROPONINI:3 in the last 72 hours BNP: No results found for this basename: PROBNP:3 in the last 72 hours D-Dimer: No results found for this basename: DDIMER:2 in the last 72 hours CBG:  Basename 03/24/12 0723 03/24/12 0407 03/23/12 2354 03/23/12 1957 03/23/12 1635 03/23/12 1137  GLUCAP 303* 327* 301* 415* 422* 473*   Hemoglobin A1C:  Basename 03/23/12 0752  HGBA1C 14.2*   Fasting Lipid Panel:  Basename 03/23/12 0752  CHOL 232*  HDL 43  LDLCALC 119*  TRIG 348*  CHOLHDL 5.4  LDLDIRECT --   Thyroid Function Tests:  Basename 03/23/12 0752  TSH 2.547  T4TOTAL --  FREET4 --  T3FREE --  THYROIDAB --   Anemia Panel: No results found for this basename: VITAMINB12,FOLATE,FERRITIN,TIBC,IRON,RETICCTPCT in the last 72 hours Coagulation: No results found for this basename: LABPROT:2,INR:2 in the last 72 hours Urine Drug Screen: Drugs of Abuse     Component Value Date/Time   LABOPIA NEGATIVE 01/12/2011 1430   COCAINSCRNUR NEGATIVE 01/12/2011 1430   LABBENZ NEGATIVE 01/12/2011 1430   AMPHETMU NEGATIVE 01/12/2011 1430    Alcohol  Level: No results found for this basename: ETH:2 in the last 72 hours Urinalysis:  Basename 03/23/12 0909  COLORURINE YELLOW  LABSPEC 1.010  PHURINE 6.0  GLUCOSEU >1000*  HGBUR SMALL*  BILIRUBINUR NEGATIVE  KETONESUR 15*  PROTEINUR 30*  UROBILINOGEN 0.2  NITRITE NEGATIVE  LEUKOCYTESUR NEGATIVE   Misc. Labs:   Micro: Recent Results (from the past 240 hour(s))  MRSA PCR SCREENING     Status: Normal   Collection Time   03/23/12 12:19 PM      Component Value Range Status Comment   MRSA by PCR NEGATIVE  NEGATIVE Final     Studies/Results: Ct Abdomen Pelvis W Contrast  03/23/2012  *RADIOLOGY REPORT*  Clinical Data: 24 hour history of gradually worsening severe epigastric abdominal pain with nausea.  Prior history of diverticulitis.   Current history of hypertension and diabetes. Surgical history includes appendectomy, hysterectomy, and colonic resection.  CT ABDOMEN AND PELVIS WITH CONTRAST  Technique:  Multidetector CT imaging of the abdomen and pelvis was performed following the standard protocol during bolus administration of intravenous contrast.  Contrast:  100 ml Omnipaque-300 IV.  Oral contrast also administered.  Comparison: CT abdomen and pelvis 09/05/2009, 07/07/2009, 05/16/2009, 05/10/2009, 04/15/2009.  Findings: Edema/inflammation surrounding the entire pancreas, with the greatest involvement surrounding the pancreatic head.  Pancreas enhances normally throughout.  Fluid extends into the anterior pararenal spaces bilaterally.  No evidence of ascites elsewhere.  Diffuse hepatic steatosis without focal hepatic parenchymal abnormality.  Normal-appearing spleen, adrenal glands, and kidneys. Gallbladder surgically absent.  No biliary ductal dilation.  Mild aorto-iliofemoral atherosclerosis.  No significant lymphadenopathy.  Normal-appearing stomach and small bowel.  Surgical anastomotic suture material at the level of the proximal sigmoid colon and in the region of the mid transverse colon.  The midportion of the transverse colon and a few loops of small bowel are stuck against the anterior abdominal wall at the surgical scar.  Some of the small bowel loops have inspissated stool-like material.  There is no small bowel distention.  There is no colonic distention.  A solitary diverticulum is identified in the distal descending colon, proximal to the anastomosis.  No ascites.  Urinary bladder unremarkable.  Prostate gland seminal vesicles normal for age.  Bone window images demonstrate mild degenerative changes involving the lower thoracic and lumbar spine.  Visualized lung bases clear. Heart size normal.  IMPRESSION:  1.  Uncomplicated acute pancreatitis. 2.  Mild diffuse hepatic steatosis without focal hepatic parenchymal abnormality. 3.   Adhesions involving the anterior abdomen at the site of the prior surgical scar.  The mid transverse colon and a few loops of small bowel are stuck to the anterior abdominal wall.  Some the small bowel loops containing inspissated stool-like material consistent with localized stasis.  No evidence of bowel obstruction. 4.  Age advanced mild aorto-iliofemoral atherosclerosis. 5.  Solitary distal descending colon diverticulum, proximal to the anastomosis.   Original Report Authenticated By: Hulan Saas, M.D.     Medications:  Scheduled:   . [COMPLETED] sodium chloride   Intravenous Once  . [COMPLETED] sodium chloride   Intravenous STAT  . cloNIDine  0.2 mg Transdermal Weekly  . enoxaparin (LOVENOX) injection  40 mg Subcutaneous Q24H  . folic acid  1 mg Oral Daily  . [COMPLETED] HYDROmorphone  1 mg Intravenous Once  . insulin aspart  0-15 Units Subcutaneous Q4H  . [COMPLETED] insulin aspart  10 Units Intravenous Once  . LORazepam  0-4 mg Intravenous Q6H   Followed by  .  LORazepam  0-4 mg Intravenous Q12H  . metoprolol  5 mg Intravenous Q4H  . [COMPLETED] morphine  8 mg Intravenous Once  . multivitamin with minerals  1 tablet Oral Daily  . pantoprazole (PROTONIX) IV  40 mg Intravenous Q24H  . thiamine  100 mg Oral Daily   Or  . thiamine  100 mg Intravenous Daily  . [DISCONTINUED] metoprolol  5 mg Intravenous Q6H   Continuous:   . sodium chloride 125 mL/hr at 03/24/12 0600   ZOX:[id number], acetaminophen, HYDROmorphone (DILAUDID) injection, LORazepam, LORazepam, ondansetron (ZOFRAN) IV, ondansetron, [DISCONTINUED]  HYDROmorphone (DILAUDID) injection, [DISCONTINUED]  HYDROmorphone (DILAUDID) injection, [DISCONTINUED] iohexol, [DISCONTINUED] ondansetron (ZOFRAN) IV  Assessment: Principal Problem:  *Pancreatitis, acute Active Problems:  DM type 2 (diabetes mellitus, type 2)  GOUT  HYPERTENSION  Hyponatremia  Dehydration  Hyperglycemia  Alcohol abuse  Hyperlipidemia   1.  Acute pancreatitis, secondary to alcohol abuse. He is symptomatically improved. His lipase has decreased, but has not normalized.  Alcohol abuse. The patient was advised to stop drinking indefinitely. I do not detect signs of alcohol withdrawal syndrome, however, the Ativan alcohol withdrawal protocol will be continued.  Malignant hypertension. His blood pressure is elevated off of Tenormin. He is on IV metoprolol. Catapres was added earlier to help with treating hypertension and secondarily for its central nervous system calming effects in the setting of alcohol abuse.   Type 2 diabetes mellitus. Apparently, the patient was diagnosed a year or so ago. He stopped taking Levemir cause of the cost and he thought that his diabetes is under control without it. Of note, his hemoglobin A1c is 14.2. Diabetes coordinator recommendations noted and appreciated.  Hyponatremia. Resolving with IV fluid hydration.  Hyperlipidemia. This will be treated accordingly, but will wait until he is able to tolerate by mouth medications and solids consistently.  Plan:  1. Start clear liquids as a trial. Do not advance yet.  2. Continue to monitor lipase levels daily. 3. Will continue metoprolol IV but change it to every 4 hours scheduled. Hopefully, atenolol can be restarted tomorrow orally. 4. Added Catapres patch earlier. 5. Social work consult for help with treatment of alcohol abuse. 6. Add Levemir to sliding scale NovoLog. Adjust sliding scale NovoLog accordingly. 7. Assess his thyroid function.   LOS: 1 day   , 03/24/2012, 9:57 AM

## 2012-03-25 ENCOUNTER — Encounter (HOSPITAL_COMMUNITY): Payer: Self-pay | Admitting: Internal Medicine

## 2012-03-25 DIAGNOSIS — E1140 Type 2 diabetes mellitus with diabetic neuropathy, unspecified: Secondary | ICD-10-CM

## 2012-03-25 DIAGNOSIS — I480 Paroxysmal atrial fibrillation: Secondary | ICD-10-CM | POA: Diagnosis present

## 2012-03-25 HISTORY — DX: Paroxysmal atrial fibrillation: I48.0

## 2012-03-25 HISTORY — DX: Type 2 diabetes mellitus with diabetic neuropathy, unspecified: E11.40

## 2012-03-25 LAB — GLUCOSE, CAPILLARY
Glucose-Capillary: 246 mg/dL — ABNORMAL HIGH (ref 70–99)
Glucose-Capillary: 270 mg/dL — ABNORMAL HIGH (ref 70–99)

## 2012-03-25 LAB — BASIC METABOLIC PANEL WITH GFR
BUN: 10 mg/dL (ref 6–23)
CO2: 29 meq/L (ref 19–32)
Calcium: 9 mg/dL (ref 8.4–10.5)
Chloride: 90 meq/L — ABNORMAL LOW (ref 96–112)
Creatinine, Ser: 0.78 mg/dL (ref 0.50–1.35)
GFR calc Af Amer: >90 mL/min
GFR calc non Af Amer: >90 mL/min
Glucose, Bld: 212 mg/dL — ABNORMAL HIGH (ref 70–99)
Potassium: 3.2 meq/L — ABNORMAL LOW (ref 3.5–5.1)
Sodium: 132 meq/L — ABNORMAL LOW (ref 135–145)

## 2012-03-25 LAB — CBC
HCT: 40.5 % (ref 39.0–52.0)
MCH: 34.5 pg — ABNORMAL HIGH (ref 26.0–34.0)
MCHC: 34.3 g/dL (ref 30.0–36.0)
RDW: 12.2 % (ref 11.5–15.5)

## 2012-03-25 MED ORDER — INSULIN NPH ISOPHANE & REGULAR (70-30) 100 UNIT/ML ~~LOC~~ SUSP: 15.0000 [IU] | Freq: Two times a day (BID) | SUBCUTANEOUS | Status: DC

## 2012-03-25 MED ORDER — GABAPENTIN 100 MG PO CAPS
100.0000 mg | ORAL_CAPSULE | Freq: Every evening | ORAL | Status: DC | PRN
Start: 2012-03-25 — End: 2017-08-26

## 2012-03-25 MED ORDER — POTASSIUM CHLORIDE CRYS ER 20 MEQ PO TBCR
30.0000 meq | EXTENDED_RELEASE_TABLET | Freq: Two times a day (BID) | ORAL | Status: DC
  Administered 2012-03-25: 30 meq via ORAL
  Filled 2012-03-25: qty 1

## 2012-03-25 MED ORDER — ATENOLOL 25 MG PO TABS
50.0000 mg | ORAL_TABLET | Freq: Every day | ORAL | Status: DC
  Administered 2012-03-25: 50 mg via ORAL
  Filled 2012-03-25: qty 2

## 2012-03-25 MED ORDER — INSULIN PEN STARTER KIT
1.0000 | Freq: Once | Status: DC
  Filled 2012-03-25: qty 1

## 2012-03-25 MED ORDER — ATENOLOL 25 MG PO TABS: 50.0000 mg | ORAL_TABLET | Freq: Every day | ORAL | Status: DC

## 2012-03-25 MED ORDER — ADULT MULTIVITAMIN W/MINERALS CH: 1.0000 | ORAL_TABLET | Freq: Every day | ORAL | Status: DC

## 2012-03-25 MED ORDER — PANTOPRAZOLE SODIUM 40 MG PO TBEC: 40.0000 mg | DELAYED_RELEASE_TABLET | Freq: Every day | ORAL | Status: DC

## 2012-03-25 MED ORDER — "BD GETTING STARTED TAKE HOME KIT: 1ML X 30 G SYRINGES, "
1.0000 | Freq: Once | Status: AC
  Administered 2012-03-25: 1
  Filled 2012-03-25: qty 1

## 2012-03-25 MED ORDER — LIVING WELL WITH DIABETES BOOK
Freq: Once | Status: AC
  Administered 2012-03-25: 16:00:00
  Filled 2012-03-25: qty 1

## 2012-03-25 MED ORDER — "BD GETTING STARTED TAKE HOME KIT: 1ML X 30 G SYRINGES, ": 1.0000 | Freq: Once | Status: DC

## 2012-03-25 MED ORDER — INSULIN ASPART PROT & ASPART (70-30 MIX) 100 UNIT/ML ~~LOC~~ SUSP
10.0000 [IU] | SUBCUTANEOUS | Status: AC
  Administered 2012-03-25: 10 [IU] via SUBCUTANEOUS
  Filled 2012-03-25: qty 3

## 2012-03-25 MED ORDER — POTASSIUM CHLORIDE CRYS ER 20 MEQ PO TBCR: 20.0000 meq | EXTENDED_RELEASE_TABLET | Freq: Every day | ORAL | Status: DC

## 2012-03-25 NOTE — Progress Notes (Signed)
Patient being discharged home. Patient given discharge instructions, follow up appointment and prescriptions before being discharged. Patient verbalized understanding of all discharge instructions. Patient being discharged with multiple reading materials on diabetes. Patient and wife demonstrated drawing up and administering SQ insulin. Patient alert, oriented and in stable condition at the time of discharge. Patient discharged home with wife.

## 2012-03-25 NOTE — Discharge Summary (Signed)
Physician Discharge Summary  [name] ZOX:[id number] DOB: Dec 05, 1965 DOA: 03/23/2012  PCP: Dwana Melena, MD  Admit date: 03/23/2012 Discharge date: 03/25/2012  Time spent: Greater than 30 minutes  Recommendations for Outpatient Follow-up:  1. The patient will followup with Dr. Margo Aye as scheduled. 2. The patient was encouraged to attend AA meetings to help with sobriety. 3. The patient was advised to stay out of work until he is reevaluated by Dr. Margo Aye. 4. Consider starting statin therapy in 1-2 weeks, following continued resolution of acute pancreatitis.  Discharge Diagnoses:  1. Acute pancreatitis, thought to be secondary to alcohol abuse. 2. Alcohol abuse. The patient was advised to stop drinking alcohol and to seek assistance. 3. Hyperlipidemia. The patient's fasting lipid profile revealed a total cholesterol of 232, triglycerides of 348, HDL cholesterol 43, and LDL cholesterol 119. Statin therapy was delayed until further healing from acute pancreatitis. 4. Uncontrolled type 2 diabetes mellitus, secondary to noncompliance. The patient's hemoglobin A1c was 14.2. 5. Probable neuropathic pain from diabetes mellitus. 6. Hyponatremia secondary to accelerated hyperglycemia and dehydration. 7. Hypertension.  8. Brief run of paroxysmal atrial fibrillation with rapid ventricular response. Resolved. 9. Gout. 10. Obesity. 11. Hypokalemia.  Discharge Condition: Improved.  Diet recommendation: Low-fat diet/carbohydrate modified.  Filed Weights   03/23/12 0743 03/23/12 1229 03/24/12 0500  Weight: 131.543 kg (290 lb) 128.1 kg (282 lb 6.6 oz) 130.3 kg (287 lb 4.2 oz)    History of present illness:  The patient is a 46 year old man with a history significant for alcohol abuse, diverticulitis, multiple ventral hernias with repairs, type 2 diabetes mellitus, and gout, who presented to the emergency department on 03/23/2012 with a chief complaint of abdominal pain. In the emergency department, he  was afebrile and hemodynamically stable. His lab data were significant for a lipase of 2228, glucose of 630, sodium of 122, and WBC of 12.9. CT of his abdomen and pelvis revealed uncomplicated acute pancreatitis, mild diffuse hepatic steatosis, adhesions involving the anterior abdomen at the site of prior surgical scar, no evidence of bowel obstruction, solitary distal descending colon diverticulum proximal to the anastomosis and age advanced mild or aorto-iliofemoral atherosclerosis. He was admitted for further evaluation and management.  Hospital Course:  The patient was made to be n.p.o. IV fluid hydration was initiated. Intravenous pain medication was initiated when necessary. IV antiemetics were started as well. The Ativan alcohol withdrawal protocol was initiated with when necessary Ativan and vitamin therapy. Levemir and sliding-scale NovoLog was initiated for treatment of uncontrolled diabetes mellitus. Atenolol was withheld, but his hypertension was treated with IV metoprolol and Catapres patch.   Acute pancreatitis. The patient's lipase was monitored daily. His fasting lipid profile revealed hypertriglyceridemia, but not at the level that would traditionally be associated with causing acute pancreatitis. Statin therapy was delayed as to avoid exacerbation of acute pancreatitis. After supportive treatment and IV fluid hydration, his lipase progressively improved to 98 at the time of discharge. His liver transaminases remained within normal limits. His diet was advanced which he tolerated well. He had at least one bowel movement during the hospitalization. He was virtually pain-free.   Alcohol abuse. The patient admitted to drinking at least 3-4 shots of whiskey every day after work. It is likely he drinks more. He was informed that his alcohol abuse caused his acute pancreatitis. He was advised to stop drinking completely. The clinical social worker was consulted to assist the patient with  outpatient resources that would help with sobriety. He was receptive, but admitted  that he like to drinking alcohol. He was given Ativan as needed and vitamin therapy. He demonstrated no signs consistent with alcohol withdrawal syndrome.   Type 2 diabetes mellitus, uncontrolled. The patient has a history of diabetes mellitus, but had been noncompliant due to the cost of Levemir. The diabetes coordinator was consulted and provided the patient with a refresher on diabetes education. He was informed that free diabetes education classes were available at [facility]. He was encouraged to attend. His hemoglobin A1c was 14.2. Insulin dosing was adjusted throughout the hospitalization. Because of cost, Levemir was discontinued at the time of discharge and he was started on Novolin 70/30 twice a day, which was more cost effective for the patient. He also complained of a burning sensation in the plantar surfaces of his feet, consistent with diabetic neuropathy. He was given a prescription for gabapentin. He was told that if he could not afford gabapentin, to discuss treatment with Dr. Margo Aye who could prescribe a medication that might be more cost effective. Of note, the patient was advised to take gabapentin only at bedtime as he is a [employer]. There was no evidence of DKA. His blood glucose was in the 200s prior to discharge.  Hyperlipidemia. The results of the patient's fasting lipid profile were dictated above. It is possible that his hypertriglyceridemia is a consequence of uncontrolled diabetes. Statin therapy was withheld as statins can cause or exacerbate pancreatitis. However, in the next week or 2, a consideration can be made to start statin therapy per their discretion of Dr. Margo Aye. The patient was advised to follow a low fat carbohydrate modified diet.  Hyponatremia and hypokalemia. The patient was started on IV fluid hydration with normal saline. His serum sodium improved to 134, but decreased  to 132 prior to discharge. Overall, his serum sodium improved significantly. With improvement in his glycemic control, his serum potassium decreased at 3.2. He was given 30 mEq of potassium chloride prior to discharge and discharged on 20 mEq daily for the next 2 weeks.  Paroxysmal atrial fibrillation with rapid ventricular response. While the patient was n.p.o., atenolol was discontinued. He was treated with small dosing of IV metoprolol every 4-6 hours. He developed a short run of atrial fibrillation with a heart rate in the 140s to 150s. His EKG revealed atrial fibrillation with rapid ventricular response. He was treated with IV metoprolol. Following treatment, his heart rate improved to the low 100s. Once he was able to begin eating again, atenolol was restarted. His followup EKG revealed normal sinus rhythm with lateral T wave changes.. He had no complaints of chest pain, but he would benefit from an elective cardiology evaluation sometime in the near future. His TSH was within normal limits at 0.94 and his free T4 was within normal limits at 1.3.  Leukocytosis/macrocytosis/thrombocytopenia. The patient's white blood cell count was elevated on admission, secondary to acute pancreatitis. It decreased to 11.4 at the time of discharge. His MCV was within normal limits, but increased to greater than 100. This was thought to be secondary to his alcohol abuse. His platelet count was within normal limits on admission, but decreased to 133. This is felt to be dilutional in part and secondary to alcohol abuse. Continued followup outpatient monitoring is recommended.   Procedures:  None  Consultations:  None  Discharge Exam: Filed Vitals:   03/25/12 1100 03/25/12 1200 03/25/12 1231 03/25/12 1600  BP: 132/87 126/72  135/84  Pulse:    90  Temp:  99.3 F (37.4 C)   TempSrc:   Axillary   Resp: 19 20    Height:      Weight:      SpO2:        General: Obese 46 year old man laying in bed, in no  acute distress. Cardiovascular: S1, S2, with no murmurs rubs or gallops. Respiratory: Clear to auscultation bilaterally. Abdomen: Obese, positive bowel sounds, soft, mildly tender in the epigastrium, no rebound, no distention, no masses palpated. Extremities: No pedal edema. Neurologic/psychiatric: He is alert and oriented x3. No signs of tremulousness. Pleasant affect.  Discharge Instructions  Discharge Orders    Future Orders Please Complete By Expires   Diet - low sodium heart healthy      Diet Carb Modified      Increase activity slowly      Discharge instructions      Comments:   Do not drink alcohol. Attend alcoholic's anonymous meetings to help with sobriety. Eat a low-fat diet with no grease or fried foods over the next few days. Buy ReliOn glucometer/diabetes machine and ReliOn testing strips from [employer] which are cheaper than the others. Check your blood sugars each morning and each evening and as needed thereafter. Do not go back to work until you are reevaluated by your primary care physician Dr. Margo Aye.       Medication List     As of 03/25/2012  4:31 PM    TAKE these medications         allopurinol 300 MG tablet   Commonly known as: ZYLOPRIM   Take 300 mg by mouth daily.      atenolol 100 MG tablet   Commonly known as: TENORMIN   Take 100 mg by mouth daily.      bd getting started take home kit Misc   1 kit by Other route once.      gabapentin 100 MG capsule   Commonly known as: NEURONTIN   Take 1-2 capsules (100-200 mg total) by mouth at bedtime as needed. For burning sensation in feet.      insulin NPH-insulin regular (70-30) 100 UNIT/ML injection   Commonly known as: NOVOLIN 70/30   Inject 15 Units into the skin 2 (two) times daily with a meal.      MAGNESIUM-ZINC PO   Take 1 tablet by mouth daily.      multivitamin with minerals Tabs   Take 1 tablet by mouth daily.      omeprazole 20 MG capsule   Commonly known as: PRILOSEC   Take 20 mg by  mouth every other day.      oxyCODONE-acetaminophen 10-325 MG per tablet   Commonly known as: PERCOCET   Take 1 tablet by mouth every 4 (four) hours as needed. For pain      potassium chloride SA 20 MEQ tablet   Commonly known as: K-DUR,KLOR-CON   Take 1 tablet (20 mEq total) by mouth daily.      Vitamin D3 10000 UNITS capsule   Take 20,000 Units by mouth daily.           Follow-up Information    Follow up with [facility], [place]. On 03/28/2012. (As scheduled.)    Contact information:   [address] [place] [address] [phone]           The results of significant diagnostics from this hospitalization (including imaging, microbiology, ancillary and laboratory) are listed below for reference.    Significant Diagnostic Studies: Ct Abdomen Pelvis W  Contrast  03/23/2012  *RADIOLOGY REPORT*  Clinical Data: 24 hour history of gradually worsening severe epigastric abdominal pain with nausea.  Prior history of diverticulitis.  Current history of hypertension and diabetes. Surgical history includes appendectomy, hysterectomy, and colonic resection.  CT ABDOMEN AND PELVIS WITH CONTRAST  Technique:  Multidetector CT imaging of the abdomen and pelvis was performed following the standard protocol during bolus administration of intravenous contrast.  Contrast:  100 ml Omnipaque-300 IV.  Oral contrast also administered.  Comparison: CT abdomen and pelvis 09/05/2009, 07/07/2009, 05/16/2009, 05/10/2009, 04/15/2009.  Findings: Edema/inflammation surrounding the entire pancreas, with the greatest involvement surrounding the pancreatic head.  Pancreas enhances normally throughout.  Fluid extends into the anterior pararenal spaces bilaterally.  No evidence of ascites elsewhere.  Diffuse hepatic steatosis without focal hepatic parenchymal abnormality.  Normal-appearing spleen, adrenal glands, and kidneys. Gallbladder surgically absent.  No biliary ductal dilation.  Mild aorto-iliofemoral  atherosclerosis.  No significant lymphadenopathy.  Normal-appearing stomach and small bowel.  Surgical anastomotic suture material at the level of the proximal sigmoid colon and in the region of the mid transverse colon.  The midportion of the transverse colon and a few loops of small bowel are stuck against the anterior abdominal wall at the surgical scar.  Some of the small bowel loops have inspissated stool-like material.  There is no small bowel distention.  There is no colonic distention.  A solitary diverticulum is identified in the distal descending colon, proximal to the anastomosis.  No ascites.  Urinary bladder unremarkable.  Prostate gland seminal vesicles normal for age.  Bone window images demonstrate mild degenerative changes involving the lower thoracic and lumbar spine.  Visualized lung bases clear. Heart size normal.  IMPRESSION:  1.  Uncomplicated acute pancreatitis. 2.  Mild diffuse hepatic steatosis without focal hepatic parenchymal abnormality. 3.  Adhesions involving the anterior abdomen at the site of the prior surgical scar.  The mid transverse colon and a few loops of small bowel are stuck to the anterior abdominal wall.  Some the small bowel loops containing inspissated stool-like material consistent with localized stasis.  No evidence of bowel obstruction. 4.  Age advanced mild aorto-iliofemoral atherosclerosis. 5.  Solitary distal descending colon diverticulum, proximal to the anastomosis.   Original Report Authenticated By: Hulan Saas, M.D.     Microbiology: Recent Results (from the past 240 hour(s))  MRSA PCR SCREENING     Status: Normal   Collection Time   03/23/12 12:19 PM      Component Value Range Status Comment   MRSA by PCR NEGATIVE  NEGATIVE Final      Labs: Basic Metabolic Panel:  Lab 03/25/12 4098 03/24/12 0442 03/23/12 2037 03/23/12 0752  NA 132* 134* -- 122*  K 3.2* 3.9 -- 4.2  CL 90* 90* -- 77*  CO2 29 27 -- 28  GLUCOSE 212* 299* 369* 630*  BUN 10 13  -- 17  CREATININE 0.78 0.77 -- 1.03  CALCIUM 9.0 9.4 -- 10.4  MG -- -- -- --  PHOS -- -- -- --   Liver Function Tests:  Lab 03/24/12 0442 03/23/12 0752  AST 21 30  ALT 35 51  ALKPHOS 78 107  BILITOT 1.1 1.1  PROT 7.4 8.6*  ALBUMIN 3.5 4.0    Lab 03/25/12 0502 03/24/12 0800 03/23/12 0752  LIPASE 98* 739* 1191*4782*  AMYLASE -- -- --   No results found for this basename: AMMONIA:5 in the last 168 hours CBC:  Lab 03/25/12 0502 03/24/12 0442 03/23/12 9562  WBC 11.4* 14.8* 12.9*  NEUTROABS -- -- 10.4*  HGB 13.9 15.6 16.5  HCT 40.5 44.2 45.8  MCV 100.5* 98.7 95.4  PLT 133* 158 182   Cardiac Enzymes: No results found for this basename: CKTOTAL:5,CKMB:5,CKMBINDEX:5,TROPONINI:5 in the last 168 hours BNP: BNP (last 3 results) No results found for this basename: PROBNP:3 in the last 8760 hours CBG:  Lab 03/25/12 1554 03/25/12 1119 03/25/12 0720 03/24/12 2115 03/24/12 1604  GLUCAP 270* 282* 246* 288* 325*       Signed:  ,  [facility] 03/25/2012, 4:31 PM

## 2012-03-25 NOTE — Progress Notes (Signed)
Inpatient Diabetes Program Recommendations  AACE/ADA: New Consensus Statement on Inpatient Glycemic Control (2013)  Target Ranges:  Prepandial:   less than 140 mg/dL      Peak postprandial:   less than 180 mg/dL (1-2 hours)      Critically ill patients:  140 - 180 mg/dL   Results for [facility] (MRN [id number]) as of 03/25/2012 08:22  Ref. Range 03/24/2012 07:23 03/24/2012 11:52 03/24/2012 16:04 03/24/2012 21:15 03/25/2012 07:20  Glucose-Capillary Latest Range: 70-99 mg/dL 409 (H) 811 (H) 914 (H) 288 (H) 246 (H)    Inpatient Diabetes Program Recommendations Insulin - Basal: Please consider increasing Levemir to 45 units QHS.  Fasting CBG this am on labs was 212 mg/dl.  Note: Patient was started on Levemir 35 units on 03/24/2012 and was given first dose at 21:33 on 03/24/2012.  Novolog correction scale was also increased to resistant scale on 03/24/2012.  Since fasting CBG remains elevated this morning, please consider increasing the Levemir to 45 units QHS.  Will continue to follow.  Thanks, Orlando Penner, RN, BSN, CCRN Diabetes Coordinator Inpatient Diabetes Program [phone]

## 2012-03-25 NOTE — Progress Notes (Signed)
The patient is receiving Protonix by the intravenous route.  Based on criteria approved by the Pharmacy and Therapeutics Committee and the Medical Executive Committee, the medication is being converted to the equivalent oral dose form.  These criteria include: -No Active GI bleeding -Able to tolerate diet of full liquids (or better) or tube feeding OR able to tolerate other medications by the oral or enteral route  If you have any questions about this conversion, please contact the Pharmacy Department (ext [phone]).  Thank you.  [name], [place] 03/25/2012 11:10 AM

## 2012-03-25 NOTE — Progress Notes (Signed)
PT HR 140'S-150'S   EKG =AFIB   PT GIVEN RX Lopressor 5mg  which improved HR to 120's.& futher improved by converting to ST low 100's  Dr Orvan Falconer updated

## 2012-03-25 NOTE — Care Management Note (Unsigned)
    Page 1 of 1   03/25/2012     1:00:11 PM   CARE MANAGEMENT NOTE 03/25/2012  Patient:  [facility]   Account Number:  [id number]  Date Initiated:  03/25/2012  Documentation initiated by:  Rosemary Holms  Subjective/Objective Assessment:   Pt admitted from home with spouse. Acute Pancreititis. ETOH abuse. To DC home, no HH identified     Action/Plan:   Anticipated DC Date:  03/26/2012   Anticipated DC Plan:  HOME/SELF CARE      DC Planning Services  CM consult      Choice offered to / List presented to:             Status of service:  In process, will continue to follow Medicare Important Message given?   (If response is "NO", the following Medicare IM given date fields will be blank) Date Medicare IM given:   Date Additional Medicare IM given:    Discharge Disposition:    Per UR Regulation:    If discussed at Long Length of Stay Meetings, dates discussed:    Comments:  03/25/12 Rosemary Holms RN BSN CM

## 2012-03-25 NOTE — Progress Notes (Signed)
Inpatient Diabetes Program Recommendations  AACE/ADA: New Consensus Statement on Inpatient Glycemic Control (2013)  Target Ranges:  Prepandial:   less than 140 mg/dL      Peak postprandial:   less than 180 mg/dL (1-2 hours)      Critically ill patients:  140 - 180 mg/dL    Note: Spoke with pt about type 2 diabetes. Discussed A1C results with him and explained what an A1C is, basic pathophysiology of DM Type 2, basic home care, importance of checking CBGs and maintaining good CBG control to prevent long-term and short-term complications. Reviewed signs and symptoms of hyperglycemia and hypoglycemia along with treatment for both. Have ordered Living Well with Diabetes educational booklet, insulin syringe starter kit, DM videos, and dietician consult. According to the patient he took Levemir via flexpen in the past but stopped taking it due to the cost.  Patient has insurance but has a high deductible plan and has to pay full price for prescription medications.  It is recommended that patient be placed on vials of insulin instead of flexpen because the pens are much more expensive than the vials.  Due to cost aspect of insulins, would recommend that patient be discharged on Novolin 70/30 mix in a vial which he can purchase at [facility] for $24.88 a vial.  Also talked with patient about current glucose meter and supplies.  According to the patient he has a FreeStyle meter but stopped checking blood sugar because of the cost of strips.  Advised patient to get ReliOn meter at [facility] [place] which cost  $19 and ReliOn testing strips which cost $9 for a box of 50 strips.  Have also talked with and provided patient with information on free outpatient diabetes education class held here at [facility] that are taught by Brown Human, RD.  RNs to provide ongoing basic DM education at bedside with the patient and ensure that patient watches the videos before discharge.  Will follow up with patient this afternoon to addresses  any issues or concerns that he may have.  Thanks, Orlando Penner, RN, BSN, CCRN Diabetes Coordinator Inpatient Diabetes Program [phone]

## 2012-03-25 NOTE — Progress Notes (Signed)
Utilization Review Complete  

## 2012-03-25 NOTE — Progress Notes (Signed)
  RD consulted for nutrition education regarding diabetes.   Lab Results  Component Value Date   HGBA1C 14.2* 03/23/2012    RD provided "Carbohydrate Counting for People with Diabetes" handout from the Academy of Nutrition and Dietetics. Discussed different food groups and their effects on blood sugar, emphasizing carbohydrate-containing foods. Provided list of carbohydrates and recommended serving sizes of common foods.  Discussed importance of controlled and consistent carbohydrate intake throughout the day. Provided examples of ways to balance meals/snacks and encouraged intake of high-fiber, whole grain complex carbohydrates.  Expect fair compliance.  Body mass index is 40.06 kg/(m^2). Pt meets criteria for Obesity Class III based on current BMI.  Current diet order is Fat Modified , patient is consuming approximately 50% of meals at this time. Labs and medications reviewed. No further nutrition interventions warranted at this time. RD contact information provided. If additional nutrition issues arise, please re-consult RD.  #[phone]

## 2012-03-26 NOTE — Progress Notes (Signed)
UR chart review completed.  

## 2012-09-16 ENCOUNTER — Telehealth (INDEPENDENT_AMBULATORY_CARE_PROVIDER_SITE_OTHER): Payer: Self-pay | Admitting: General Surgery

## 2012-09-16 NOTE — Telephone Encounter (Signed)
Pam- patient's girlfriend- called re: patient's recent symptoms. About one month ago patient was working in the yard and started having some abdominal pain. They have no noticed a bulge. Since that time he is having episodes of trapped gas where patient feels doubled over in pain. He says he feels like he can not pass gas and needs to have a bowel movement and can't. The pain will then go away and he can move bowels and pass gas. He is moving his bowels approximately 2-3 times daily. She states he does not have a GI MD because Dr Michaell Cowing has always ordered all his testing so she wanted to start with his opinion. She can be reached back at [phone] or [phone].

## 2012-09-17 ENCOUNTER — Encounter (HOSPITAL_COMMUNITY): Payer: Self-pay | Admitting: *Deleted

## 2012-09-17 ENCOUNTER — Emergency Department (HOSPITAL_COMMUNITY): Payer: BC Managed Care – PPO

## 2012-09-17 ENCOUNTER — Encounter (HOSPITAL_COMMUNITY)
Admission: EM | Disposition: A | Discharge status: Home / Self Care | Payer: Self-pay | Source: Home / Self Care | Attending: Surgery

## 2012-09-17 ENCOUNTER — Encounter (HOSPITAL_COMMUNITY): Payer: Self-pay | Admitting: Anesthesiology

## 2012-09-17 ENCOUNTER — Inpatient Hospital Stay (HOSPITAL_COMMUNITY)
Admission: EM | Admit: 2012-09-17 | Discharge: 2012-09-20 | DRG: 151 | Disposition: A | Discharge status: Home / Self Care | Payer: BC Managed Care – PPO | Attending: Surgery | Admitting: Surgery

## 2012-09-17 ENCOUNTER — Inpatient Hospital Stay (HOSPITAL_COMMUNITY): Payer: BC Managed Care – PPO | Admitting: Anesthesiology

## 2012-09-17 DIAGNOSIS — I10 Essential (primary) hypertension: Secondary | ICD-10-CM | POA: Diagnosis present

## 2012-09-17 DIAGNOSIS — M25519 Pain in unspecified shoulder: Secondary | ICD-10-CM | POA: Diagnosis present

## 2012-09-17 DIAGNOSIS — Z6841 Body Mass Index (BMI) 40.0 and over, adult: Secondary | ICD-10-CM

## 2012-09-17 DIAGNOSIS — K56609 Unspecified intestinal obstruction, unspecified as to partial versus complete obstruction: Secondary | ICD-10-CM

## 2012-09-17 DIAGNOSIS — K430 Incisional hernia with obstruction, without gangrene: Secondary | ICD-10-CM

## 2012-09-17 DIAGNOSIS — E785 Hyperlipidemia, unspecified: Secondary | ICD-10-CM | POA: Diagnosis present

## 2012-09-17 DIAGNOSIS — E119 Type 2 diabetes mellitus without complications: Secondary | ICD-10-CM | POA: Diagnosis present

## 2012-09-17 HISTORY — DX: Incisional hernia without obstruction or gangrene: K43.2

## 2012-09-17 HISTORY — PX: LAPAROSCOPIC LYSIS OF ADHESIONS: SHX5905

## 2012-09-17 HISTORY — DX: Acute pancreatitis without necrosis or infection, unspecified: K85.90

## 2012-09-17 HISTORY — PX: VENTRAL HERNIA REPAIR: SHX424

## 2012-09-17 LAB — URINALYSIS, ROUTINE W REFLEX MICROSCOPIC
Glucose, UA: >1000 mg/dL — AB
Specific Gravity, Urine: 1.038 — ABNORMAL HIGH (ref 1.005–1.030)
Urobilinogen, UA: 1 mg/dL (ref 0.0–1.0)
pH: 5.5 (ref 5.0–8.0)

## 2012-09-17 LAB — CBC WITH DIFFERENTIAL/PLATELET
Basophils Absolute: 0 10*3/uL (ref 0.0–0.1)
Basophils Relative: 0 % (ref 0–1)
Eosinophils Relative: 1 % (ref 0–5)
HCT: 43 % (ref 39.0–52.0)
Lymphocytes Relative: 29 % (ref 12–46)
MCHC: 34.9 g/dL (ref 30.0–36.0)
MCV: 96.2 fL (ref 78.0–100.0)
Monocytes Absolute: 1 10*3/uL (ref 0.1–1.0)
Platelets: 162 10*3/uL (ref 150–400)
RDW: 13.6 % (ref 11.5–15.5)
WBC: 9.5 10*3/uL (ref 4.0–10.5)

## 2012-09-17 LAB — URINE MICROSCOPIC-ADD ON

## 2012-09-17 LAB — COMPREHENSIVE METABOLIC PANEL
ALT: 59 U/L — ABNORMAL HIGH (ref 0–53)
AST: 42 U/L — ABNORMAL HIGH (ref 0–37)
Albumin: 3.4 g/dL — ABNORMAL LOW (ref 3.5–5.2)
Calcium: 9.6 mg/dL (ref 8.4–10.5)
Creatinine, Ser: 0.79 mg/dL (ref 0.50–1.35)
GFR calc non Af Amer: >90 mL/min (ref >90)
Sodium: 134 mEq/L — ABNORMAL LOW (ref 135–145)
Total Protein: 7.3 g/dL (ref 6.0–8.3)

## 2012-09-17 LAB — GLUCOSE, CAPILLARY: Glucose-Capillary: 220 mg/dL — ABNORMAL HIGH (ref 70–99)

## 2012-09-17 LAB — LIPASE, BLOOD: Lipase: 16 U/L (ref 11–59)

## 2012-09-17 LAB — MRSA PCR SCREENING: MRSA by PCR: NEGATIVE

## 2012-09-17 SURGERY — REPAIR, HERNIA, VENTRAL, LAPAROSCOPIC
Anesthesia: General | Site: Abdomen | Wound class: Clean Contaminated

## 2012-09-17 MED ORDER — LACTATED RINGERS IR SOLN
Status: DC | PRN
  Administered 2012-09-17: 1000 mL

## 2012-09-17 MED ORDER — ONDANSETRON HCL 4 MG/2ML IJ SOLN
INTRAMUSCULAR | Status: DC | PRN
  Administered 2012-09-17: 4 mg via INTRAVENOUS

## 2012-09-17 MED ORDER — INSULIN ASPART 100 UNIT/ML ~~LOC~~ SOLN: 7.0000 [IU] | Freq: Every evening | SUBCUTANEOUS | Status: DC

## 2012-09-17 MED ORDER — CHLORHEXIDINE GLUCONATE 4 % EX LIQD: 1.0000 "application " | Freq: Once | CUTANEOUS | Status: DC

## 2012-09-17 MED ORDER — SODIUM CHLORIDE 0.9 % IV BOLUS (SEPSIS)
1000.0000 mL | Freq: Once | INTRAVENOUS | Status: AC
  Administered 2012-09-17: 1000 mL via INTRAVENOUS

## 2012-09-17 MED ORDER — ALLOPURINOL 300 MG PO TABS
300.0000 mg | ORAL_TABLET | Freq: Every day | ORAL | Status: DC
  Administered 2012-09-17 – 2012-09-20 (×4): 300 mg via ORAL
  Filled 2012-09-17 (×4): qty 1

## 2012-09-17 MED ORDER — PANTOPRAZOLE SODIUM 40 MG PO TBEC
40.0000 mg | DELAYED_RELEASE_TABLET | Freq: Every day | ORAL | Status: DC
  Administered 2012-09-18 – 2012-09-20 (×3): 40 mg via ORAL
  Filled 2012-09-17 (×3): qty 1

## 2012-09-17 MED ORDER — MORPHINE SULFATE 2 MG/ML IJ SOLN
2.0000 mg | INTRAMUSCULAR | Status: DC | PRN
  Administered 2012-09-17 – 2012-09-18 (×4): 2 mg via INTRAVENOUS
  Filled 2012-09-17 (×4): qty 1

## 2012-09-17 MED ORDER — 0.9 % SODIUM CHLORIDE (POUR BTL) OPTIME
TOPICAL | Status: DC | PRN
  Administered 2012-09-17: 1000 mL

## 2012-09-17 MED ORDER — CEFAZOLIN SODIUM 10 G IJ SOLR
3.0000 g | INTRAMUSCULAR | Status: AC
  Administered 2012-09-17: 3 g via INTRAVENOUS

## 2012-09-17 MED ORDER — ONDANSETRON HCL 4 MG/2ML IJ SOLN
4.0000 mg | Freq: Once | INTRAMUSCULAR | Status: AC
  Administered 2012-09-17: 4 mg via INTRAVENOUS
  Filled 2012-09-17: qty 2

## 2012-09-17 MED ORDER — POTASSIUM CHLORIDE IN NACL 20-0.9 MEQ/L-% IV SOLN
INTRAVENOUS | Status: DC
  Administered 2012-09-18: 02:00:00 via INTRAVENOUS
  Filled 2012-09-17 (×5): qty 1000

## 2012-09-17 MED ORDER — HEPARIN SODIUM (PORCINE) 5000 UNIT/ML IJ SOLN
5000.0000 [IU] | Freq: Three times a day (TID) | INTRAMUSCULAR | Status: DC
  Administered 2012-09-18: 5000 [IU] via SUBCUTANEOUS
  Filled 2012-09-17 (×4): qty 1

## 2012-09-17 MED ORDER — CEFAZOLIN SODIUM-DEXTROSE 2-3 GM-% IV SOLR
2.0000 g | Freq: Three times a day (TID) | INTRAVENOUS | Status: AC
  Administered 2012-09-17 – 2012-09-18 (×2): 2 g via INTRAVENOUS
  Filled 2012-09-17 (×2): qty 50

## 2012-09-17 MED ORDER — ACETAMINOPHEN 325 MG PO TABS
325.0000 mg | ORAL_TABLET | Freq: Four times a day (QID) | ORAL | Status: DC | PRN
  Administered 2012-09-19: 650 mg via ORAL
  Filled 2012-09-17: qty 2

## 2012-09-17 MED ORDER — INSULIN ASPART 100 UNIT/ML ~~LOC~~ SOLN
5.0000 [IU] | Freq: Once | SUBCUTANEOUS | Status: AC
  Administered 2012-09-17: 5 [IU] via SUBCUTANEOUS

## 2012-09-17 MED ORDER — CHLORHEXIDINE GLUCONATE 4 % EX LIQD
1.0000 "application " | Freq: Once | CUTANEOUS | Status: DC
  Filled 2012-09-17: qty 15

## 2012-09-17 MED ORDER — PANTOPRAZOLE SODIUM 40 MG IV SOLR
40.0000 mg | Freq: Every day | INTRAVENOUS | Status: DC
  Administered 2012-09-17 – 2012-09-18 (×2): 40 mg via INTRAVENOUS
  Filled 2012-09-17 (×3): qty 40

## 2012-09-17 MED ORDER — LACTATED RINGERS IV SOLN
INTRAVENOUS | Status: DC | PRN
  Administered 2012-09-17 (×4): via INTRAVENOUS

## 2012-09-17 MED ORDER — ONDANSETRON HCL 4 MG/2ML IJ SOLN: 4.0000 mg | Freq: Four times a day (QID) | INTRAMUSCULAR | Status: DC | PRN

## 2012-09-17 MED ORDER — SODIUM CHLORIDE 0.9 % IV SOLN: 1.0000 "application " | INTRAVENOUS | Status: DC

## 2012-09-17 MED ORDER — DIPHENHYDRAMINE HCL 50 MG/ML IJ SOLN
25.0000 mg | Freq: Once | INTRAMUSCULAR | Status: AC
  Administered 2012-09-17: 25 mg via INTRAVENOUS
  Filled 2012-09-17: qty 1

## 2012-09-17 MED ORDER — HYDROMORPHONE HCL PF 1 MG/ML IJ SOLN
1.0000 mg | Freq: Once | INTRAMUSCULAR | Status: AC
  Administered 2012-09-17: 1 mg via INTRAVENOUS
  Filled 2012-09-17: qty 1

## 2012-09-17 MED ORDER — OXYCODONE HCL 5 MG PO TABS: 5.0000 mg | ORAL_TABLET | ORAL | Status: DC | PRN

## 2012-09-17 MED ORDER — ADULT MULTIVITAMIN W/MINERALS CH
1.0000 | ORAL_TABLET | Freq: Every day | ORAL | Status: DC
  Administered 2012-09-18 – 2012-09-20 (×3): 1 via ORAL
  Filled 2012-09-17 (×3): qty 1

## 2012-09-17 MED ORDER — HYDROMORPHONE HCL PF 1 MG/ML IJ SOLN
INTRAMUSCULAR | Status: AC
  Filled 2012-09-17: qty 2

## 2012-09-17 MED ORDER — CEFAZOLIN SODIUM-DEXTROSE 2-3 GM-% IV SOLR
INTRAVENOUS | Status: AC
  Filled 2012-09-17: qty 50

## 2012-09-17 MED ORDER — BUPIVACAINE-EPINEPHRINE 0.25% -1:200000 IJ SOLN
INTRAMUSCULAR | Status: AC
  Filled 2012-09-17: qty 1

## 2012-09-17 MED ORDER — ROCURONIUM BROMIDE 100 MG/10ML IV SOLN
INTRAVENOUS | Status: DC | PRN
  Administered 2012-09-17 (×7): 10 mg via INTRAVENOUS
  Administered 2012-09-17: 50 mg via INTRAVENOUS

## 2012-09-17 MED ORDER — BUPIVACAINE-EPINEPHRINE PF 0.25-1:200000 % IJ SOLN
INTRAMUSCULAR | Status: DC | PRN
  Administered 2012-09-17: 90 mL

## 2012-09-17 MED ORDER — MEPERIDINE HCL 50 MG/ML IJ SOLN: 6.2500 mg | INTRAMUSCULAR | Status: DC | PRN

## 2012-09-17 MED ORDER — DIPHENHYDRAMINE HCL 50 MG/ML IJ SOLN
12.5000 mg | Freq: Four times a day (QID) | INTRAMUSCULAR | Status: DC | PRN
  Administered 2012-09-17 – 2012-09-18 (×3): 12.5 mg via INTRAVENOUS
  Filled 2012-09-17 (×3): qty 1

## 2012-09-17 MED ORDER — NEOSTIGMINE METHYLSULFATE 1 MG/ML IJ SOLN
INTRAMUSCULAR | Status: DC | PRN
  Administered 2012-09-17: 5 mg via INTRAVENOUS

## 2012-09-17 MED ORDER — ZOLPIDEM TARTRATE 5 MG PO TABS: 5.0000 mg | ORAL_TABLET | Freq: Every evening | ORAL | Status: DC | PRN

## 2012-09-17 MED ORDER — IOHEXOL 300 MG/ML  SOLN
100.0000 mL | Freq: Once | INTRAMUSCULAR | Status: AC | PRN
  Administered 2012-09-17: 100 mL via INTRAVENOUS

## 2012-09-17 MED ORDER — MAGIC MOUTHWASH
15.0000 mL | Freq: Four times a day (QID) | ORAL | Status: DC | PRN
  Filled 2012-09-17: qty 15

## 2012-09-17 MED ORDER — ACETAMINOPHEN 10 MG/ML IV SOLN
INTRAVENOUS | Status: DC | PRN
  Administered 2012-09-17: 1000 mg via INTRAVENOUS

## 2012-09-17 MED ORDER — MIDAZOLAM HCL 5 MG/5ML IJ SOLN
INTRAMUSCULAR | Status: DC | PRN
  Administered 2012-09-17: 2 mg via INTRAVENOUS

## 2012-09-17 MED ORDER — FENTANYL CITRATE 0.05 MG/ML IJ SOLN
INTRAMUSCULAR | Status: DC | PRN
  Administered 2012-09-17: 50 ug via INTRAVENOUS
  Administered 2012-09-17 (×2): 100 ug via INTRAVENOUS
  Administered 2012-09-17 (×2): 50 ug via INTRAVENOUS

## 2012-09-17 MED ORDER — BISACODYL 10 MG RE SUPP: 10.0000 mg | Freq: Two times a day (BID) | RECTAL | Status: DC | PRN

## 2012-09-17 MED ORDER — BUPIVACAINE 0.25 % ON-Q PUMP DUAL CATH 300 ML
300.0000 mL | INJECTION | Status: DC
  Administered 2012-09-17: 270 mL
  Filled 2012-09-17: qty 300

## 2012-09-17 MED ORDER — INSULIN ASPART 100 UNIT/ML ~~LOC~~ SOLN
0.0000 [IU] | Freq: Three times a day (TID) | SUBCUTANEOUS | Status: DC
  Administered 2012-09-17: 4 [IU] via SUBCUTANEOUS

## 2012-09-17 MED ORDER — LIP MEDEX EX OINT
1.0000 "application " | TOPICAL_OINTMENT | Freq: Two times a day (BID) | CUTANEOUS | Status: DC
  Administered 2012-09-18 – 2012-09-20 (×5): 1 via TOPICAL
  Filled 2012-09-17 (×2): qty 7

## 2012-09-17 MED ORDER — METOPROLOL TARTRATE 12.5 MG HALF TABLET
12.5000 mg | ORAL_TABLET | Freq: Two times a day (BID) | ORAL | Status: DC | PRN
  Administered 2012-09-17: 12.5 mg via ORAL
  Filled 2012-09-17: qty 1

## 2012-09-17 MED ORDER — IOHEXOL 300 MG/ML  SOLN
50.0000 mL | Freq: Once | INTRAMUSCULAR | Status: AC | PRN
  Administered 2012-09-17: 50 mL via ORAL

## 2012-09-17 MED ORDER — INSULIN ASPART 100 UNIT/ML ~~LOC~~ SOLN
SUBCUTANEOUS | Status: AC
  Filled 2012-09-17: qty 1

## 2012-09-17 MED ORDER — STERILE WATER FOR IRRIGATION IR SOLN
Status: DC | PRN
  Administered 2012-09-17: 1500 mL

## 2012-09-17 MED ORDER — SODIUM CHLORIDE 0.9 % IJ SOLN: 3.0000 mL | INTRAMUSCULAR | Status: DC | PRN

## 2012-09-17 MED ORDER — GLYCOPYRROLATE 0.2 MG/ML IJ SOLN
INTRAMUSCULAR | Status: DC | PRN
  Administered 2012-09-17: .7 mg via INTRAVENOUS

## 2012-09-17 MED ORDER — SODIUM CHLORIDE 0.9 % IJ SOLN
3.0000 mL | Freq: Two times a day (BID) | INTRAMUSCULAR | Status: DC
  Administered 2012-09-19: 3 mL via INTRAVENOUS

## 2012-09-17 MED ORDER — ACETAMINOPHEN 10 MG/ML IV SOLN: 1000.0000 mg | Freq: Once | INTRAVENOUS | Status: DC | PRN

## 2012-09-17 MED ORDER — ACETAMINOPHEN 650 MG RE SUPP: 650.0000 mg | Freq: Four times a day (QID) | RECTAL | Status: DC | PRN

## 2012-09-17 MED ORDER — SODIUM CHLORIDE 0.9 % IV SOLN: 250.0000 mL | INTRAVENOUS | Status: DC | PRN

## 2012-09-17 MED ORDER — GABAPENTIN 100 MG PO CAPS
100.0000 mg | ORAL_CAPSULE | Freq: Every evening | ORAL | Status: DC | PRN
  Filled 2012-09-17: qty 1

## 2012-09-17 MED ORDER — SODIUM CHLORIDE 0.9 % IV SOLN
INTRAVENOUS | Status: AC
  Administered 2012-09-17: 19:00:00 via INTRAPERITONEAL
  Filled 2012-09-17: qty 6

## 2012-09-17 MED ORDER — LACTATED RINGERS IV SOLN
INTRAVENOUS | Status: DC
  Administered 2012-09-18: 09:00:00 via INTRAVENOUS
  Administered 2012-09-19: 50 mL/h via INTRAVENOUS

## 2012-09-17 MED ORDER — VANCOMYCIN HCL 10 G IV SOLR
1500.0000 mg | INTRAVENOUS | Status: AC
  Administered 2012-09-17: 1500 mg via INTRAVENOUS
  Filled 2012-09-17: qty 1500

## 2012-09-17 MED ORDER — HYDROMORPHONE HCL PF 1 MG/ML IJ SOLN
INTRAMUSCULAR | Status: DC | PRN
  Administered 2012-09-17 (×2): 1 mg via INTRAVENOUS

## 2012-09-17 MED ORDER — KCL IN DEXTROSE-NACL 20-5-0.9 MEQ/L-%-% IV SOLN
INTRAVENOUS | Status: DC
  Administered 2012-09-17: 09:00:00 via INTRAVENOUS
  Filled 2012-09-17: qty 1000

## 2012-09-17 MED ORDER — ACETAMINOPHEN 10 MG/ML IV SOLN
INTRAVENOUS | Status: AC
  Filled 2012-09-17: qty 100

## 2012-09-17 MED ORDER — CEFAZOLIN SODIUM 1-5 GM-% IV SOLN
INTRAVENOUS | Status: AC
  Filled 2012-09-17: qty 50

## 2012-09-17 MED ORDER — SUCCINYLCHOLINE CHLORIDE 20 MG/ML IJ SOLN
INTRAMUSCULAR | Status: DC | PRN
  Administered 2012-09-17: 180 mg via INTRAVENOUS

## 2012-09-17 MED ORDER — HYDROMORPHONE HCL PF 1 MG/ML IJ SOLN: 0.5000 mg | INTRAMUSCULAR | Status: DC | PRN

## 2012-09-17 MED ORDER — PROMETHAZINE HCL 25 MG/ML IJ SOLN: 12.5000 mg | Freq: Four times a day (QID) | INTRAMUSCULAR | Status: DC | PRN

## 2012-09-17 MED ORDER — INSULIN ASPART 100 UNIT/ML ~~LOC~~ SOLN
0.0000 [IU] | SUBCUTANEOUS | Status: DC
  Administered 2012-09-17: 3 [IU] via SUBCUTANEOUS
  Administered 2012-09-18: 2 [IU] via SUBCUTANEOUS

## 2012-09-17 MED ORDER — FENTANYL CITRATE 0.05 MG/ML IJ SOLN
INTRAMUSCULAR | Status: AC
  Administered 2012-09-17: 50 ug
  Filled 2012-09-17: qty 2

## 2012-09-17 MED ORDER — PROPOFOL 10 MG/ML IV BOLUS
INTRAVENOUS | Status: DC | PRN
  Administered 2012-09-17: 200 mg via INTRAVENOUS

## 2012-09-17 MED ORDER — DIPHENHYDRAMINE HCL 12.5 MG/5ML PO ELIX: 25.0000 mg | ORAL_SOLUTION | Freq: Once | ORAL | Status: DC

## 2012-09-17 MED ORDER — PROMETHAZINE HCL 25 MG/ML IJ SOLN: 6.2500 mg | INTRAMUSCULAR | Status: DC | PRN

## 2012-09-17 MED ORDER — ALUM & MAG HYDROXIDE-SIMETH 200-200-20 MG/5ML PO SUSP: 30.0000 mL | Freq: Four times a day (QID) | ORAL | Status: DC | PRN

## 2012-09-17 MED ORDER — METOPROLOL TARTRATE 1 MG/ML IV SOLN
5.0000 mg | Freq: Four times a day (QID) | INTRAVENOUS | Status: DC
  Administered 2012-09-17 – 2012-09-19 (×7): 5 mg via INTRAVENOUS
  Filled 2012-09-17 (×13): qty 5

## 2012-09-17 MED ORDER — HYDROMORPHONE HCL PF 1 MG/ML IJ SOLN
0.2500 mg | INTRAMUSCULAR | Status: DC | PRN
  Administered 2012-09-17 (×4): 0.5 mg via INTRAVENOUS

## 2012-09-17 MED ORDER — BUPIVACAINE 0.25 % ON-Q PUMP DUAL CATH 300 ML
300.0000 mL | INJECTION | Status: DC
  Filled 2012-09-17: qty 300

## 2012-09-17 MED ORDER — FENTANYL CITRATE 0.05 MG/ML IJ SOLN
50.0000 ug | INTRAMUSCULAR | Status: DC | PRN
  Administered 2012-09-17: 50 ug via INTRAVENOUS

## 2012-09-17 MED ORDER — INSULIN DETEMIR 100 UNIT/ML ~~LOC~~ SOLN
60.0000 [IU] | SUBCUTANEOUS | Status: DC
  Administered 2012-09-17 – 2012-09-18 (×2): 60 [IU] via SUBCUTANEOUS
  Filled 2012-09-17 (×2): qty 0.6

## 2012-09-17 MED ORDER — LIDOCAINE HCL (CARDIAC) 20 MG/ML IV SOLN
INTRAVENOUS | Status: DC | PRN
  Administered 2012-09-17: 100 mg via INTRAVENOUS

## 2012-09-17 MED ORDER — COLCHICINE 0.6 MG PO TABS
0.6000 mg | ORAL_TABLET | ORAL | Status: DC
  Administered 2012-09-18 – 2012-09-20 (×2): 0.6 mg via ORAL
  Filled 2012-09-17 (×2): qty 1

## 2012-09-17 MED ORDER — CEFAZOLIN SODIUM-DEXTROSE 2-3 GM-% IV SOLR: 2.0000 g | INTRAVENOUS | Status: DC

## 2012-09-17 MED ORDER — METHOCARBAMOL 500 MG PO TABS: 500.0000 mg | ORAL_TABLET | Freq: Four times a day (QID) | ORAL | Status: DC

## 2012-09-17 MED ORDER — MENTHOL 3 MG MT LOZG
1.0000 | LOZENGE | OROMUCOSAL | Status: DC | PRN
  Filled 2012-09-17: qty 9

## 2012-09-17 MED ORDER — PHENOL 1.4 % MT LIQD
2.0000 | OROMUCOSAL | Status: DC | PRN
  Filled 2012-09-17: qty 177

## 2012-09-17 SURGICAL SUPPLY — 57 items
APPLIER CLIP 5 13 M/L LIGAMAX5 (MISCELLANEOUS)
APR CLP MED LRG 5 ANG JAW (MISCELLANEOUS)
BINDER ABD UNIV 12 45-62 (WOUND CARE) IMPLANT
BINDER ABDOMINAL 46IN 62IN (WOUND CARE)
CANISTER SUCTION 2500CC (MISCELLANEOUS) ×2 IMPLANT
CATH KIT ON Q 7.5IN SLV (PAIN MANAGEMENT) IMPLANT
CATH KIT ON-Q SILVERSOAK 7.5 (CATHETERS) IMPLANT
CATH KIT ON-Q SILVERSOAK 7.5IN (CATHETERS) ×4 IMPLANT
CLIP APPLIE 5 13 M/L LIGAMAX5 (MISCELLANEOUS) IMPLANT
CLOTH BEACON ORANGE TIMEOUT ST (SAFETY) ×2 IMPLANT
DECANTER SPIKE VIAL GLASS SM (MISCELLANEOUS) ×2 IMPLANT
DEVICE SECURE STRAP 25 ABSORB (INSTRUMENTS) ×2 IMPLANT
DEVICE TROCAR PUNCTURE CLOSURE (ENDOMECHANICALS) IMPLANT
DRAIN CHANNEL 15F RND FF 3/16 (WOUND CARE) ×1 IMPLANT
DRAPE LAPAROSCOPIC ABDOMINAL (DRAPES) ×2 IMPLANT
DRSG TEGADERM 2-3/8X2-3/4 SM (GAUZE/BANDAGES/DRESSINGS) ×4 IMPLANT
DRSG TEGADERM 4X4.75 (GAUZE/BANDAGES/DRESSINGS) ×1 IMPLANT
ELECT REM PT RETURN 9FT ADLT (ELECTROSURGICAL) ×2
ELECTRODE REM PT RTRN 9FT ADLT (ELECTROSURGICAL) ×1 IMPLANT
EVACUATOR SILICONE 100CC (DRAIN) ×1 IMPLANT
FILTER SMOKE EVAC LAPAROSHD (FILTER) IMPLANT
GAUZE SPONGE 2X2 8PLY STRL LF (GAUZE/BANDAGES/DRESSINGS) IMPLANT
GLOVE ECLIPSE 8.0 STRL XLNG CF (GLOVE) ×2 IMPLANT
GLOVE INDICATOR 8.0 STRL GRN (GLOVE) ×4 IMPLANT
GOWN STRL NON-REIN LRG LVL3 (GOWN DISPOSABLE) ×2 IMPLANT
GOWN STRL REIN XL XLG (GOWN DISPOSABLE) ×4 IMPLANT
HAND ACTIVATED (MISCELLANEOUS) IMPLANT
KIT BASIN OR (CUSTOM PROCEDURE TRAY) ×2 IMPLANT
MARKER SKIN DUAL TIP RULER LAB (MISCELLANEOUS) ×2 IMPLANT
MESH VENTRALIGHT ST 8X10 (Mesh General) ×1 IMPLANT
NDL SPNL 22GX3.5 QUINCKE BK (NEEDLE) IMPLANT
NEEDLE SPNL 22GX3.5 QUINCKE BK (NEEDLE) IMPLANT
NS IRRIG 1000ML POUR BTL (IV SOLUTION) ×2 IMPLANT
PENCIL BUTTON HOLSTER BLD 10FT (ELECTRODE) ×1 IMPLANT
SCISSORS LAP 5X35 DISP (ENDOMECHANICALS) ×4 IMPLANT
SET IRRIG TUBING LAPAROSCOPIC (IRRIGATION / IRRIGATOR) IMPLANT
SLEEVE XCEL OPT CAN 5 100 (ENDOMECHANICALS) ×4 IMPLANT
SLEEVE Z-THREAD 5X100MM (TROCAR) ×4 IMPLANT
SPONGE GAUZE 2X2 STER 10/PKG (GAUZE/BANDAGES/DRESSINGS) ×1
SPONGE LAP 18X18 X RAY DECT (DISPOSABLE) ×2 IMPLANT
STRIP CLOSURE SKIN 1/2X4 (GAUZE/BANDAGES/DRESSINGS) ×1 IMPLANT
SUT ETHILON 2 0 PS N (SUTURE) ×1 IMPLANT
SUT MNCRL AB 4-0 PS2 18 (SUTURE) ×4 IMPLANT
SUT PROLENE 1 CT 1 30 (SUTURE) ×12 IMPLANT
SUT SILK 3 0 SH CR/8 (SUTURE) ×1 IMPLANT
SUT VIC AB 2-0 UR6 27 (SUTURE) IMPLANT
TOWEL OR 17X26 10 PK STRL BLUE (TOWEL DISPOSABLE) ×2 IMPLANT
TRAY FOLEY CATH 14FRSI W/METER (CATHETERS) IMPLANT
TRAY LAP CHOLE (CUSTOM PROCEDURE TRAY) ×2 IMPLANT
TROCAR BLADELESS OPT 5 100 (ENDOMECHANICALS) ×1 IMPLANT
TROCAR SLEEVE XCEL 5X75 (ENDOMECHANICALS) ×2 IMPLANT
TROCAR XCEL BLADELESS 5X75MML (TROCAR) ×2 IMPLANT
TROCAR Z-THREAD FIOS 11X100 BL (TROCAR) ×2 IMPLANT
TROCAR Z-THREAD FIOS 5X100MM (TROCAR) ×2 IMPLANT
TUBING INSUFFLATION 10FT LAP (TUBING) ×2 IMPLANT
TUNNELER SHEATH ON-Q 16GX12 DP (PAIN MANAGEMENT) ×3 IMPLANT
YANKAUER SUCT BULB TIP 10FT TU (MISCELLANEOUS) ×1 IMPLANT

## 2012-09-17 NOTE — Op Note (Signed)
09/17/2012  7:47 PM  PATIENT:  [name]  47 y.o. male  Patient Care Team: Catalina Pizza, MD as PCP - General (Internal Medicine)  PRE-OPERATIVE DIAGNOSIS:  recurrent ventral hernia SBO  POST-OPERATIVE DIAGNOSIS:  recurrent ventral hernia Incarcerated Richter's hernia  PROCEDURE:  Procedure(s):  LAPAROSCOPIC and Open  LYSIS OF ADHESIONS LAPAROSCOPIC VENTRAL HERNIA with mesh Placement of OnQ Pump  SURGEON:  Surgeon(s): Ardeth Sportsman, MD  ASSISTANT: RN   ANESTHESIA:   local and general  EBL:  Total I/O In: 300 [I.V.:300] Out: 100 [Blood:100]  Delay start of Pharmacological VTE agent (>24hrs) due to surgical blood loss or risk of bleeding:  no  DRAINS: (15) Blake drain(s) in the SQ abdomen   SPECIMEN:  No Specimen  DISPOSITION OF SPECIMEN:  N/A  COUNTS:  YES  PLAN OF CARE: Admit to inpatient   PATIENT DISPOSITION:  PACU - hemodynamically stable.  INDICATION: Pleasant patient.  Numerous prior surgeries.  Had a laparoscopic repair with mesh of recurrent incisional hernias.  Recovered well.  Now has developed a ventral wall abdominal hernia.   Intermittent crampy abdominal pain.  CT scan shows knuckle of intestine in a periumbilical hernia.  Recommendation was made for surgical repair:  The anatomy & physiology of the abdominal wall was discussed. The pathophysiology of hernias was discussed. Natural history risks without surgery including progeressive enlargement, pain, incarceration & strangulation was discussed. Contributors to complications such as smoking, obesity, diabetes, prior surgery, etc were discussed.  I feel the risks of no intervention will lead to serious problems that outweigh the operative risks; therefore, I recommended surgery to reduce and repair the hernia. I explained laparoscopic techniques with possible need for an open approach. I noted the probable use of mesh to patch and/or buttress the hernia repair  Risks such as bleeding, infection,  abscess, need for further treatment, heart attack, death, and other risks were discussed. I noted a good likelihood this will help address the problem. Goals of post-operative recovery were discussed as well. Possibility that this will not correct all symptoms was explained. I stressed the importance of low-impact activity, aggressive pain control, avoiding constipation, & not pushing through pain to minimize risk of post-operative chronic pain or injury. Possibility of reherniation especially with smoking, obesity, diabetes, immunosuppression, and other health conditions was discussed. We will work to minimize complications.  An educational handout further explaining the pathology & treatment options was given as well. Questions were answered. The patient expresses understanding & wishes to proceed with surgery.   OR FINDINGS: He had dense small bowel adhesions into the Parietex dual sided mesh.  He had three spontaneous tears of the mesh periumbilically.  Two were small and had omentum and the small bowel mesentery not it easily reduced.  The larger one was incarcerated with a knuckle of small intestine.  This was the transition point for the bowel obstruction.  There was no necrosis or ischemia or perforation or abscess.   Type of repair - Laparoscopic underlay repair under primary repair   Name of mesh - 25x20cm Bard Dualsided mesh (polypropylene/Seprafilm)  Size of mesh - Length 25 cm, Width 20 cm  Mesh overlap - 10 cm  Placement of mesh - Intraperitoneal underlay repair   DESCRIPTION:   Informed consent was confirmed. The patient underwent general anaesthesia without difficulty. The patient was positioned appropriately. VTE prevention in place. The patient's abdomen was clipped, prepped, & draped in a sterile fashion. Surgical timeout confirmed our plan.  The patient  was positioned in reverse Trendelenburg. Abdominal entry was gained using optical entry technique in the left upper abdomen.  Entry was clean. I induced carbon dioxide insufflation. Camera inspection revealed no injury. Extra ports were carefully placed under direct laparoscopic visualization.   I could see dense adhesions of small bowel and omentum to the mesh on the anterior abdominal wall.  Carefully freed the omentum off and some loops of small bowel.  Centrally near where the suspected hernia was, heat extremely dense adhesions of small intestine.  This was the transition point.  I did laparoscopic lysis of adhesions to expose the entire anterior abdominal wall.  I primarily used and focused cold scissors.    I had to place ports on both sides to get up and safely.  He had a few 1 cm central tears in the Parietex mesh x2 more.  Just some small bowel mesentery and omentum stuck in there and it easily reduced.  I found the edges of a hole in the middle of the larger Parietex mesh.  I freed off as much bowel as I could around the edges but an obvious loop of bowel was stuck in the subcutaneous tissues and would not reduce down.    Therefore, I did an incision periumbilical he through the skin after confirming a safe place to enter.  I freed off the fascia.  I found small intense intestine densely adherent to the mesh on the superficial side.  I did careful open lysis lesions and freed that off.  With that I could eviscerate the small bowel and inspect it.  There was some oozing on the more dilated side that I controlled with silk stitches on the mesentery and serosa.  I did lysis of adhesions to free the knuckle of small intestine that was a Richter's type hernia to straighten out and relieve the obstruction.  The bowel was viable and not necrosis.  There was no perforation.  There is no abscess.  Because there is no infection, I thought it was reasonable to repair the weak area and defect with another piece of mesh.  I made sure hemostasis was good.  I mapped out the region using a needle passer.   To ensure that I would have at  least 5 cm radial coverage outside of the hernia defect, I chose a 25x20 cm dual sided mesh.  I placed #1 Prolene stitches around its edge about every 5 cm = 12 total.  I rolled the mesh & placed into the peritoneal cavity through the 10 cm fascial defect.  I unrolled the mesh and positioned it appropriately.    I closed the mesh and fascia using interrupted #1 Prolene stitches.  He still had a decent size hernia sac an empty space after dissection.  I placed a drain in the left lower quadrant port in the subcutaneous tissues to help drain the area and avoid a new seroma.  The tip also went into the peritoneum.  I closed the skin using 4-0 Monocryl stitch.  I secured the mesh to cover up the hernia defect using a laparoscopic suture passer to pass the tails of the Prolene through the abdominal wall & tagged them with clamps.  I started out in four corners to make sure I had the mesh centered over the hernia defect appropriately, and then proceeded to work in quadrants.  We evacuated CO2 & desufflated the abdomen.  I tied the fascial stitches down.  I reinsufflated the abdomen.  The mesh provided  at least 5-10 cm circumferential coverage around the entire region of hernia defects.   I placed stitches centrally x4 to help encouraged in the Bard mesh to adhere to the Parietex mesh.  I tacked the edges & central part of the mesh with SecureStrap absorbable tacks.   Hemostasis was excellent.  I then placed On-Q catheter sheaths in the preperitoneal plane under direct laparoscopic guidance from the subxiphoid region down to the posterior iliac crests in the lower flanks. I did reinspection. Hemostasis was good. Mesh laid well. Capnoperitoneum was evacuated. Ports were removed. The skin was closed with Monocryl at the port sites and Steri-Strips on the fascial stitch puncture sites. OnQ catheters were placed and the sheathes peeled away. On-Q pump was secured. Patient is being extubated to go to the recovery room. I  discussed the patient's status to the family.  Questions were answered.  They expressed understanding & appreciation. Marland Kitchen

## 2012-09-17 NOTE — ED Notes (Signed)
Report given.

## 2012-09-17 NOTE — Anesthesia Postprocedure Evaluation (Signed)
Anesthesia Post Note  Patient: [name]  Procedure(s) Performed: Procedure(s) (LRB): LAPAROSCOPIC VENTRAL HERNIA (N/A) LAPAROSCOPIC and Open  LYSIS OF ADHESIONS (N/A)  Anesthesia type: General  Patient location: PACU  Post pain: Pain level controlled  Post assessment: Post-op Vital signs reviewed  Last Vitals: BP 136/72  Pulse 88  Temp(Src) 37.1 C (Oral)  Resp 15  Ht 5\' 11"  (1.803 m)  Wt 300 lb (136.079 kg)  BMI 41.86 kg/m2  SpO2 93%  Post vital signs: Reviewed  Level of consciousness: sedated  Complications: No apparent anesthesia complications

## 2012-09-17 NOTE — Anesthesia Preprocedure Evaluation (Signed)
Anesthesia Evaluation  Patient identified by MRN, date of birth, ID band Patient awake    Reviewed: Allergy & Precautions, H&P , NPO status , Patient's Chart, lab work & pertinent test results, reviewed documented beta blocker date and time   Airway Mallampati: III TM Distance: >3 FB Neck ROM: Full    Dental  (+) Dental Advisory Given   Pulmonary neg pulmonary ROS,          Cardiovascular hypertension, Pt. on medications and Pt. on home beta blockers negative cardio ROS  + dysrhythmias Atrial Fibrillation     Neuro/Psych negative neurological ROS  negative psych ROS   GI/Hepatic negative GI ROS, Neg liver ROS,   Endo/Other  negative endocrine ROSdiabetes, Type 2, Insulin DependentMorbid obesity  Renal/GU negative Renal ROS     Musculoskeletal negative musculoskeletal ROS (+)   Abdominal (+) + obese,   Peds  Hematology negative hematology ROS (+)   Anesthesia Other Findings   Reproductive/Obstetrics                           Anesthesia Physical Anesthesia Plan  ASA: III  Anesthesia Plan: General   Post-op Pain Management:    Induction: Intravenous  Airway Management Planned: Oral ETT  Additional Equipment:   Intra-op Plan:   Post-operative Plan: Extubation in OR  Informed Consent: I have reviewed the patients History and Physical, chart, labs and discussed the procedure including the risks, benefits and alternatives for the proposed anesthesia with the patient or authorized representative who has indicated his/her understanding and acceptance.   Dental advisory given  Plan Discussed with: CRNA  Anesthesia Plan Comments:         Anesthesia Quick Evaluation

## 2012-09-17 NOTE — Progress Notes (Signed)
General Surgery [facility]  Patient seen and examined.  Discussed with Dr. Johna Sheriff and Dr. Michaell Cowing this AM.  Dr. Michaell Cowing to evaluate this afternoon for probably operative repair.  Patient and wife understand and agree with this plan.  Velora Heckler, MD, [facility] Office: [phone]

## 2012-09-17 NOTE — Telephone Encounter (Signed)
Pt went to ED last night & w SBO due to small periumbilical VWH incarceration

## 2012-09-17 NOTE — ED Notes (Signed)
Pt c/o right sided abd pain; previous episode on Saturday night; cramping since then; bad spell tonight; history of diverticulitis with surgery; history of pancreatitis; nausea on Saturday

## 2012-09-17 NOTE — Progress Notes (Signed)
Inpatient Diabetes Program Recommendations  AACE/ADA: New Consensus Statement on Inpatient Glycemic Control (2013)  Target Ranges:  Prepandial:   less than 140 mg/dL      Peak postprandial:   less than 180 mg/dL (1-2 hours)      Critically ill patients:  140 - 180 mg/dL   Reason for Visit:   Note history of diabetes.  A1C in December was 14.2%.  Patient was on Levemir 60 units prior to admission.  Consider rechecking A1C.  Also decrease correction scale to moderate but increase frequency to q 4 hours while NPO.  May also consider adding partial dose of Levemir.  Could start with Levemir 20 units daily while NPO as a basal insulin.

## 2012-09-17 NOTE — Progress Notes (Signed)
Subjective: Pt doing okay, watching tv, wife at bedside.  Cramping is better with pain medication.  Denies n/v.  Last bm yesterday morning.  Reviewed home medication regimen.  Objective: Vital signs in last 24 hours: Temp:  [97.7 F (36.5 C)] 97.7 F (36.5 C) (05/28 0034) Pulse Rate:  [70] 70 (05/28 0034) Resp:  [18] 18 (05/28 0034) BP: (154)/(102) 154/102 mmHg (05/28 0034) SpO2:  [98 %] 98 % (05/28 0034) Weight:  [300 lb (136.079 kg)] 300 lb (136.079 kg) (05/28 0034)    Intake/Output from previous day:   Intake/Output this shift:    General appearance: alert, cooperative and mild distress Cardio: regular rate and rhythm, S1, S2 normal, no murmur, click, rub or gallop GI: soft, round and distended.  +BS x4 quadrants.  No masses or hernias appreciated on exam  Lab Results:   Recent Labs  09/17/12 0125  WBC 9.5  HGB 15.0  HCT 43.0  PLT 162   BMET  Recent Labs  09/17/12 0125  NA 134*  K 4.0  CL 97  CO2 25  GLUCOSE 250*  BUN 10  CREATININE 0.79  CALCIUM 9.6   PT/INR No results found for this basename: LABPROT, INR,  in the last 72 hours ABG No results found for this basename: PHART, PCO2, PO2, HCO3,  in the last 72 hours  Studies/Results: Ct Abdomen Pelvis W Contrast  09/17/2012   *RADIOLOGY REPORT*  Clinical Data: Right-sided abdominal pain and cramping.  Nausea. Mild leukocytosis, and red blood cells in the urine.  CT ABDOMEN AND PELVIS WITH CONTRAST  Technique:  Multidetector CT imaging of the abdomen and pelvis was performed following the standard protocol during bolus administration of intravenous contrast.  Contrast: OMNIPAQUE IOHEXOL 300 MG/ML  SOLN  Comparison: CT of the abdomen and pelvis performed 03/23/2012  Findings: The visualized lung bases are clear.  The liver and spleen are unremarkable in appearance.  The patient is status post cholecystectomy; clips are noted along the gallbladder fossa.  The pancreas and adrenal glands are unremarkable.   Nonspecific perinephric stranding is noted bilaterally; the kidneys are otherwise unremarkable in appearance.  No definite renal or ureteral stones are seen.  There is no evidence of hydronephrosis.  There is gradual dilution of contrast within the dilated small bowel loop, beginning at the proximal to mid ileum.  Small bowel loops are dilated to 4.3 cm in maximal diameter.  This extends to the level of the mid to distal ileum; a very short segment of ileum herniates into a small left periumbilical hernia, with significant associated fluid and soft tissue inflammation.  This is the transition point, with relatively high-grade bowel obstruction due to the hernia, and decompression of more distal small bowel loops.  However, a small amount of air is seen in the distal ileum.  No free intra-abdominal air is seen to suggest bowel perforation.  The patient's bowel is difficult to follow due to multiple small and large bowel anastomoses.  The visualized anastomoses are grossly unremarkable in appearance.  Mild outpouching is noted at the patient's sigmoid colonic anastomosis.  The stomach is within normal limits.  No acute vascular abnormalities are seen.  Minimal diverticulosis is noted along the proximal sigmoid colon. The remaining colon is otherwise grossly unremarkable.  The bladder is mildly distended and grossly unremarkable in appearance.  The prostate remains normal in size.  No inguinal lymphadenopathy is seen.  No acute osseous abnormalities are identified.  IMPRESSION:  1.  Relatively high-grade small bowel  obstruction, with the transition point noted at a small left periumbilical hernia, where a very short segment of mid to distal ileum is entrapped, with significant associated fluid and soft tissue inflammation.  Dilatation of the more proximal small bowel loops to 4.3 cm in maximal diameter, with gradual dilution of ingested contrast. 2.  Bowel difficult to follow due to multiple small and large bowel  anastomoses; the visualized anastomoses are grossly unremarkable in appearance. 3.  Minimal diverticulosis noted along the proximal sigmoid colon, without evidence of diverticulitis.  These results were called by telephone on 09/17/2012 at 04:14 a.m. to Dr. Donnetta Hutching, who verbally acknowledged these results.   Original Report Authenticated By: Tonia Ghent, M.D.    Anti-infectives: Anti-infectives   None      Assessment/Plan: Small bowel obstruction 2/2 incarcerated periumbilical recurrent incisional hernia -VSS, NAD and stable at this time. -Dr. Michaell Cowing to see the patient this afternoon unless the patient becomes unstable at which time Dr. Gerrit Friends to proceed with surgery.  Discussed with the patient and wife, verbalize understanding and agree with the plan above. -IVF  -pain control -SCDs, hold Lovenox at this time  Diabetes Mellitus -hold levemir, QID accuchecks and sliding scale  Hypertension -add metoprolol with parameters, once able to tolerate orals change to atenolol.    LOS: 0 days    ,  ANP-BC 09/17/2012

## 2012-09-17 NOTE — Progress Notes (Signed)
Large cuff utilized for accurate pressure

## 2012-09-17 NOTE — Transfer of Care (Signed)
Immediate Anesthesia Transfer of Care Note  Patient: [name]  Procedure(s) Performed: Procedure(s): LAPAROSCOPIC VENTRAL HERNIA (N/A) LAPAROSCOPIC and Open  LYSIS OF ADHESIONS (N/A)  Patient Location: PACU  Anesthesia Type:General  Level of Consciousness: awake, alert , oriented and patient cooperative  Airway & Oxygen Therapy: Patient Spontanous Breathing and Patient connected to face mask oxygen  Post-op Assessment: Report given to PACU RN, Post -op Vital signs reviewed and stable and Patient moving all extremities X 4  Post vital signs: Reviewed and stable  Complications: No apparent anesthesia complications

## 2012-09-17 NOTE — H&P (Signed)
[name] is an 47 y.o. male.   Chief Complaint: abdominal pain HPI: patient is a 47 year old male with a surgical history that includes colon resection for diverticulitis approximately 2010. This was apparently complicated by a leak requiring a diverting ileostomy. This was subsequently taken down. He then had laparoscopic repair of a Swiss cheese type hernia defect performed in 2012. All by Dr. Michaell Cowing. He had been doing well until about 3 days ago. He developed some periumbilical pain and nausea after doing yard work. This then eased up after a day or 2 but then he developed again severe pressure-like periumbilical pain about 24 hours ago that has persisted and he presented to the emergency department. He has not had nausea or vomiting in the last 24 hours. Bowel movements OK. No fever or chills.  Past Medical History  Diagnosis Date  . Hypertension   . Diverticulitis   . Diabetes mellitus without complication   . DM type 2 (diabetes mellitus, type 2) 03/24/2012  . Hyperlipidemia 03/24/2012  . Diabetic neuropathy 03/25/2012  . Paroxysmal atrial fibrillation 03/25/2012    Transient, occurred off of atenolol.  . Pancreatitis     Past Surgical History  Procedure Laterality Date  . Colon surgery  04/15/2009  . Appendectomy  04/15/2009  . Laparoscopic cholecystectomy    . Hernia repair  01/25/11    lap ventral hernia repair x9    No family history on file. Social History:  States that he quit smoking in 2012.  He has a 30 pack-year smoking history. He has never used smokeless tobacco. He reports that  drinks alcohol. He reports that he does not use illicit drugs.  Allergies:  Allergies  Allergen Reactions  . Hydrocodone Itching     (Not in a hospital admission)  Results for orders placed during the hospital encounter of 09/17/12 (from the past 48 hour(s))  URINALYSIS, ROUTINE W REFLEX MICROSCOPIC     Status: Abnormal   Collection Time    09/17/12  1:20 AM      Result Value Range    Color, Urine AMBER (*) YELLOW   Comment: BIOCHEMICALS MAY BE AFFECTED BY COLOR   APPearance CLEAR  CLEAR   Specific Gravity, Urine 1.038 (*) 1.005 - 1.030   pH 5.5  5.0 - 8.0   Glucose, UA >1000 (*) NEGATIVE mg/dL   Hgb urine dipstick TRACE (*) NEGATIVE   Bilirubin Urine SMALL (*) NEGATIVE   Ketones, ur NEGATIVE  NEGATIVE mg/dL   Protein, ur >914 (*) NEGATIVE mg/dL   Urobilinogen, UA 1.0  0.0 - 1.0 mg/dL   Nitrite NEGATIVE  NEGATIVE   Leukocytes, UA NEGATIVE  NEGATIVE  URINE MICROSCOPIC-ADD ON     Status: Abnormal   Collection Time    09/17/12  1:20 AM      Result Value Range   Casts HYALINE CASTS (*) NEGATIVE   Crystals CA OXALATE CRYSTALS (*) NEGATIVE   Urine-Other MUCOUS PRESENT    CBC WITH DIFFERENTIAL     Status: None   Collection Time    09/17/12  1:25 AM      Result Value Range   WBC 9.5  4.0 - 10.5 K/uL   RBC 4.47  4.22 - 5.81 MIL/uL   Hemoglobin 15.0  13.0 - 17.0 g/dL   HCT 78.2  95.6 - 21.3 %   MCV 96.2  78.0 - 100.0 fL   MCH 33.6  26.0 - 34.0 pg   MCHC 34.9  30.0 - 36.0 g/dL  RDW 13.6  11.5 - 15.5 %   Platelets 162  150 - 400 K/uL   Neutrophils Relative % 59  43 - 77 %   Neutro Abs 5.6  1.7 - 7.7 K/uL   Lymphocytes Relative 29  12 - 46 %   Lymphs Abs 2.8  0.7 - 4.0 K/uL   Monocytes Relative 10  3 - 12 %   Monocytes Absolute 1.0  0.1 - 1.0 K/uL   Eosinophils Relative 1  0 - 5 %   Eosinophils Absolute 0.1  0.0 - 0.7 K/uL   Basophils Relative 0  0 - 1 %   Basophils Absolute 0.0  0.0 - 0.1 K/uL  COMPREHENSIVE METABOLIC PANEL     Status: Abnormal   Collection Time    09/17/12  1:25 AM      Result Value Range   Sodium 134 (*) 135 - 145 mEq/L   Potassium 4.0  3.5 - 5.1 mEq/L   Chloride 97  96 - 112 mEq/L   CO2 25  19 - 32 mEq/L   Glucose, Bld 250 (*) 70 - 99 mg/dL   BUN 10  6 - 23 mg/dL   Creatinine, Ser 1.61  0.50 - 1.35 mg/dL   Calcium 9.6  8.4 - 09.6 mg/dL   Total Protein 7.3  6.0 - 8.3 g/dL   Albumin 3.4 (*) 3.5 - 5.2 g/dL   AST 42 (*) 0 - 37 U/L    ALT 59 (*) 0 - 53 U/L   Alkaline Phosphatase 74  39 - 117 U/L   Total Bilirubin 0.4  0.3 - 1.2 mg/dL   GFR calc non Af Amer >90  >90 mL/min   GFR calc Af Amer >90  >90 mL/min   Comment:            The eGFR has been calculated     using the CKD EPI equation.     This calculation has not been     validated in all clinical     situations.     eGFR's persistently     <90 mL/min signify     possible Chronic Kidney Disease.  LIPASE, BLOOD     Status: None   Collection Time    09/17/12  1:25 AM      Result Value Range   Lipase 16  11 - 59 U/L   Ct Abdomen Pelvis W Contrast  09/17/2012   *RADIOLOGY REPORT*  Clinical Data: Right-sided abdominal pain and cramping.  Nausea. Mild leukocytosis, and red blood cells in the urine.  CT ABDOMEN AND PELVIS WITH CONTRAST  Technique:  Multidetector CT imaging of the abdomen and pelvis was performed following the standard protocol during bolus administration of intravenous contrast.  Contrast: OMNIPAQUE IOHEXOL 300 MG/ML  SOLN  Comparison: CT of the abdomen and pelvis performed 03/23/2012  Findings: The visualized lung bases are clear.  The liver and spleen are unremarkable in appearance.  The patient is status post cholecystectomy; clips are noted along the gallbladder fossa.  The pancreas and adrenal glands are unremarkable.  Nonspecific perinephric stranding is noted bilaterally; the kidneys are otherwise unremarkable in appearance.  No definite renal or ureteral stones are seen.  There is no evidence of hydronephrosis.  There is gradual dilution of contrast within the dilated small bowel loop, beginning at the proximal to mid ileum.  Small bowel loops are dilated to 4.3 cm in maximal diameter.  This extends to the level of the mid to distal  ileum; a very short segment of ileum herniates into a small left periumbilical hernia, with significant associated fluid and soft tissue inflammation.  This is the transition point, with relatively high-grade bowel  obstruction due to the hernia, and decompression of more distal small bowel loops.  However, a small amount of air is seen in the distal ileum.  No free intra-abdominal air is seen to suggest bowel perforation.  The patient's bowel is difficult to follow due to multiple small and large bowel anastomoses.  The visualized anastomoses are grossly unremarkable in appearance.  Mild outpouching is noted at the patient's sigmoid colonic anastomosis.  The stomach is within normal limits.  No acute vascular abnormalities are seen.  Minimal diverticulosis is noted along the proximal sigmoid colon. The remaining colon is otherwise grossly unremarkable.  The bladder is mildly distended and grossly unremarkable in appearance.  The prostate remains normal in size.  No inguinal lymphadenopathy is seen.  No acute osseous abnormalities are identified.  IMPRESSION:  1.  Relatively high-grade small bowel obstruction, with the transition point noted at a small left periumbilical hernia, where a very short segment of mid to distal ileum is entrapped, with significant associated fluid and soft tissue inflammation.  Dilatation of the more proximal small bowel loops to 4.3 cm in maximal diameter, with gradual dilution of ingested contrast. 2.  Bowel difficult to follow due to multiple small and large bowel anastomoses; the visualized anastomoses are grossly unremarkable in appearance. 3.  Minimal diverticulosis noted along the proximal sigmoid colon, without evidence of diverticulitis.  These results were called by telephone on 09/17/2012 at 04:14 a.m. to Dr. Donnetta Hutching, who verbally acknowledged these results.   Original Report Authenticated By: Tonia Ghent, M.D.    Review of Systems  Constitutional: Negative for fever and chills.  Respiratory: Negative.   Cardiovascular: Negative.   Gastrointestinal: Positive for abdominal pain. Negative for nausea and vomiting.  Genitourinary: Negative.     Blood pressure 154/102, pulse 70,  temperature 97.7 F (36.5 C), temperature source Oral, resp. rate 18, height 5\' 11"  (1.803 m), weight 300 lb (136.079 kg), SpO2 98.00%. Physical Exam  General: Alert, obese Caucasian male, in no distress Skin: Warm and dry without rash or infection. HEENT: No palpable masses or thyromegaly. Sclera nonicteric. Pupils equal round and reactive. Oropharynx clear. Lymph nodes: No cervical, supraclavicular, or inguinal nodes palpable. Lungs: Breath sounds clear and equal without increased work of breathing Cardiovascular: Regular rate and rhythm without murmur. No JVD or edema. Peripheral pulses intact. Abdomen: obese without apparent distention. Bowel sounds present. Large low midline incision. Large pannus. There is periumbilical tenderness mostly on the left side. I cannot feel any definite mass but he is quite obese. Abdomen is generally soft and nontender. Extremities: No edema or joint swelling or deformity. No chronic venous stasis changes. Neurologic: Alert and fully oriented. Gait normal.  Assessment/Plan Small bowel obstruction apparently secondary to a small loop of incarcerated ileum in a periumbilical recurrent incisional hernia. The patient is being admitted and likely require emergency repair.  , T 09/17/2012, 5:54 AM

## 2012-09-17 NOTE — ED Provider Notes (Signed)
History     CSN: [id number]  Arrival date & time 09/17/12  0026   First MD Initiated Contact with Patient 09/17/12 0046      Chief Complaint  Patient presents with  . Abdominal Pain    (Consider location/radiation/quality/duration/timing/severity/associated sxs/prior treatment) HPI.... right upper quadrant and epigastric abdominal pain since Saturday night described as cramping.  Decreased oral intake.  Patient has history of diverticulitis leading to surgery and complicated with ventral hernias.  He has been operated on locally by Dr. Michaell Cowing.  Severity is moderate to severe. No radiation of pain.  No fever, chills, diarrhea.     Past Medical History  Diagnosis Date  . Hypertension   . Diverticulitis   . Diabetes mellitus without complication   . DM type 2 (diabetes mellitus, type 2) 03/24/2012  . Hyperlipidemia 03/24/2012  . Diabetic neuropathy 03/25/2012  . Paroxysmal atrial fibrillation 03/25/2012    Transient, occurred off of atenolol.  . Pancreatitis     Past Surgical History  Procedure Laterality Date  . Colon surgery  04/15/2009  . Appendectomy  04/15/2009  . Laparoscopic cholecystectomy    . Hernia repair  01/25/11    lap ventral hernia repair x9    No family history on file.  History  Substance Use Topics  . Smoking status: Current Every Day Smoker -- 1.00 packs/day for 30 years    Types: Cigarettes  . Smokeless tobacco: Never Used  . Alcohol Use: Yes      Review of Systems  All other systems reviewed and are negative.    Allergies  Hydrocodone  Home Medications   Current Outpatient Rx  Name  Route  Sig  Dispense  Refill  . allopurinol (ZYLOPRIM) 300 MG tablet   Oral   Take 300 mg by mouth daily.          Marland Kitchen atenolol (TENORMIN) 100 MG tablet   Oral   Take 100 mg by mouth daily.         Marland Kitchen CINNAMON PO   Oral   Take by mouth.         . colchicine 0.6 MG tablet   Oral   Take 0.6 mg by mouth every Tuesday, Thursday, Saturday, and  Sunday.         . gabapentin (NEURONTIN) 100 MG capsule   Oral   Take 1-2 capsules (100-200 mg total) by mouth at bedtime as needed. For burning sensation in feet.   30 capsule   0   . insulin aspart (NOVOLOG) 100 UNIT/ML injection   Subcutaneous   Inject 7 Units into the skin every evening.         . insulin detemir (LEVEMIR) 100 UNIT/ML injection   Subcutaneous   Inject 60 Units into the skin daily.         Marland Kitchen MAGNESIUM-ZINC PO   Oral   Take 1 tablet by mouth daily.         . Multiple Vitamin (MULTIVITAMIN WITH MINERALS) TABS   Oral   Take 1 tablet by mouth daily.         Marland Kitchen omeprazole (PRILOSEC) 40 MG capsule   Oral   Take 40 mg by mouth every Tuesday, Thursday, Saturday, and Sunday.           BP 154/102  Pulse 70  Temp(Src) 97.7 F (36.5 C) (Oral)  Resp 18  Ht 5\' 11"  (1.803 m)  Wt 300 lb (136.079 kg)  BMI 41.86 kg/m2  SpO2 98%  Physical  Exam  Nursing note and vitals reviewed. Constitutional: He is oriented to person, place, and time. He appears well-developed and well-nourished.  HENT:  Head: Normocephalic and atraumatic.  Eyes: Conjunctivae and EOM are normal. Pupils are equal, round, and reactive to light.  Neck: Normal range of motion. Neck supple.  Cardiovascular: Normal rate, regular rhythm and normal heart sounds.   Pulmonary/Chest: Effort normal and breath sounds normal.  Abdominal: Soft. Bowel sounds are normal.  Tender epigastrium and right quadrant;  old surgical scars.  Musculoskeletal: Normal range of motion.  Neurological: He is alert and oriented to person, place, and time.  Skin: Skin is warm and dry.  Psychiatric: He has a normal mood and affect.    ED Course  Procedures (including critical care time)  Labs Reviewed  COMPREHENSIVE METABOLIC PANEL - Abnormal; Notable for the following:    Sodium 134 (*)    Glucose, Bld 250 (*)    Albumin 3.4 (*)    AST 42 (*)    ALT 59 (*)    All other components within normal limits   URINALYSIS, ROUTINE W REFLEX MICROSCOPIC - Abnormal; Notable for the following:    Color, Urine AMBER (*)    Specific Gravity, Urine 1.038 (*)    Glucose, UA >1000 (*)    Hgb urine dipstick TRACE (*)    Bilirubin Urine SMALL (*)    Protein, ur >300 (*)    All other components within normal limits  URINE MICROSCOPIC-ADD ON - Abnormal; Notable for the following:    Casts HYALINE CASTS (*)    Crystals CA OXALATE CRYSTALS (*)    All other components within normal limits  CBC WITH DIFFERENTIAL  LIPASE, BLOOD   Ct Abdomen Pelvis W Contrast  09/17/2012   *RADIOLOGY REPORT*  Clinical Data: Right-sided abdominal pain and cramping.  Nausea. Mild leukocytosis, and red blood cells in the urine.  CT ABDOMEN AND PELVIS WITH CONTRAST  Technique:  Multidetector CT imaging of the abdomen and pelvis was performed following the standard protocol during bolus administration of intravenous contrast.  Contrast: OMNIPAQUE IOHEXOL 300 MG/ML  SOLN  Comparison: CT of the abdomen and pelvis performed 03/23/2012  Findings: The visualized lung bases are clear.  The liver and spleen are unremarkable in appearance.  The patient is status post cholecystectomy; clips are noted along the gallbladder fossa.  The pancreas and adrenal glands are unremarkable.  Nonspecific perinephric stranding is noted bilaterally; the kidneys are otherwise unremarkable in appearance.  No definite renal or ureteral stones are seen.  There is no evidence of hydronephrosis.  There is gradual dilution of contrast within the dilated small bowel loop, beginning at the proximal to mid ileum.  Small bowel loops are dilated to 4.3 cm in maximal diameter.  This extends to the level of the mid to distal ileum; a very short segment of ileum herniates into a small left periumbilical hernia, with significant associated fluid and soft tissue inflammation.  This is the transition point, with relatively high-grade bowel obstruction due to the hernia, and  decompression of more distal small bowel loops.  However, a small amount of air is seen in the distal ileum.  No free intra-abdominal air is seen to suggest bowel perforation.  The patient's bowel is difficult to follow due to multiple small and large bowel anastomoses.  The visualized anastomoses are grossly unremarkable in appearance.  Mild outpouching is noted at the patient's sigmoid colonic anastomosis.  The stomach is within normal limits.  No  acute vascular abnormalities are seen.  Minimal diverticulosis is noted along the proximal sigmoid colon. The remaining colon is otherwise grossly unremarkable.  The bladder is mildly distended and grossly unremarkable in appearance.  The prostate remains normal in size.  No inguinal lymphadenopathy is seen.  No acute osseous abnormalities are identified.  IMPRESSION:  1.  Relatively high-grade small bowel obstruction, with the transition point noted at a small left periumbilical hernia, where a very short segment of mid to distal ileum is entrapped, with significant associated fluid and soft tissue inflammation.  Dilatation of the more proximal small bowel loops to 4.3 cm in maximal diameter, with gradual dilution of ingested contrast. 2.  Bowel difficult to follow due to multiple small and large bowel anastomoses; the visualized anastomoses are grossly unremarkable in appearance. 3.  Minimal diverticulosis noted along the proximal sigmoid colon, without evidence of diverticulitis.  These results were called by telephone on 09/17/2012 at 04:14 a.m. to Dr. Donnetta Hutching, who verbally acknowledged these results.   Original Report Authenticated By: Tonia Ghent, M.D.     1. Small bowel obstruction    CRITICAL CARE Performed by: Donnetta Hutching  ?  Total critical care time: 30  Critical care time was exclusive of separately billable procedures and treating other patients.  Critical care was necessary to treat or prevent imminent or life-threatening  deterioration.  Critical care was time spent personally by me on the following activities: development of treatment plan with patient and/or surrogate as well as nursing, discussions with consultants, evaluation of patient's response to treatment, examination of patient, obtaining history from patient or surrogate, ordering and performing treatments and interventions, ordering and review of laboratory studies, ordering and review of radiographic studies, pulse oximetry and re-evaluation of patient's condition.   MDM  CT scan reveals high-grade bowel structure with entrapment of distal ileum.   IV hydration, pain management, referral to general surgery        Donnetta Hutching, MD 09/17/12 [phone]

## 2012-09-17 NOTE — Interval H&P Note (Signed)
History and Physical Interval Note:  09/17/2012 1:47 PM  [name]  has presented today for surgery, with the diagnosis of recurrent ventral hernia  The various methods of treatment have been discussed with the patient and family. After consideration of risks, benefits and other options for treatment, the patient has consented to  Procedure(s): LAPAROSCOPIC VENTRAL HERNIA (N/A) as a surgical intervention .  The patient's history has been reviewed, patient examined, no change in status, stable for surgery.  I have reviewed the patient's chart and labs.  Questions were answered to the patient's satisfaction.    The anatomy & physiology of the abdominal wall was discussed.  The pathophysiology of hernias was discussed.  Natural history risks without surgery including progeressive enlargement, pain, incarceration & strangulation was discussed.   Contributors to complications such as smoking, obesity, diabetes, prior surgery, etc were discussed.   I feel the risks of no intervention will lead to serious problems that outweigh the operative risks; therefore, I recommended surgery to reduce and repair the hernia.  I explained laparoscopic techniques with possible need for an open approach.  I noted the probable use of mesh to patch and/or buttress the hernia repair  Risks such as bleeding, infection, abscess, need for further treatment, heart attack, death, and other risks were discussed.  I noted a good likelihood this will help address the problem.   Goals of post-operative recovery were discussed as well.  Possibility that this will not correct all symptoms was explained.  I stressed the importance of low-impact activity, aggressive pain control, avoiding constipation, & not pushing through pain to minimize risk of post-operative chronic pain or injury. Possibility of reherniation especially with smoking, obesity, diabetes, immunosuppression, and other health conditions was discussed.  We will work to  minimize complications.     An educational handout further explaining the pathology & treatment options was given as well.  Questions were answered.  The patient expresses understanding & wishes to proceed with surgery.     , C.

## 2012-09-18 ENCOUNTER — Encounter (HOSPITAL_COMMUNITY): Payer: Self-pay | Admitting: Surgery

## 2012-09-18 DIAGNOSIS — K565 Intestinal adhesions [bands], unspecified as to partial versus complete obstruction: Secondary | ICD-10-CM

## 2012-09-18 LAB — COMPREHENSIVE METABOLIC PANEL
ALT: 53 U/L (ref 0–53)
CO2: 30 mEq/L (ref 19–32)
Calcium: 8.4 mg/dL (ref 8.4–10.5)
Chloride: 97 mEq/L (ref 96–112)
Creatinine, Ser: 0.88 mg/dL (ref 0.50–1.35)
GFR calc Af Amer: >90 mL/min (ref >90)
GFR calc non Af Amer: >90 mL/min (ref >90)
Glucose, Bld: 146 mg/dL — ABNORMAL HIGH (ref 70–99)
Sodium: 133 mEq/L — ABNORMAL LOW (ref 135–145)
Total Bilirubin: 0.7 mg/dL (ref 0.3–1.2)

## 2012-09-18 LAB — CBC
Hemoglobin: 12.9 g/dL — ABNORMAL LOW (ref 13.0–17.0)
MCH: 32.4 pg (ref 26.0–34.0)
MCV: 98.7 fL (ref 78.0–100.0)
RBC: 3.98 MIL/uL — ABNORMAL LOW (ref 4.22–5.81)
WBC: 6.8 10*3/uL (ref 4.0–10.5)

## 2012-09-18 LAB — GLUCOSE, CAPILLARY: Glucose-Capillary: 148 mg/dL — ABNORMAL HIGH (ref 70–99)

## 2012-09-18 LAB — HEMOGLOBIN A1C: Mean Plasma Glucose: 177 mg/dL — ABNORMAL HIGH (ref <117)

## 2012-09-18 MED ORDER — ENOXAPARIN SODIUM 30 MG/0.3ML ~~LOC~~ SOLN
30.0000 mg | Freq: Two times a day (BID) | SUBCUTANEOUS | Status: DC
  Administered 2012-09-18 – 2012-09-20 (×5): 30 mg via SUBCUTANEOUS
  Filled 2012-09-18 (×6): qty 0.3

## 2012-09-18 MED ORDER — KETOROLAC TROMETHAMINE 15 MG/ML IJ SOLN
30.0000 mg | Freq: Four times a day (QID) | INTRAMUSCULAR | Status: DC
  Administered 2012-09-18 – 2012-09-19 (×5): 30 mg via INTRAVENOUS
  Filled 2012-09-18 (×3): qty 2
  Filled 2012-09-18: qty 1
  Filled 2012-09-18 (×3): qty 2
  Filled 2012-09-18: qty 1
  Filled 2012-09-18: qty 2

## 2012-09-18 MED ORDER — HYDROMORPHONE HCL PF 1 MG/ML IJ SOLN
1.0000 mg | INTRAMUSCULAR | Status: DC | PRN
  Administered 2012-09-18 – 2012-09-19 (×4): 2 mg via INTRAVENOUS
  Filled 2012-09-18 (×4): qty 2

## 2012-09-18 MED ORDER — LACTATED RINGERS IV BOLUS (SEPSIS): 1000.0000 mL | Freq: Three times a day (TID) | INTRAVENOUS | Status: AC | PRN

## 2012-09-18 MED ORDER — NAPROXEN 500 MG PO TABS
500.0000 mg | ORAL_TABLET | Freq: Two times a day (BID) | ORAL | Status: DC
  Filled 2012-09-18 (×3): qty 1

## 2012-09-18 MED ORDER — INSULIN ASPART 100 UNIT/ML ~~LOC~~ SOLN
0.0000 [IU] | SUBCUTANEOUS | Status: DC
  Administered 2012-09-18: 3 [IU] via SUBCUTANEOUS
  Administered 2012-09-18: 2 [IU] via SUBCUTANEOUS
  Administered 2012-09-18: 3 [IU] via SUBCUTANEOUS
  Administered 2012-09-19 (×2): 2 [IU] via SUBCUTANEOUS
  Administered 2012-09-19: 5 [IU] via SUBCUTANEOUS

## 2012-09-18 MED ORDER — INSULIN ASPART 100 UNIT/ML ~~LOC~~ SOLN
7.0000 [IU] | Freq: Every evening | SUBCUTANEOUS | Status: DC
  Administered 2012-09-18 – 2012-09-19 (×2): 7 [IU] via SUBCUTANEOUS

## 2012-09-18 NOTE — Progress Notes (Addendum)
Inpatient Diabetes Program Recommendations  AACE/ADA: New Consensus Statement on Inpatient Glycemic Control (2013)  Target Ranges:  Prepandial:   less than 140 mg/dL      Peak postprandial:   less than 180 mg/dL (1-2 hours)      Critically ill patients:  140 - 180 mg/dL   Reason for Visit: Referral received.  Spoke to patient and wife.  Insulin was started in December, 2013 but A1C has not been rechecked.  Note A1C ordered.  Consider adding Levemir 30 units daily (1/2 of home dose) as basal insulin.  Gave them name and number of dietician at [facility] for diet follow-up outpatient.  Also recommend that he follow-up with Dr. Margo Aye.  He is checking CBG's at home and wife states they are in the 200's.         Levimir already started 5/29  Ardeth Sportsman, M.D., F.A.C.S. Gastrointestinal and Minimally Invasive Surgery [facility] [address] [place], [place] [address] [phone] Main / Paging

## 2012-09-18 NOTE — Progress Notes (Signed)
[name] [id number] [birth year]  CARE TEAM:  PCP: Catalina Pizza, MD  Outpatient Care Team: Patient Care Team: Catalina Pizza, MD as PCP - General (Internal Medicine)  Inpatient Treatment Team: Treatment Team: Attending Provider: Ardeth Sportsman, MD; Registered Nurse: Jonathon Bellows, RN; Technician: Vella Raring, NT   Subjective:  Very sore at right shoulder Abdomen mildly tender No nausea/vomiting Walked in hallways x1  Objective:  Vital signs:  Filed Vitals:   09/17/12 2250 09/17/12 2350 09/18/12 0200 09/18/12 0606  BP: 133/86 122/78 132/83 134/85  Pulse: 88 91 88 90  Temp: 97.7 F (36.5 C) 98.1 F (36.7 C) 97.9 F (36.6 C) 99.2 F (37.3 C)  TempSrc: Oral Oral Oral Oral  Resp: 18 18 18 18   Height:      Weight:      SpO2: 95% 95% 96% 93%    Last BM Date: 09/17/12  Intake/Output   Yesterday:  05/28 0701 - 05/29 0700 In: 4181.3 [I.V.:4181.3] Out: 2230 [Urine:2050; Drains:80; Blood:100] This shift:     Bowel function:  Flatus: n  BM: n  Drain: Serosanguinous  Physical Exam:  General: Pt awake/alert/oriented x4 in no acute distress Eyes: PERRL, normal EOM.  Sclera clear.  No icterus Neuro: CN II-XII intact w/o focal sensory/motor deficits. Lymph: No head/neck/groin lymphadenopathy Psych:  No delerium/psychosis/paranoia HENT: Normocephalic, Mucus membranes moist.  No thrush Neck: Supple, No tracheal deviation Chest: No chest wall pain w good excursion CV:  Pulses intact.  Regular rhythm MS: Normal AROM mjr joints x right shoulder "stiff".  No joint swelling/heat.  No obvious deformity Abdomen: Soft.  Nondistended.  Mildly tender at incisions only.  No evidence of peritonitis.  No incarcerated hernias. Ext:  SCDs BLE.  No mjr edema.  No cyanosis Skin: No petechiae / purpura   Problem List:   Principal Problem:   Incisional hernia, incarcerated Active Problems:   Morbid obesity   DM type 2 (diabetes mellitus, type 2)   SBO (small bowel  obstruction)   Assessment  [name]  47 y.o. male  1 Day Post-Op  Procedure(s): LAPAROSCOPIC VENTRAL HERNIA LAPAROSCOPIC and Open  LYSIS OF ADHESIONS  Recovering s/p LOA & reduction/repair of Richter's type VWH through mesh  Plan:  -thins liq ad lib.  Do not advance until ileus resolves -add NSAID.  Inc dilaudid.  Continue OnQ, ice, heat -gout Tx -if shoulder not better by tomorrow - Xrays -DM control - added Levimir & QHS Novalog back on.  Check HgbA1c -VTE prophylaxis- SCDs, lovenox SQ -mobilize as tolerated to help recovery  Ardeth Sportsman, M.D., F.A.C.S. Gastrointestinal and Minimally Invasive Surgery [facility] [address] [place], [place] [address] [phone] Main / Paging   09/18/2012   Results:   Labs: Results for orders placed during the hospital encounter of 09/17/12 (from the past 48 hour(s))  URINALYSIS, ROUTINE W REFLEX MICROSCOPIC     Status: Abnormal   Collection Time    09/17/12  1:20 AM      Result Value Range   Color, Urine AMBER (*) YELLOW   Comment: BIOCHEMICALS MAY BE AFFECTED BY COLOR   APPearance CLEAR  CLEAR   Specific Gravity, Urine 1.038 (*) 1.005 - 1.030   pH 5.5  5.0 - 8.0   Glucose, UA >1000 (*) NEGATIVE mg/dL   Hgb urine dipstick TRACE (*) NEGATIVE   Bilirubin Urine SMALL (*) NEGATIVE   Ketones, ur NEGATIVE  NEGATIVE mg/dL   Protein, ur >010 (*)  NEGATIVE mg/dL   Urobilinogen, UA 1.0  0.0 - 1.0 mg/dL   Nitrite NEGATIVE  NEGATIVE   Leukocytes, UA NEGATIVE  NEGATIVE  URINE MICROSCOPIC-ADD ON     Status: Abnormal   Collection Time    09/17/12  1:20 AM      Result Value Range   Casts HYALINE CASTS (*) NEGATIVE   Crystals CA OXALATE CRYSTALS (*) NEGATIVE   Urine-Other MUCOUS PRESENT    CBC WITH DIFFERENTIAL     Status: None   Collection Time    09/17/12  1:25 AM      Result Value Range   WBC 9.5  4.0 - 10.5 K/uL   RBC 4.47  4.22 - 5.81 MIL/uL   Hemoglobin 15.0  13.0 - 17.0 g/dL    HCT 81.1  91.4 - 78.2 %   MCV 96.2  78.0 - 100.0 fL   MCH 33.6  26.0 - 34.0 pg   MCHC 34.9  30.0 - 36.0 g/dL   RDW 95.6  21.3 - 08.6 %   Platelets 162  150 - 400 K/uL   Neutrophils Relative % 59  43 - 77 %   Neutro Abs 5.6  1.7 - 7.7 K/uL   Lymphocytes Relative 29  12 - 46 %   Lymphs Abs 2.8  0.7 - 4.0 K/uL   Monocytes Relative 10  3 - 12 %   Monocytes Absolute 1.0  0.1 - 1.0 K/uL   Eosinophils Relative 1  0 - 5 %   Eosinophils Absolute 0.1  0.0 - 0.7 K/uL   Basophils Relative 0  0 - 1 %   Basophils Absolute 0.0  0.0 - 0.1 K/uL  COMPREHENSIVE METABOLIC PANEL     Status: Abnormal   Collection Time    09/17/12  1:25 AM      Result Value Range   Sodium 134 (*) 135 - 145 mEq/L   Potassium 4.0  3.5 - 5.1 mEq/L   Chloride 97  96 - 112 mEq/L   CO2 25  19 - 32 mEq/L   Glucose, Bld 250 (*) 70 - 99 mg/dL   BUN 10  6 - 23 mg/dL   Creatinine, Ser 5.78  0.50 - 1.35 mg/dL   Calcium 9.6  8.4 - 46.9 mg/dL   Total Protein 7.3  6.0 - 8.3 g/dL   Albumin 3.4 (*) 3.5 - 5.2 g/dL   AST 42 (*) 0 - 37 U/L   ALT 59 (*) 0 - 53 U/L   Alkaline Phosphatase 74  39 - 117 U/L   Total Bilirubin 0.4  0.3 - 1.2 mg/dL   GFR calc non Af Amer >90  >90 mL/min   GFR calc Af Amer >90  >90 mL/min   Comment:            The eGFR has been calculated     using the CKD EPI equation.     This calculation has not been     validated in all clinical     situations.     eGFR's persistently     <90 mL/min signify     possible Chronic Kidney Disease.  LIPASE, BLOOD     Status: None   Collection Time    09/17/12  1:25 AM      Result Value Range   Lipase 16  11 - 59 U/L  GLUCOSE, CAPILLARY     Status: Abnormal   Collection Time    09/17/12  8:29 AM  Result Value Range   Glucose-Capillary 188 (*) 70 - 99 mg/dL  MRSA PCR SCREENING     Status: None   Collection Time    09/17/12  9:09 AM      Result Value Range   MRSA by PCR NEGATIVE  NEGATIVE   Comment:            The GeneXpert MRSA Assay (FDA     approved for  NASAL specimens     only), is one component of a     comprehensive MRSA colonization     surveillance program. It is not     intended to diagnose MRSA     infection nor to guide or     monitor treatment for     MRSA infections.  GLUCOSE, CAPILLARY     Status: Abnormal   Collection Time    09/17/12 11:07 AM      Result Value Range   Glucose-Capillary 175 (*) 70 - 99 mg/dL   Comment 1 Notify RN    GLUCOSE, CAPILLARY     Status: Abnormal   Collection Time    09/17/12  7:50 PM      Result Value Range   Glucose-Capillary 220 (*) 70 - 99 mg/dL  GLUCOSE, CAPILLARY     Status: Abnormal   Collection Time    09/17/12 11:51 PM      Result Value Range   Glucose-Capillary 195 (*) 70 - 99 mg/dL  GLUCOSE, CAPILLARY     Status: Abnormal   Collection Time    09/18/12  4:14 AM      Result Value Range   Glucose-Capillary 138 (*) 70 - 99 mg/dL  CBC     Status: Abnormal   Collection Time    09/18/12  5:15 AM      Result Value Range   WBC 6.8  4.0 - 10.5 K/uL   RBC 3.98 (*) 4.22 - 5.81 MIL/uL   Hemoglobin 12.9 (*) 13.0 - 17.0 g/dL   HCT 40.9  81.1 - 91.4 %   MCV 98.7  78.0 - 100.0 fL   MCH 32.4  26.0 - 34.0 pg   MCHC 32.8  30.0 - 36.0 g/dL   RDW 78.2  95.6 - 21.3 %   Platelets 134 (*) 150 - 400 K/uL  COMPREHENSIVE METABOLIC PANEL     Status: Abnormal   Collection Time    09/18/12  5:15 AM      Result Value Range   Sodium 133 (*) 135 - 145 mEq/L   Potassium 4.1  3.5 - 5.1 mEq/L   Chloride 97  96 - 112 mEq/L   CO2 30  19 - 32 mEq/L   Glucose, Bld 146 (*) 70 - 99 mg/dL   BUN 7  6 - 23 mg/dL   Creatinine, Ser 0.86  0.50 - 1.35 mg/dL   Calcium 8.4  8.4 - 57.8 mg/dL   Total Protein 6.2  6.0 - 8.3 g/dL   Albumin 2.9 (*) 3.5 - 5.2 g/dL   AST 73 (*) 0 - 37 U/L   ALT 53  0 - 53 U/L   Alkaline Phosphatase 50  39 - 117 U/L   Total Bilirubin 0.7  0.3 - 1.2 mg/dL   GFR calc non Af Amer >90  >90 mL/min   GFR calc Af Amer >90  >90 mL/min   Comment:            The eGFR has been calculated  using the CKD EPI equation.     This calculation has not been     validated in all clinical     situations.     eGFR's persistently     <90 mL/min signify     possible Chronic Kidney Disease.  GLUCOSE, CAPILLARY     Status: Abnormal   Collection Time    09/18/12  7:52 AM      Result Value Range   Glucose-Capillary 149 (*) 70 - 99 mg/dL    Imaging / Studies: Ct Abdomen Pelvis W Contrast  09/17/2012   *RADIOLOGY REPORT*  Clinical Data: Right-sided abdominal pain and cramping.  Nausea. Mild leukocytosis, and red blood cells in the urine.  CT ABDOMEN AND PELVIS WITH CONTRAST  Technique:  Multidetector CT imaging of the abdomen and pelvis was performed following the standard protocol during bolus administration of intravenous contrast.  Contrast: OMNIPAQUE IOHEXOL 300 MG/ML  SOLN  Comparison: CT of the abdomen and pelvis performed 03/23/2012  Findings: The visualized lung bases are clear.  The liver and spleen are unremarkable in appearance.  The patient is status post cholecystectomy; clips are noted along the gallbladder fossa.  The pancreas and adrenal glands are unremarkable.  Nonspecific perinephric stranding is noted bilaterally; the kidneys are otherwise unremarkable in appearance.  No definite renal or ureteral stones are seen.  There is no evidence of hydronephrosis.  There is gradual dilution of contrast within the dilated small bowel loop, beginning at the proximal to mid ileum.  Small bowel loops are dilated to 4.3 cm in maximal diameter.  This extends to the level of the mid to distal ileum; a very short segment of ileum herniates into a small left periumbilical hernia, with significant associated fluid and soft tissue inflammation.  This is the transition point, with relatively high-grade bowel obstruction due to the hernia, and decompression of more distal small bowel loops.  However, a small amount of air is seen in the distal ileum.  No free intra-abdominal air is seen to suggest  bowel perforation.  The patient's bowel is difficult to follow due to multiple small and large bowel anastomoses.  The visualized anastomoses are grossly unremarkable in appearance.  Mild outpouching is noted at the patient's sigmoid colonic anastomosis.  The stomach is within normal limits.  No acute vascular abnormalities are seen.  Minimal diverticulosis is noted along the proximal sigmoid colon. The remaining colon is otherwise grossly unremarkable.  The bladder is mildly distended and grossly unremarkable in appearance.  The prostate remains normal in size.  No inguinal lymphadenopathy is seen.  No acute osseous abnormalities are identified.  IMPRESSION:  1.  Relatively high-grade small bowel obstruction, with the transition point noted at a small left periumbilical hernia, where a very short segment of mid to distal ileum is entrapped, with significant associated fluid and soft tissue inflammation.  Dilatation of the more proximal small bowel loops to 4.3 cm in maximal diameter, with gradual dilution of ingested contrast. 2.  Bowel difficult to follow due to multiple small and large bowel anastomoses; the visualized anastomoses are grossly unremarkable in appearance. 3.  Minimal diverticulosis noted along the proximal sigmoid colon, without evidence of diverticulitis.  These results were called by telephone on 09/17/2012 at 04:14 a.m. to Dr. Donnetta Hutching, who verbally acknowledged these results.   Original Report Authenticated By: Tonia Ghent, M.D.    Medications / Allergies: per chart  Antibiotics: Anti-infectives   Start     Dose/Rate Route Frequency Ordered  Stop   09/17/12 2200  ceFAZolin (ANCEF) IVPB 2 g/50 mL premix     2 g 100 mL/hr over 30 Minutes Intravenous 3 times per day 09/17/12 2104 09/18/12 0623   09/17/12 2115  clindamycin (CLEOCIN) 900 mg, gentamicin (GARAMYCIN) 240 mg in sodium chloride 0.9 % 1,000 mL for intraperitoneal lavage  Status:  Discontinued     1 application  Intraperitoneal To Surgery 09/17/12 2104 09/17/12 2110   09/17/12 1730  clindamycin (CLEOCIN) 900 mg, gentamicin (GARAMYCIN) 240 mg in sodium chloride 0.9 % 1,000 mL for intraperitoneal lavage      Intraperitoneal To Surgery 09/17/12 1726 09/17/12 1839   09/17/12 0902  ceFAZolin (ANCEF) 3 g in dextrose 5 % 50 mL IVPB    Comments:  Pharmacy may adjust dosing strength, interval, or rate of medication as needed for optimal therapy for the patient  Send with patient on call to the OR.  Anesthesia to complete antibiotic administration <54min prior to incision per [facility].   3 g 160 mL/hr over 30 Minutes Intravenous 30 min pre-op 09/17/12 0902 09/17/12 1350   09/17/12 0901  ceFAZolin (ANCEF) IVPB 2 g/50 mL premix  Status:  Discontinued    Comments:  Pharmacy may adjust dosing strength, interval, or rate of medication as needed for optimal therapy for the patient  Send with patient on call to the OR.  Anesthesia to complete antibiotic administration <67min prior to incision per [facility].   2 g 100 mL/hr over 30 Minutes Intravenous 30 min pre-op 09/17/12 0853 09/17/12 0902   09/17/12 0900  vancomycin (VANCOCIN) 1,500 mg in sodium chloride 0.9 % 500 mL IVPB     1,500 mg 250 mL/hr over 120 Minutes Intravenous On call to O.R. 09/17/12 1610 09/17/12 1245

## 2012-09-19 DIAGNOSIS — E119 Type 2 diabetes mellitus without complications: Secondary | ICD-10-CM

## 2012-09-19 DIAGNOSIS — I10 Essential (primary) hypertension: Secondary | ICD-10-CM

## 2012-09-19 LAB — GLUCOSE, CAPILLARY
Glucose-Capillary: 117 mg/dL — ABNORMAL HIGH (ref 70–99)
Glucose-Capillary: 149 mg/dL — ABNORMAL HIGH (ref 70–99)
Glucose-Capillary: 118 mg/dL — ABNORMAL HIGH (ref 70–99)

## 2012-09-19 LAB — CREATININE, SERUM
Creatinine, Ser: 0.99 mg/dL (ref 0.50–1.35)
GFR calc Af Amer: >90 mL/min (ref >90)
GFR calc non Af Amer: >90 mL/min (ref >90)

## 2012-09-19 MED ORDER — KETOROLAC TROMETHAMINE 30 MG/ML IJ SOLN: 30.0000 mg | Freq: Four times a day (QID) | INTRAMUSCULAR | Status: DC

## 2012-09-19 MED ORDER — PSYLLIUM 95 % PO PACK
1.0000 | PACK | Freq: Two times a day (BID) | ORAL | Status: DC
  Administered 2012-09-19 – 2012-09-20 (×3): 1 via ORAL
  Filled 2012-09-19 (×4): qty 1

## 2012-09-19 MED ORDER — SACCHAROMYCES BOULARDII 250 MG PO CAPS
250.0000 mg | ORAL_CAPSULE | Freq: Two times a day (BID) | ORAL | Status: DC
  Administered 2012-09-19 – 2012-09-20 (×3): 250 mg via ORAL
  Filled 2012-09-19 (×4): qty 1

## 2012-09-19 MED ORDER — METOPROLOL TARTRATE 50 MG PO TABS
50.0000 mg | ORAL_TABLET | Freq: Two times a day (BID) | ORAL | Status: DC
  Administered 2012-09-19 – 2012-09-20 (×3): 50 mg via ORAL
  Filled 2012-09-19 (×4): qty 1

## 2012-09-19 MED ORDER — INSULIN DETEMIR 100 UNIT/ML ~~LOC~~ SOLN: 80.0000 [IU] | Freq: Every day | SUBCUTANEOUS | Status: DC

## 2012-09-19 MED ORDER — SODIUM CHLORIDE 0.9 % IJ SOLN: 3.0000 mL | INTRAMUSCULAR | Status: DC | PRN

## 2012-09-19 MED ORDER — SODIUM CHLORIDE 0.9 % IJ SOLN
3.0000 mL | Freq: Two times a day (BID) | INTRAMUSCULAR | Status: DC
  Administered 2012-09-20: 3 mL via INTRAVENOUS

## 2012-09-19 MED ORDER — METOPROLOL TARTRATE 1 MG/ML IV SOLN: 10.0000 mg | Freq: Four times a day (QID) | INTRAVENOUS | Status: DC

## 2012-09-19 MED ORDER — GABAPENTIN 100 MG PO CAPS
200.0000 mg | ORAL_CAPSULE | Freq: Three times a day (TID) | ORAL | Status: DC
  Administered 2012-09-19 – 2012-09-20 (×4): 200 mg via ORAL
  Filled 2012-09-19 (×6): qty 2

## 2012-09-19 MED ORDER — METOPROLOL TARTRATE 1 MG/ML IV SOLN
5.0000 mg | Freq: Four times a day (QID) | INTRAVENOUS | Status: DC | PRN
  Filled 2012-09-19: qty 5

## 2012-09-19 MED ORDER — OXYCODONE HCL 5 MG PO TABS: 5.0000 mg | ORAL_TABLET | ORAL | Status: DC | PRN

## 2012-09-19 MED ORDER — NAPROXEN 500 MG PO TABS
500.0000 mg | ORAL_TABLET | Freq: Two times a day (BID) | ORAL | Status: DC
  Administered 2012-09-19 – 2012-09-20 (×3): 500 mg via ORAL
  Filled 2012-09-19 (×6): qty 1

## 2012-09-19 MED ORDER — INSULIN ASPART 100 UNIT/ML ~~LOC~~ SOLN: 0.0000 [IU] | Freq: Every day | SUBCUTANEOUS | Status: DC

## 2012-09-19 MED ORDER — BUPIVACAINE 0.25 % ON-Q PUMP DUAL CATH 300 ML: 300.0000 mL | INJECTION | Status: DC

## 2012-09-19 MED ORDER — INSULIN DETEMIR 100 UNIT/ML ~~LOC~~ SOLN
40.0000 [IU] | Freq: Two times a day (BID) | SUBCUTANEOUS | Status: DC
  Administered 2012-09-19 – 2012-09-20 (×3): 40 [IU] via SUBCUTANEOUS
  Filled 2012-09-19 (×3): qty 0.4

## 2012-09-19 MED ORDER — INSULIN ASPART 100 UNIT/ML ~~LOC~~ SOLN
0.0000 [IU] | Freq: Three times a day (TID) | SUBCUTANEOUS | Status: DC
  Administered 2012-09-20: 2 [IU] via SUBCUTANEOUS

## 2012-09-19 NOTE — Progress Notes (Signed)
Pt offered shower this a.m and refused at the time.  Pt's wife stated "I've already cleaned him up but when I get back we may do a shower then."  Pt told to let staff know when he is ready for a shower.

## 2012-09-19 NOTE — Progress Notes (Signed)
[name] [id number] 02/24/1966  CARE TEAM:  PCP: Catalina Pizza, MD  Outpatient Care Team: Patient Care Team: Catalina Pizza, MD as PCP - General (Internal Medicine)  Inpatient Treatment Team: Treatment Team: Attending Provider: Ardeth Sportsman, MD; Registered Nurse: Jonathon Bellows, RN; Technician: Vella Raring, NT; Registered Nurse: Romeo Rabon, RN; Registered Nurse: Dina Rich, RN   Subjective:  Much less sore at right shoulder Abdomen mildly tender No nausea/vomiting Walked in hallways x8  Objective:  Vital signs:  Filed Vitals:   09/18/12 1800 09/18/12 2138 09/19/12 0203 09/19/12 0557  BP: 195/94 151/92 136/92 141/86  Pulse: 85 81 75 95  Temp: 98.7 F (37.1 C) 98.3 F (36.8 C) 99.1 F (37.3 C) 98.6 F (37 C)  TempSrc: Oral Oral Oral Oral  Resp: 18 18 20 18   Height:      Weight:      SpO2: 90% 97% 95% 93%    Last BM Date: 09/17/12  Intake/Output   Yesterday:  05/29 0701 - 05/30 0700 In: 3371.3 [P.O.:120; I.V.:3251.3] Out: 845 [Urine:800; Drains:45] This shift:     Bowel function:  Flatus: Yes  BM: n  Drain: Serosanguinous, scant  Physical Exam:  General: Pt awake/alert/oriented x4 in no acute distress Eyes: PERRL, normal EOM.  Sclera clear.  No icterus Neuro: CN II-XII intact w/o focal sensory/motor deficits. Lymph: No head/neck/groin lymphadenopathy Psych:  No delerium/psychosis/paranoia HENT: Normocephalic, Mucus membranes moist.  No thrush Neck: Supple, No tracheal deviation Chest: No chest wall pain w good excursion CV:  Pulses intact.  Regular rhythm MS: Normal AROM mjr joints x right shoulder AROM.  No joint swelling/heat.  No obvious deformity Abdomen: Soft.  Nondistended.  Mildly tender at incisions only.  No evidence of peritonitis.  No incarcerated hernias. Ext:  SCDs BLE.  No mjr edema.  No cyanosis Skin: No petechiae / purpura   Problem List:   Principal Problem:   Incisional hernia,  incarcerated Active Problems:   Morbid obesity   DM type 2 (diabetes mellitus, type 2)   SBO (small bowel obstruction)   Assessment  [name]  47 y.o. male  2 Days Post-Op  Procedure(s): LAPAROSCOPIC VENTRAL HERNIA LAPAROSCOPIC and Open  LYSIS OF ADHESIONS  Recovering s/p LOA & reduction/repair of Richter's type VWH through mesh  Plan:  -adv to fulls & further as tolertaed -add NSAID.  Inc dilaudid.  Switch to PO more. Continue OnQ, ice, heat -gout Tx -if shoulder not better by tomorrow - Xrays -DM control - increase Levimir & change to 40 BID.  (60 day to 80 total a day).  Novalog back on.  HgbA1c 7.9 better than 12 but still too high -VTE prophylaxis- SCDs, lovenox SQ -mobilize as tolerated to help recovery  Ardeth Sportsman, M.D., F.A.C.S. Gastrointestinal and Minimally Invasive Surgery [facility] [address] [place], [place] [address] [phone] Main / Paging   09/19/2012   Results:   Labs: Results for orders placed during the hospital encounter of 09/17/12 (from the past 48 hour(s))  GLUCOSE, CAPILLARY     Status: Abnormal   Collection Time    09/17/12  8:29 AM      Result Value Range   Glucose-Capillary 188 (*) 70 - 99 mg/dL  MRSA PCR SCREENING     Status: None   Collection Time    09/17/12  9:09 AM      Result Value Range   MRSA by PCR NEGATIVE  NEGATIVE  Comment:            The GeneXpert MRSA Assay (FDA     approved for NASAL specimens     only), is one component of a     comprehensive MRSA colonization     surveillance program. It is not     intended to diagnose MRSA     infection nor to guide or     monitor treatment for     MRSA infections.  GLUCOSE, CAPILLARY     Status: Abnormal   Collection Time    09/17/12 11:07 AM      Result Value Range   Glucose-Capillary 175 (*) 70 - 99 mg/dL   Comment 1 Notify RN    GLUCOSE, CAPILLARY     Status: Abnormal   Collection Time    09/17/12  7:50 PM       Result Value Range   Glucose-Capillary 220 (*) 70 - 99 mg/dL  GLUCOSE, CAPILLARY     Status: Abnormal   Collection Time    09/17/12 11:51 PM      Result Value Range   Glucose-Capillary 195 (*) 70 - 99 mg/dL  GLUCOSE, CAPILLARY     Status: Abnormal   Collection Time    09/18/12  4:14 AM      Result Value Range   Glucose-Capillary 138 (*) 70 - 99 mg/dL  CBC     Status: Abnormal   Collection Time    09/18/12  5:15 AM      Result Value Range   WBC 6.8  4.0 - 10.5 K/uL   RBC 3.98 (*) 4.22 - 5.81 MIL/uL   Hemoglobin 12.9 (*) 13.0 - 17.0 g/dL   HCT 16.1  09.6 - 04.5 %   MCV 98.7  78.0 - 100.0 fL   MCH 32.4  26.0 - 34.0 pg   MCHC 32.8  30.0 - 36.0 g/dL   RDW 40.9  81.1 - 91.4 %   Platelets 134 (*) 150 - 400 K/uL  COMPREHENSIVE METABOLIC PANEL     Status: Abnormal   Collection Time    09/18/12  5:15 AM      Result Value Range   Sodium 133 (*) 135 - 145 mEq/L   Potassium 4.1  3.5 - 5.1 mEq/L   Chloride 97  96 - 112 mEq/L   CO2 30  19 - 32 mEq/L   Glucose, Bld 146 (*) 70 - 99 mg/dL   BUN 7  6 - 23 mg/dL   Creatinine, Ser 7.82  0.50 - 1.35 mg/dL   Calcium 8.4  8.4 - 95.6 mg/dL   Total Protein 6.2  6.0 - 8.3 g/dL   Albumin 2.9 (*) 3.5 - 5.2 g/dL   AST 73 (*) 0 - 37 U/L   ALT 53  0 - 53 U/L   Alkaline Phosphatase 50  39 - 117 U/L   Total Bilirubin 0.7  0.3 - 1.2 mg/dL   GFR calc non Af Amer >90  >90 mL/min   GFR calc Af Amer >90  >90 mL/min   Comment:            The eGFR has been calculated     using the CKD EPI equation.     This calculation has not been     validated in all clinical     situations.     eGFR's persistently     <90 mL/min signify     possible Chronic Kidney Disease.  HEMOGLOBIN A1C  Status: Abnormal   Collection Time    09/18/12  5:15 AM      Result Value Range   Hemoglobin A1C 7.8 (*) <5.7 %   Comment: (NOTE)                                                                               According to the ADA Clinical Practice Recommendations for 2011,  when     HbA1c is used as a screening test:      >=6.5%   Diagnostic of Diabetes Mellitus               (if abnormal result is confirmed)     5.7-6.4%   Increased risk of developing Diabetes Mellitus     References:Diagnosis and Classification of Diabetes Mellitus,Diabetes     Care,2011,34(Suppl 1):S62-S69 and Standards of Medical Care in             Diabetes - 2011,Diabetes Care,2011,34 (Suppl 1):S11-S61.   Mean Plasma Glucose 177 (*) <117 mg/dL  GLUCOSE, CAPILLARY     Status: Abnormal   Collection Time    09/18/12  7:52 AM      Result Value Range   Glucose-Capillary 149 (*) 70 - 99 mg/dL  GLUCOSE, CAPILLARY     Status: Abnormal   Collection Time    09/18/12 12:13 PM      Result Value Range   Glucose-Capillary 172 (*) 70 - 99 mg/dL  GLUCOSE, CAPILLARY     Status: Abnormal   Collection Time    09/18/12  5:01 PM      Result Value Range   Glucose-Capillary 164 (*) 70 - 99 mg/dL  GLUCOSE, CAPILLARY     Status: Abnormal   Collection Time    09/18/12  7:58 PM      Result Value Range   Glucose-Capillary 148 (*) 70 - 99 mg/dL  GLUCOSE, CAPILLARY     Status: Abnormal   Collection Time    09/18/12 11:43 PM      Result Value Range   Glucose-Capillary 108 (*) 70 - 99 mg/dL  GLUCOSE, CAPILLARY     Status: Abnormal   Collection Time    09/19/12  3:56 AM      Result Value Range   Glucose-Capillary 129 (*) 70 - 99 mg/dL  POTASSIUM     Status: None   Collection Time    09/19/12  4:20 AM      Result Value Range   Potassium 4.5  3.5 - 5.1 mEq/L  CREATININE, SERUM     Status: None   Collection Time    09/19/12  4:20 AM      Result Value Range   Creatinine, Ser 0.99  0.50 - 1.35 mg/dL   GFR calc non Af Amer >90  >90 mL/min   GFR calc Af Amer >90  >90 mL/min   Comment:            The eGFR has been calculated     using the CKD EPI equation.     This calculation has not been     validated in all clinical     situations.     eGFR's persistently     <  90 mL/min signify     possible  Chronic Kidney Disease.  HEMOGLOBIN     Status: Abnormal   Collection Time    09/19/12  4:20 AM      Result Value Range   Hemoglobin 12.5 (*) 13.0 - 17.0 g/dL    Imaging / Studies: No results found.  Medications / Allergies: per chart  Antibiotics: Anti-infectives   Start     Dose/Rate Route Frequency Ordered Stop   09/17/12 2200  ceFAZolin (ANCEF) IVPB 2 g/50 mL premix     2 g 100 mL/hr over 30 Minutes Intravenous 3 times per day 09/17/12 2104 09/18/12 0623   09/17/12 2115  clindamycin (CLEOCIN) 900 mg, gentamicin (GARAMYCIN) 240 mg in sodium chloride 0.9 % 1,000 mL for intraperitoneal lavage  Status:  Discontinued     1 application Intraperitoneal To Surgery 09/17/12 2104 09/17/12 2110   09/17/12 1730  clindamycin (CLEOCIN) 900 mg, gentamicin (GARAMYCIN) 240 mg in sodium chloride 0.9 % 1,000 mL for intraperitoneal lavage      Intraperitoneal To Surgery 09/17/12 1726 09/17/12 1839   09/17/12 0902  ceFAZolin (ANCEF) 3 g in dextrose 5 % 50 mL IVPB    Comments:  Pharmacy may adjust dosing strength, interval, or rate of medication as needed for optimal therapy for the patient  Send with patient on call to the OR.  Anesthesia to complete antibiotic administration <35min prior to incision per [facility].   3 g 160 mL/hr over 30 Minutes Intravenous 30 min pre-op 09/17/12 0902 09/17/12 1350   09/17/12 0901  ceFAZolin (ANCEF) IVPB 2 g/50 mL premix  Status:  Discontinued    Comments:  Pharmacy may adjust dosing strength, interval, or rate of medication as needed for optimal therapy for the patient  Send with patient on call to the OR.  Anesthesia to complete antibiotic administration <83min prior to incision per [facility].   2 g 100 mL/hr over 30 Minutes Intravenous 30 min pre-op 09/17/12 0853 09/17/12 0902   09/17/12 0900  vancomycin (VANCOCIN) 1,500 mg in sodium chloride 0.9 % 500 mL IVPB     1,500 mg 250 mL/hr over 120 Minutes Intravenous On call to O.R. 09/17/12 4098 09/17/12  1245

## 2012-09-20 LAB — GLUCOSE, CAPILLARY: Glucose-Capillary: 169 mg/dL — ABNORMAL HIGH (ref 70–99)

## 2012-09-20 MED ORDER — OXYCODONE HCL 5 MG PO TABS: 5.0000 mg | ORAL_TABLET | ORAL | Status: DC | PRN

## 2012-09-20 NOTE — Progress Notes (Signed)
Pt and wife verbalized understanding of dc instructions. Dry dressing applied to midline incision per MD order. Scripts x 1 given as provided by MD. Introduced to My Chart, pt expressed "familiar with that", verbalized understanding through teach back. Bulb drain intact and patent with instructions given on how to care for and empty. Drain flow sheet and instruction sheet provided. Pt and wife has taken care of bulb drain at home before and familiar how to maintain and care for. Discussed medications to resume at home as well as PRN analgesics with verbalized understanding. Awaiting portables to deliver abd binder and preparing to dc home.

## 2012-09-20 NOTE — Progress Notes (Signed)
3 Days Post-Op  Subjective: Asking to go home.  Tolerating diet and ambulating frequently. No pain. +flatus, no BM  Objective: Vital signs in last 24 hours: Temp:  [98.4 F (36.9 C)-98.8 F (37.1 C)] 98.4 F (36.9 C) (05/30 2200) Pulse Rate:  [80-85] 85 (05/30 2200) Resp:  [18] 18 (05/30 2200) BP: (138-155)/(72-92) 155/72 mmHg (05/30 2200) SpO2:  [95 %] 95 % (05/30 2200) Last BM Date: 09/17/12  Intake/Output from previous day: 05/30 0701 - 05/31 0700 In: 460 [P.O.:360; I.V.:100] Out: 2515 [Urine:2500; Drains:15] Intake/Output this shift:    General appearance: alert, cooperative and no distress Resp: nonlabored Cardio: normal rate, regular GI: soft, minimal abdominal tenderness, ND, wounds okay, on Q pump removed, JP serous Extremities: SCD's bilat  Lab Results:   Recent Labs  09/18/12 0515 09/19/12 0420  WBC 6.8  --   HGB 12.9* 12.5*  HCT 39.3  --   PLT 134*  --    BMET  Recent Labs  09/18/12 0515 09/19/12 0420  NA 133*  --   K 4.1 4.5  CL 97  --   CO2 30  --   GLUCOSE 146*  --   BUN 7  --   CREATININE 0.88 0.99  CALCIUM 8.4  --    PT/INR No results found for this basename: LABPROT, INR,  in the last 72 hours ABG No results found for this basename: PHART, PCO2, PO2, HCO3,  in the last 72 hours  Studies/Results: No results found.  Anti-infectives: Anti-infectives   Start     Dose/Rate Route Frequency Ordered Stop   09/17/12 2200  ceFAZolin (ANCEF) IVPB 2 g/50 mL premix     2 g 100 mL/hr over 30 Minutes Intravenous 3 times per day 09/17/12 2104 09/18/12 0623   09/17/12 2115  clindamycin (CLEOCIN) 900 mg, gentamicin (GARAMYCIN) 240 mg in sodium chloride 0.9 % 1,000 mL for intraperitoneal lavage  Status:  Discontinued     1 application Intraperitoneal To Surgery 09/17/12 2104 09/17/12 2110   09/17/12 1730  clindamycin (CLEOCIN) 900 mg, gentamicin (GARAMYCIN) 240 mg in sodium chloride 0.9 % 1,000 mL for intraperitoneal lavage      Intraperitoneal To  Surgery 09/17/12 1726 09/17/12 1839   09/17/12 0902  ceFAZolin (ANCEF) 3 g in dextrose 5 % 50 mL IVPB    Comments:  Pharmacy may adjust dosing strength, interval, or rate of medication as needed for optimal therapy for the patient  Send with patient on call to the OR.  Anesthesia to complete antibiotic administration <26min prior to incision per [facility].   3 g 160 mL/hr over 30 Minutes Intravenous 30 min pre-op 09/17/12 0902 09/17/12 1350   09/17/12 0901  ceFAZolin (ANCEF) IVPB 2 g/50 mL premix  Status:  Discontinued    Comments:  Pharmacy may adjust dosing strength, interval, or rate of medication as needed for optimal therapy for the patient  Send with patient on call to the OR.  Anesthesia to complete antibiotic administration <69min prior to incision per [facility].   2 g 100 mL/hr over 30 Minutes Intravenous 30 min pre-op 09/17/12 0853 09/17/12 0902   09/17/12 0900  vancomycin (VANCOCIN) 1,500 mg in sodium chloride 0.9 % 500 mL IVPB     1,500 mg 250 mL/hr over 120 Minutes Intravenous On call to O.R. 09/17/12 0853 09/17/12 1245      Assessment/Plan: s/p Procedure(s): LAPAROSCOPIC VENTRAL HERNIA (N/A) LAPAROSCOPIC and Open  LYSIS OF ADHESIONS (N/A) He looks very good and minimal pain.  tolerating diet.  I think that he should be okay for discharge to home.  LOS: 3 days    [name] 09/20/2012

## 2012-09-20 NOTE — Progress Notes (Signed)
Assessment unchanged. Pt discharged via wheelchair to front entrance to meet awaiting vehicle to carry home. Accompanied by wife and NT.

## 2012-09-22 ENCOUNTER — Telehealth (INDEPENDENT_AMBULATORY_CARE_PROVIDER_SITE_OTHER): Payer: Self-pay | Admitting: General Surgery

## 2012-09-22 NOTE — Discharge Summary (Signed)
Physician Discharge Summary  Patient ID: BRAYTON BAUMGARTNER MRN: [id number] DOB/AGE: 07/26/1965 47 y.o.  Admit date: 09/17/2012 Discharge date: 09/20/2012  Admission Diagnoses:  Discharge Diagnoses:  Principal Problem:   Incisional hernia, incarcerated Active Problems:   Morbid obesity   DM type 2 (diabetes mellitus, type 2)   SBO (small bowel obstruction)   Discharged Condition: good  Hospital Course: Postoperatively, the patient mobilized.  He had some moderate right shoulder pain but that faded away and 24 hours after surgery.  He began to have flatus and advanced to a solid diet gradually.  Pain was well-controlled and transitioned off IV medications.    Glucoses were elevated.  Hemoglobin A1c 7.9.  Levimir increased to 40 twice a day, increase of 80 units a day over 60 minutes a day.  That seemed to control things better.  Diabetes teaching done in hospital  By the time of discharge, the patient was walking well the hallways, eating food well, having flatus.  Pain was-controlled on an oral regimen.  Based on meeting DC criteria and recovering well, I felt it was safe for the patient to be discharged home with close followup.  Instructions were discussed in detail.  They are written as well.     Consults: diabetic coordinator  Significant Diagnostic Studies: labs:  Results for orders placed during the hospital encounter of 09/17/12 (from the past 72 hour(s))  GLUCOSE, CAPILLARY     Status: Abnormal   Collection Time    09/19/12  4:25 PM      Result Value Range   Glucose-Capillary 149 (*) 70 - 99 mg/dL  GLUCOSE, CAPILLARY     Status: Abnormal   Collection Time    09/19/12  8:00 PM      Result Value Range   Glucose-Capillary 118 (*) 70 - 99 mg/dL  GLUCOSE, CAPILLARY     Status: Abnormal   Collection Time    09/20/12 12:30 AM      Result Value Range   Glucose-Capillary 169 (*) 70 - 99 mg/dL  GLUCOSE, CAPILLARY     Status: Abnormal   Collection Time    09/20/12  5:51 AM       Result Value Range   Glucose-Capillary 100 (*) 70 - 99 mg/dL  GLUCOSE, CAPILLARY     Status: Abnormal   Collection Time    09/20/12  7:54 AM      Result Value Range   Glucose-Capillary 137 (*) 70 - 99 mg/dL    Treatments: surgery: Laparoscopic lysis adhesions, open lysis of adhesiond, laparoscopically assisted Reduction and repair of incarcerated recurrent ventral wall Incisional hernia  Discharge Exam: Blood pressure 140/82, pulse 80, temperature 98.4 F (36.9 C), temperature source Oral, resp. rate 18, height 5\' 11"  (1.803 m), weight 300 lb (136.079 kg), SpO2 95.00%.  General: Pt awake/alert/oriented x4 in no major acute distress Eyes: PERRL, normal EOM. Sclera nonicteric Neuro: CN II-XII intact w/o focal sensory/motor deficits. Lymph: No head/neck/groin lymphadenopathy Psych:  No delerium/psychosis/paranoia HENT: Normocephalic, Mucus membranes moist.  No thrush Neck: Supple, No tracheal deviation Chest: No pain.  Good respiratory excursion. CV:  Pulses intact.  Regular rhythm MS: Normal AROM mjr joints.  No obvious deformity Abdomen: Soft, Nondistended.  Appropriately tender at incisions.  Drain with scant serosanguineous fluid.  No incarcerated hernias. Ext:  SCDs BLE.  No significant edema.  No cyanosis Skin: No petechiae / purpura   Disposition: 01-Home or Self Care  Discharge Orders   Future Appointments Provider Department Dept Phone  09/30/2012 1:30 PM Ardeth Sportsman, MD [facility], [place] [phone]   Future Orders Complete By Expires     Call MD for:  difficulty breathing, headache or visual disturbances  As directed     Call MD for:  extreme fatigue  As directed     Call MD for:  hives  As directed     Call MD for:  hives  As directed     Call MD for:  persistant dizziness or light-headedness  As directed     Call MD for:  persistant nausea and vomiting  As directed     Call MD for:  persistant nausea and vomiting  As directed     Call MD for:   redness, tenderness, or signs of infection (pain, swelling, redness, odor or green/yellow discharge around incision site)  As directed     Call MD for:  redness, tenderness, or signs of infection (pain, swelling, redness, odor or green/yellow discharge around incision site)  As directed     Call MD for:  severe uncontrolled pain  As directed     Call MD for:  severe uncontrolled pain  As directed     Call MD for:  temperature >100.4  As directed     Call MD for:  As directed     Comments:      Temperature > 101.42F    Diet - low sodium heart healthy  As directed     Diet - low sodium heart healthy  As directed     Discharge instructions  As directed     Comments:      Please see discharge instruction sheets.  Also refer to handout given an office.  Please call our office if you have any questions or concerns [phone]    Discharge instructions  As directed     Comments:      Strip and record drain output every 4 hours and as needed Record daily drain output Call [phone] on Monday to schedule follow up appointment and drain removal with Dr. Michaell Cowing No lifting more than 10 lbs for 4 weeks.    Discharge wound care:  As directed     Comments:      If you have closed incisions, shower and bathe over these incisions with soap and water every day.  Remove all surgical dressings on postoperative day #3.  You do not need to replace dressings over the closed incisions unless you feel more comfortable with a Band-Aid covering it.   If you have an open wound that requires packing, please see wound care instructions.  In general, remove all dressings, wash wound with soap and water and then replace with saline moistened gauze.  Do the dressing change at least every day.  Please call our office [phone] if you have further questions.    Driving Restrictions  As directed     Comments:      No driving until off narcotics and can safely swerve away without pain during an emergency    Increase  activity slowly  As directed     Comments:      Walk an hour a day.  Use 20-30 minute walks.  When you can walk 30 minutes without difficulty, increase to low impact/moderate activities such as biking, jogging, swimming, sexual activity..  Eventually can increase to unrestricted activity when not feeling pain.  If you feel pain: STOP!Marland Kitchen   Let pain protect you from overdoing it.  Use ice/heat/over-the-counter  pain medications to help minimize his soreness.  Use pain prescriptions as needed to remain active.  It is better to take extra pain medications and be more active than to stay bedridden to avoid all pain medications.    Increase activity slowly  As directed     Lifting restrictions  As directed     Comments:      Avoid heavy lifting initially.  Do not push through pain.  You have no specific weight limit.  Coughing and sneezing or four more stressful to your incision than any lifting you will do. Pain will protect you from injury.  Therefore, avoid intense activity until off all narcotic pain medications.  Coughing and sneezing or four more stressful to your incision than any lifting he will do.    May shower / Bathe  As directed     May walk up steps  As directed     Sexual Activity Restrictions  As directed     Comments:      Sexual activity as tolerated.  Do not push through pain.  Pain will protect you from injury.    Walk with assistance  As directed     Comments:      Walk over an hour a day.  May use a walker/cane/companion to help with balance and stamina.        Medication List    TAKE these medications       allopurinol 300 MG tablet  Commonly known as:  ZYLOPRIM  Take 300 mg by mouth daily.     atenolol 100 MG tablet  Commonly known as:  TENORMIN  Take 100 mg by mouth daily.     CINNAMON PO  Take by mouth.     colchicine 0.6 MG tablet  Take 0.6 mg by mouth every Tuesday, Thursday, Saturday, and Sunday.     gabapentin 100 MG capsule  Commonly known as:  NEURONTIN   Take 1-2 capsules (100-200 mg total) by mouth at bedtime as needed. For burning sensation in feet.     insulin aspart 100 UNIT/ML injection  Commonly known as:  novoLOG  Inject 7 Units into the skin every evening.     insulin detemir 100 UNIT/ML injection  Commonly known as:  LEVEMIR  Inject 0.8 mLs (80 Units total) into the skin at bedtime.     insulin detemir 100 UNIT/ML injection  Commonly known as:  LEVEMIR  Inject 60 Units into the skin daily.     MAGNESIUM-ZINC PO  Take 1 tablet by mouth daily.     methocarbamol 500 MG tablet  Commonly known as:  ROBAXIN  Take 1 tablet (500 mg total) by mouth 4 (four) times daily.     multivitamin with minerals Tabs  Take 1 tablet by mouth daily.     omeprazole 40 MG capsule  Commonly known as:  PRILOSEC  Take 40 mg by mouth every Tuesday, Thursday, Saturday, and Sunday.     oxyCODONE 5 MG immediate release tablet  Commonly known as:  Oxy IR/ROXICODONE  Take 1 tablet (5 mg total) by mouth every 4 (four) hours as needed for pain.           Follow-up Information   Follow up with , C., MD. Schedule an appointment as soon as possible for a visit in 10 days.   Contact information:   [address] [place] [address] [phone]       Signed: Ardeth Sportsman. 09/22/2012, 1:29 PM

## 2012-09-22 NOTE — Telephone Encounter (Signed)
Called patient to check on him post surgery. He states he is doing well. His only complaint is ongoing shoulder pain. He states his pain medication is not touching the pain in his shoulder. He was given a type of anti-inflammatory in the hospital that he said made the shoulder pain go away. He is asking if this can be given as an outpatient. I advised I would check with Dr Michaell Cowing. He does have an appt already scheduled for post op next week 09/30/2012 and he will keep his appt.

## 2012-09-24 MED ORDER — NAPROXEN 500 MG PO TABS: 500.0000 mg | ORAL_TABLET | Freq: Two times a day (BID) | ORAL | Status: DC

## 2012-09-24 NOTE — Telephone Encounter (Signed)
Per Dr Michaell Cowing - okay to call in a short course of naproxen for patient. This has been sent to the [facility] in [place] per patient's request. He states the shoulder pain has gotten some better, but it comes and goes. I advised him to not take naproxen for a long amount of time. He will follow up at his scheduled appt.

## 2012-09-30 ENCOUNTER — Ambulatory Visit (INDEPENDENT_AMBULATORY_CARE_PROVIDER_SITE_OTHER): Payer: BC Managed Care – PPO | Admitting: Surgery

## 2012-09-30 VITALS — BP 150/98 | HR 70 | Temp 97.2°F | Resp 16 | Ht 71.0 in | Wt 303.2 lb

## 2012-09-30 DIAGNOSIS — K430 Incisional hernia with obstruction, without gangrene: Secondary | ICD-10-CM

## 2012-09-30 NOTE — Patient Instructions (Signed)
HERNIA REPAIR: POST OP INSTRUCTIONS  1. DIET: Follow a light bland diet the first 24 hours after arrival home, such as soup, liquids, crackers, etc.  Be sure to include lots of fluids daily.  Avoid fast food or heavy meals as your are more likely to get nauseated.  Eat a low fat the next few days after surgery. 2. Take your usually prescribed home medications unless otherwise directed. 3. PAIN CONTROL: a. Pain is best controlled by a usual combination of three different methods TOGETHER: i. Ice/Heat ii. Over the counter pain medication iii. Prescription pain medication b. Most patients will experience some swelling and bruising around the hernia(s) such as the bellybutton, groins, or old incisions.  Ice packs or heating pads (30-60 minutes up to 6 times a day) will help. Use ice for the first few days to help decrease swelling and bruising, then switch to heat to help relax tight/sore spots and speed recovery.  Some people prefer to use ice alone, heat alone, alternating between ice & heat.  Experiment to what works for you.  Swelling and bruising can take several weeks to resolve.   c. It is helpful to take an over-the-counter pain medication regularly for the first few weeks.  Choose one of the following that works best for you: i. Naproxen (Aleve, etc)  Two 220mg tabs twice a day ii. Ibuprofen (Advil, etc) Three 200mg tabs four times a day (every meal & bedtime) iii. Acetaminophen (Tylenol, etc) 325-650mg four times a day (every meal & bedtime) d. A  prescription for pain medication should be given to you upon discharge.  Take your pain medication as prescribed.  i. If you are having problems/concerns with the prescription medicine (does not control pain, nausea, vomiting, rash, itching, etc), please call us [phone] to see if we need to switch you to a different pain medicine that will work better for you and/or control your side effect better. ii. If you need a refill on your pain  medication, please contact your pharmacy.  They will contact our office to request authorization. Prescriptions will not be filled after 5 pm or on week-ends. 4. Avoid getting constipated.  Between the surgery and the pain medications, it is common to experience some constipation.  Increasing fluid intake and taking a fiber supplement (such as Metamucil, Citrucel, FiberCon, MiraLax, etc) 1-2 times a day regularly will usually help prevent this problem from occurring.  A mild laxative (prune juice, Milk of Magnesia, MiraLax, etc) should be taken according to package directions if there are no bowel movements after 48 hours.   5. Wash / shower every day.  You may shower over the dressings as they are waterproof.   6. Remove your waterproof bandages 5 days after surgery.  You may leave the incision open to air.  You may replace a dressing/Band-Aid to cover the incision for comfort if you wish.  Continue to shower over incision(s) after the dressing is off.    7. ACTIVITIES as tolerated:   a. You may resume regular (light) daily activities beginning the next day-such as daily self-care, walking, climbing stairs-gradually increasing activities as tolerated.  If you can walk 30 minutes without difficulty, it is safe to try more intense activity such as jogging, treadmill, bicycling, low-impact aerobics, swimming, etc. b. Save the most intensive and strenuous activity for last such as sit-ups, heavy lifting, contact sports, etc  Refrain from any heavy lifting or straining until you are off narcotics for pain control.     c. DO NOT PUSH THROUGH PAIN.  Let pain be your guide: If it hurts to do something, don't do it.  Pain is your body warning you to avoid that activity for another week until the pain goes down. d. You may drive when you are no longer taking prescription pain medication, you can comfortably wear a seatbelt, and you can safely maneuver your car and apply brakes. e. Dennis Bast may have sexual intercourse  when it is comfortable.  8. FOLLOW UP in our office a. Please call [facility] at [phone] to set up an appointment to see your surgeon in the office for a follow-up appointment approximately 2-3 weeks after your surgery. b. Make sure that you call for this appointment the day you arrive home to insure a convenient appointment time. 9.  IF YOU HAVE DISABILITY OR FAMILY LEAVE FORMS, BRING THEM TO THE OFFICE FOR PROCESSING.  DO NOT GIVE THEM TO YOUR DOCTOR.  WHEN TO CALL us [phone]: 1. Poor pain control 2. Reactions / problems with new medications (rash/itching, nausea, etc)  3. Fever over 101.5 F (38.5 C) 4. Inability to urinate 5. Nausea and/or vomiting 6. Worsening swelling or bruising 7. Continued bleeding from incision. 8. Increased pain, redness, or drainage from the incision   The clinic staff is available to answer your questions during regular business hours (8:30am-5pm).  Please don't hesitate to call and ask to speak to one of our nurses for clinical concerns.   If you have a medical emergency, go to the nearest emergency room or call 911.  A surgeon from [facility] is always on call at the hospitals in [facility], [place], [place], [place], [place]  [address] ?  [address] [address], [place],    [address] MAIN: [id number] ? TOLL FREE: [phone] ? FAX: [phone] [email or web address]  Managing Pain  Pain after surgery or related to activity is often due to strain/injury to muscle, tendon, nerves and/or incisions.  This pain is usually short-term and will improve in a few months.   Many people find it helpful to do the following things TOGETHER to help speed the process of healing and to get back to regular activity more quickly:  1. Avoid heavy physical activity a.  no lifting greater than 20 pounds b. Do not "push through" the pain.  Listen to your body and avoid positions and maneuvers than  reproduce the pain c. Walking is okay as tolerated, but go slowly and stop when getting sore.  d. Remember: If it hurts to do it, then don't do it! 2. Take Anti-inflammatory medication  a. Take with food/snack around the clock for 1-2 weeks i. This helps the muscle and nerve tissues become less irritable and calm down faster b. Choose ONE of the following over-the-counter medications: i. Naproxen 257m tabs (ex. Aleve) 1-2 pills twice a day  ii. Ibuprofen 2044mtabs (ex. Advil, Motrin) 3-4 pills with every meal and just before bedtime iii. Acetaminophen 50075mabs (Tylenol) 1-2 pills with every meal and just before bedtime 3. Use a Heating pad or Ice/Cold Pack a. 4-6 times a day b. May use warm bath/hottub  or showers 4. Try Gentle Massage and/or Stretching  a. at the area of pain many times a day b. stop if you feel pain - do not overdo it  Try these steps together to help you body heal faster and avoid making things get worse.  Doing just one of these  things may not be enough.    If you are not getting better after two weeks or are noticing you are getting worse, contact our office for further advice; we may need to re-evaluate you & see what other things we can do to help.  Shoulder Pain The shoulder is the joint that connects your arms to your body. The bones that form the shoulder joint include the upper arm bone (humerus), the shoulder blade (scapula), and the collarbone (clavicle). The top of the humerus is shaped like a ball and fits into a rather flat socket on the scapula (glenoid cavity). A combination of muscles and strong, fibrous tissues that connect muscles to bones (tendons) support your shoulder joint and hold the ball in the socket. Small, fluid-filled sacs (bursae) are located in different areas of the joint. They act as cushions between the bones and the overlying soft tissues and help reduce friction between the gliding tendons and the bone as you move your arm. Your  shoulder joint allows a wide range of motion in your arm. This range of motion allows you to do things like scratch your back or throw a ball. However, this range of motion also makes your shoulder more prone to pain from overuse and injury. Causes of shoulder pain can originate from both injury and overuse and usually can be grouped in the following four categories:  Redness, swelling, and pain (inflammation) of the tendon (tendinitis) or the bursae (bursitis).  Instability, such as a dislocation of the joint.  Inflammation of the joint (arthritis).  Broken bone (fracture). HOME CARE INSTRUCTIONS   Apply ice to the sore area.  Put ice in a plastic bag.  Place a towel between your skin and the bag.  Leave the ice on for 15-20 minutes, 3-4 times per day for the first 2 days.  Stop using cold packs if they do not help with the pain.  If you have a shoulder sling or immobilizer, wear it as long as your caregiver instructs. Only remove it to shower or bathe. Move your arm as little as possible, but keep your hand moving to prevent swelling.  Squeeze a soft ball or foam pad as much as possible to help prevent swelling.  Only take over-the-counter or prescription medicines for pain, discomfort, or fever as directed by your caregiver. SEEK MEDICAL CARE IF:   Your shoulder pain increases, or new pain develops in your arm, hand, or fingers.  Your hand or fingers become cold and numb.  Your pain is not relieved with medicines. SEEK IMMEDIATE MEDICAL CARE IF:   Your arm, hand, or fingers are numb or tingling.  Your arm, hand, or fingers are significantly swollen or turn white or blue. MAKE SURE YOU:   Understand these instructions.  Will watch your condition.  Will get help right away if you are not doing well or get worse. Document Released: 01/17/2005 Document Revised: 01/02/2012 Document Reviewed: 03/24/2011 [facility] Patient Information 2014 [place], [place].   Shoulder  Exercises EXERCISES  RANGE OF MOTION (ROM) AND STRETCHING EXERCISES These exercises may help you when beginning to rehabilitate your injury. Your symptoms may resolve with or without further involvement from your physician, physical therapist or athletic trainer. While completing these exercises, remember:   Restoring tissue flexibility helps normal motion to return to the joints. This allows healthier, less painful movement and activity.  An effective stretch should be held for at least 30 seconds.  A stretch should never be painful. You should only  feel a gentle lengthening or release in the stretched tissue. ROM - Pendulum  Bend at the waist so that your right / left arm falls away from your body. Support yourself with your opposite hand on a solid surface, such as a table or a countertop.  Your right / left arm should be perpendicular to the ground. If it is not perpendicular, you need to lean over farther. Relax the muscles in your right / left arm and shoulder as much as possible.  Gently sway your hips and trunk so they move your right / left arm without any use of your right / left shoulder muscles.  Progress your movements so that your right / left arm moves side to side, then forward and backward, and finally, both clockwise and counterclockwise.  Complete __________ repetitions in each direction. Many people use this exercise to relieve discomfort in their shoulder as well as to gain range of motion. Repeat __________ times. Complete this exercise __________ times per day. STRETCH  Flexion, Standing  Stand with good posture. With an underhand grip on your right / left hand and an overhand grip on the opposite hand, grasp a broomstick or cane so that your hands are a little more than shoulder-width apart.  Keeping your right / left elbow straight and shoulder muscles relaxed, push the stick with your opposite hand to raise your right / left arm in front of your body and then  overhead. Raise your arm until you feel a stretch in your right / left shoulder, but before you have increased shoulder pain.  Try to avoid shrugging your right / left shoulder as your arm rises by keeping your shoulder blade tucked down and toward your mid-back spine. Hold __________ seconds.  Slowly return to the starting position. Repeat __________ times. Complete this exercise __________ times per day. STRETCH - Internal Rotation  Place your right / left hand behind your back, palm-up.  Throw a towel or belt over your opposite shoulder. Grasp the towel/belt with your right / left hand.  While keeping an upright posture, gently pull up on the towel/belt until you feel a stretch in the front of your right / left shoulder.  Avoid shrugging your right / left shoulder as your arm rises by keeping your shoulder blade tucked down and toward your mid-back spine.  Hold __________. Release the stretch by lowering your opposite hand. Repeat __________ times. Complete this exercise __________ times per day. STRETCH - External Rotation and Abduction  Stagger your stance through a doorframe. It does not matter which foot is forward.  As instructed by your physician, physical therapist or athletic trainer, place your hands:  And forearms above your head and on the door frame.  And forearms at head-height and on the door frame.  At elbow-height and on the door frame.  Keeping your head and chest upright and your stomach muscles tight to prevent over-extending your low-back, slowly shift your weight onto your front foot until you feel a stretch across your chest and/or in the front of your shoulders.  Hold __________ seconds. Shift your weight to your back foot to release the stretch. Repeat __________ times. Complete this stretch __________ times per day.  STRENGTHENING EXERCISES  These exercises may help you when beginning to rehabilitate your injury. They may resolve your symptoms with or  without further involvement from your physician, physical therapist or athletic trainer. While completing these exercises, remember:   Muscles can gain both the endurance and the strength  needed for everyday activities through controlled exercises.  Complete these exercises as instructed by your physician, physical therapist or athletic trainer. Progress the resistance and repetitions only as guided.  You may experience muscle soreness or fatigue, but the pain or discomfort you are trying to eliminate should never worsen during these exercises. If this pain does worsen, stop and make certain you are following the directions exactly. If the pain is still present after adjustments, discontinue the exercise until you can discuss the trouble with your clinician.  If advised by your physician, during your recovery, avoid activity or exercises which involve actions that place your right / left hand or elbow above your head or behind your back or head. These positions stress the tissues which are trying to heal. STRENGTH - Scapular Depression and Adduction  With good posture, sit on a firm chair. Supported your arms in front of you with pillows, arm rests or a table top. Have your elbows in line with the sides of your body.  Gently draw your shoulder blades down and toward your mid-back spine. Gradually increase the tension without tensing the muscles along the top of your shoulders and the back of your neck.  Hold for __________ seconds. Slowly release the tension and relax your muscles completely before completing the next repetition.  After you have practiced this exercise, remove the arm support and complete it in standing as well as sitting. Repeat __________ times. Complete this exercise __________ times per day.  STRENGTH - External Rotators  Secure a rubber exercise band/tubing to a fixed object so that it is at the same height as your right / left elbow when you are standing or sitting on a  firm surface.  Stand or sit so that the secured exercise band/tubing is at your side that is not injured.  Bend your elbow 90 degrees. Place a folded towel or small pillow under your right / left arm so that your elbow is a few inches away from your side.  Keeping the tension on the exercise band/tubing, pull it away from your body, as if pivoting on your elbow. Be sure to keep your body steady so that the movement is only coming from your shoulder rotating.  Hold __________ seconds. Release the tension in a controlled manner as you return to the starting position. Repeat __________ times. Complete this exercise __________ times per day.  STRENGTH - Supraspinatus  Stand or sit with good posture. Grasp a __________ weight or an exercise band/tubing so that your hand is "thumbs-up," like when you shake hands.  Slowly lift your right / left hand from your thigh into the air, traveling about 30 degrees from straight out at your side. Lift your hand to shoulder height or as far as you can without increasing any shoulder pain. Initially, many people do not lift their hands above shoulder height.  Avoid shrugging your right / left shoulder as your arm rises by keeping your shoulder blade tucked down and toward your mid-back spine.  Hold for __________ seconds. Control the descent of your hand as you slowly return to your starting position. Repeat __________ times. Complete this exercise __________ times per day.  STRENGTH - Shoulder Extensors  Secure a rubber exercise band/tubing so that it is at the height of your shoulders when you are either standing or sitting on a firm arm-less chair.  With a thumbs-up grip, grasp an end of the band/tubing in each hand. Straighten your elbows and lift your hands straight in  front of you at shoulder height. Step back away from the secured end of band/tubing until it becomes tense.  Squeezing your shoulder blades together, pull your hands down to the sides of  your thighs. Do not allow your hands to go behind you.  Hold for __________ seconds. Slowly ease the tension on the band/tubing as you reverse the directions and return to the starting position. Repeat __________ times. Complete this exercise __________ times per day.  STRENGTH - Scapular Retractors  Secure a rubber exercise band/tubing so that it is at the height of your shoulders when you are either standing or sitting on a firm arm-less chair.  With a palm-down grip, grasp an end of the band/tubing in each hand. Straighten your elbows and lift your hands straight in front of you at shoulder height. Step back away from the secured end of band/tubing until it becomes tense.  Squeezing your shoulder blades together, draw your elbows back as you bend them. Keep your upper arm lifted away from your body throughout the exercise.  Hold __________ seconds. Slowly ease the tension on the band/tubing as you reverse the directions and return to the starting position. Repeat __________ times. Complete this exercise __________ times per day. STRENGTH  Scapular Depressors  Find a sturdy chair without wheels, such as a from a dining room table.  Keeping your feet on the floor, lift your bottom from the seat and lock your elbows.  Keeping your elbows straight, allow gravity to pull your body weight down. Your shoulders will rise toward your ears.  Raise your body against gravity by drawing your shoulder blades down your back, shortening the distance between your shoulders and ears. Although your feet should always maintain contact with the floor, your feet should progressively support less body weight as you get stronger.  Hold __________ seconds. In a controlled and slow manner, lower your body weight to begin the next repetition. Repeat __________ times. Complete this exercise __________ times per day.  Document Released: 02/21/2005 Document Revised: 07/02/2011 Document Reviewed: 07/22/2008 [facility]  Patient Information 2014 [place], [place].

## 2012-09-30 NOTE — Progress Notes (Signed)
Subjective:     Patient ID: [name], male   DOB: 05-May-1965, 47 y.o.   MRN: [id number]  HPI  [name]  Mar 06, 1966 [id number]  Patient Care Team: Catalina Pizza, MD as PCP - General (Internal Medicine)  This patient is a 47 y.o.male who presents today s/p surgery 09/17/2012:  POST-OPERATIVE DIAGNOSIS: recurrent ventral hernia  Incarcerated Richter's hernia   PROCEDURE: Procedure(s):  LAPAROSCOPIC and Open LYSIS OF ADHESIONS  LAPAROSCOPIC VENTRAL HERNIA with mesh  Placement of OnQ Pump   Patient comes in today feeling all right.  No nausea or vomiting.  Moving bowels.  No fevers or chills.   Right shoulder still stiff/sore but improving.  Using the arm better now.  Denies much abdominal pain.  Left periumbilical pain and swelling going down - better than preop.  Having flatus.  Having bowel movements.    Patient Active Problem List   Diagnosis Date Noted  . Incisional hernia, incarcerated 09/17/2012  . SBO (small bowel obstruction) 09/17/2012  . Diabetic neuropathy 03/25/2012  . Paroxysmal atrial fibrillation 03/25/2012  . DM type 2 (diabetes mellitus, type 2) 03/24/2012  . Hyperlipidemia 03/24/2012  . Pancreatitis, acute 03/23/2012  . Hyponatremia 03/23/2012  . Dehydration 03/23/2012  . Hyperglycemia 03/23/2012  . Alcohol abuse 03/23/2012  . GOUT 05/30/2009  . Morbid obesity 05/30/2009  . HYPERTENSION 05/30/2009  . OTHER SPECIFIED DISORDER OF INTESTINES 04/15/2009  . DERANGEMENT MENISCUS 05/19/2008  . KNEE PAIN 05/19/2008    Past Medical History  Diagnosis Date  . Hypertension   . Diverticulitis   . Diabetes mellitus without complication   . DM type 2 (diabetes mellitus, type 2) 03/24/2012  . Hyperlipidemia 03/24/2012  . Diabetic neuropathy 03/25/2012  . Paroxysmal atrial fibrillation 03/25/2012    Transient, occurred off of atenolol.  . Pancreatitis   . Incisional hernia without mention of obstruction or gangrene 02/07/2011    Past Surgical History   Procedure Laterality Date  . Colon surgery  04/15/2009  . Appendectomy  04/15/2009  . Laparoscopic cholecystectomy    . Hernia repair  01/25/11    lap ventral hernia repair x9  . Ventral hernia repair N/A 09/17/2012    Procedure: LAPAROSCOPIC VENTRAL HERNIA;  Surgeon: Ardeth Sportsman, MD;  Location: [facility];  Service: General;  Laterality: N/A;  . Laparoscopic lysis of adhesions N/A 09/17/2012    Procedure: LAPAROSCOPIC and Open  LYSIS OF ADHESIONS;  Surgeon: Ardeth Sportsman, MD;  Location: [facility];  Service: General;  Laterality: N/A;    History   Social History  . Marital Status: Single    Spouse Name: N/A    Number of Children: N/A  . Years of Education: N/A   Occupational History  . Not on file.   Social History Main Topics  . Smoking status: Current Every Day Smoker -- 1.00 packs/day for 30 years    Types: Cigarettes  . Smokeless tobacco: Never Used  . Alcohol Use: Yes  . Drug Use: No  . Sexually Active: Yes -- Male partner(s)   Other Topics Concern  . Not on file   Social History Narrative  . No narrative on file    No family history on file.  Current Outpatient Prescriptions  Medication Sig Dispense Refill  . allopurinol (ZYLOPRIM) 300 MG tablet Take 300 mg by mouth daily.       Marland Kitchen atenolol (TENORMIN) 100 MG tablet Take 100 mg by mouth daily.      Marland Kitchen CINNAMON PO Take by mouth.      Marland Kitchen  colchicine 0.6 MG tablet Take 0.6 mg by mouth every Tuesday, Thursday, Saturday, and Sunday.      . gabapentin (NEURONTIN) 100 MG capsule Take 1-2 capsules (100-200 mg total) by mouth at bedtime as needed. For burning sensation in feet.  30 capsule  0  . insulin aspart (NOVOLOG) 100 UNIT/ML injection Inject 7 Units into the skin every evening.      . insulin detemir (LEVEMIR) 100 UNIT/ML injection Inject 60 Units into the skin daily.      . insulin detemir (LEVEMIR) 100 UNIT/ML injection Inject 0.8 mLs (80 Units total) into the skin at bedtime.  10 mL  12  . MAGNESIUM-ZINC PO Take 1  tablet by mouth daily.      . Multiple Vitamin (MULTIVITAMIN WITH MINERALS) TABS Take 1 tablet by mouth daily.      . naproxen (NAPROSYN) 500 MG tablet Take 1 tablet (500 mg total) by mouth 2 (two) times daily with a meal.  20 tablet  0  . omeprazole (PRILOSEC) 40 MG capsule Take 40 mg by mouth every Tuesday, Thursday, Saturday, and Sunday.      . methocarbamol (ROBAXIN) 500 MG tablet Take 1 tablet (500 mg total) by mouth 4 (four) times daily.  80 tablet  1   No current facility-administered medications for this visit.     Allergies  Allergen Reactions  . Hydrocodone Itching    BP 150/98  Pulse 70  Temp(Src) 97.2 F (36.2 C) (Temporal)  Resp 16  Ht 5\' 11"  (1.803 m)  Wt 303 lb 3.2 oz (137.531 kg)  BMI 42.31 kg/m2  Ct Abdomen Pelvis W Contrast  09/17/2012   *RADIOLOGY REPORT*  Clinical Data: Right-sided abdominal pain and cramping.  Nausea. Mild leukocytosis, and red blood cells in the urine.  CT ABDOMEN AND PELVIS WITH CONTRAST  Technique:  Multidetector CT imaging of the abdomen and pelvis was performed following the standard protocol during bolus administration of intravenous contrast.  Contrast: OMNIPAQUE IOHEXOL 300 MG/ML  SOLN  Comparison: CT of the abdomen and pelvis performed 03/23/2012  Findings: The visualized lung bases are clear.  The liver and spleen are unremarkable in appearance.  The patient is status post cholecystectomy; clips are noted along the gallbladder fossa.  The pancreas and adrenal glands are unremarkable.  Nonspecific perinephric stranding is noted bilaterally; the kidneys are otherwise unremarkable in appearance.  No definite renal or ureteral stones are seen.  There is no evidence of hydronephrosis.  There is gradual dilution of contrast within the dilated small bowel loop, beginning at the proximal to mid ileum.  Small bowel loops are dilated to 4.3 cm in maximal diameter.  This extends to the level of the mid to distal ileum; a very short segment of ileum  herniates into a small left periumbilical hernia, with significant associated fluid and soft tissue inflammation.  This is the transition point, with relatively high-grade bowel obstruction due to the hernia, and decompression of more distal small bowel loops.  However, a small amount of air is seen in the distal ileum.  No free intra-abdominal air is seen to suggest bowel perforation.  The patient's bowel is difficult to follow due to multiple small and large bowel anastomoses.  The visualized anastomoses are grossly unremarkable in appearance.  Mild outpouching is noted at the patient's sigmoid colonic anastomosis.  The stomach is within normal limits.  No acute vascular abnormalities are seen.  Minimal diverticulosis is noted along the proximal sigmoid colon. The remaining colon is  otherwise grossly unremarkable.  The bladder is mildly distended and grossly unremarkable in appearance.  The prostate remains normal in size.  No inguinal lymphadenopathy is seen.  No acute osseous abnormalities are identified.  IMPRESSION:  1.  Relatively high-grade small bowel obstruction, with the transition point noted at a small left periumbilical hernia, where a very short segment of mid to distal ileum is entrapped, with significant associated fluid and soft tissue inflammation.  Dilatation of the more proximal small bowel loops to 4.3 cm in maximal diameter, with gradual dilution of ingested contrast. 2.  Bowel difficult to follow due to multiple small and large bowel anastomoses; the visualized anastomoses are grossly unremarkable in appearance. 3.  Minimal diverticulosis noted along the proximal sigmoid colon, without evidence of diverticulitis.  These results were called by telephone on 09/17/2012 at 04:14 a.m. to Dr. Donnetta Hutching, who verbally acknowledged these results.   Original Report Authenticated By: Tonia Ghent, M.D.     Review of Systems  Constitutional: Negative for fever, chills and diaphoresis.  HENT:  Negative for sore throat, trouble swallowing and neck pain.   Eyes: Negative for photophobia and visual disturbance.  Respiratory: Negative for choking and shortness of breath.   Cardiovascular: Negative for chest pain and palpitations.  Gastrointestinal: Negative for nausea, vomiting, abdominal distention, anal bleeding and rectal pain.  Genitourinary: Negative for dysuria, urgency, difficulty urinating and testicular pain.  Musculoskeletal: Positive for myalgias, back pain and arthralgias. Negative for gait problem.  Skin: Negative for color change and rash.  Neurological: Negative for dizziness, speech difficulty, weakness and numbness.  Hematological: Negative for adenopathy.  Psychiatric/Behavioral: Negative for hallucinations, confusion and agitation.       Objective:   Physical Exam  Constitutional: He is oriented to person, place, and time. He appears well-developed and well-nourished. No distress.  HENT:  Head: Normocephalic.  Mouth/Throat: Oropharynx is clear and moist. No oropharyngeal exudate.  Eyes: Conjunctivae and EOM are normal. Pupils are equal, round, and reactive to light. No scleral icterus.  Neck: Normal range of motion. No tracheal deviation present.  Cardiovascular: Normal rate, normal heart sounds and intact distal pulses.   Pulmonary/Chest: Effort normal. No respiratory distress.  Abdominal: Soft. He exhibits no distension. There is no tenderness. No hernia. Hernia confirmed negative in the ventral area, confirmed negative in the right inguinal area and confirmed negative in the left inguinal area.  Incisions clean with normal healing ridges.  No hernias.  LLQ Drain serous output.  Less than 10 mL a day now.  I went ahead and removed it.  He tolerated it fine.  Musculoskeletal: Normal range of motion. He exhibits no tenderness.  Neurological: He is alert and oriented to person, place, and time. No cranial nerve deficit. He exhibits normal muscle tone. Coordination  normal.  Skin: Skin is warm and dry. No rash noted. He is not diaphoretic.  Psychiatric: He has a normal mood and affect. His behavior is normal.       Assessment:     Two weeks status post emergent laparoscopic repair of recurrent Richter's SB hernia through center of mesh causing small bowel obstruction, recovering   Plan:     Increase activity as tolerated to regular activity.  Low impact exercise such as walking an hour a day at least ideal.  Do not push through pain.  Consider swimming and water aerobics for less stress on his back.  Anti-inflammatories heat and shoulder exercises.  Seems to be getting better.  If not improved or  worse, right shoulder series and consider follow up with primary care physician.  He tells me he is getting better, so I am hopeful this will gradually resolve.  Diet as tolerated.  Low fat high fiber diet ideal.  Bowel regimen with 30 g fiber a day and fiber supplement as needed to avoid problems.  Return to clinic in 1 month.  If markedly improved, f/u as needed.   Instructions discussed.  Followup with primary care physician for other health issues as would normally be done.  Questions answered.  The patient expressed understanding and appreciation

## 2012-10-29 ENCOUNTER — Encounter (INDEPENDENT_AMBULATORY_CARE_PROVIDER_SITE_OTHER): Payer: BC Managed Care – PPO | Admitting: Surgery

## 2012-12-14 ENCOUNTER — Emergency Department (HOSPITAL_COMMUNITY): Payer: BC Managed Care – PPO

## 2012-12-14 ENCOUNTER — Encounter (HOSPITAL_COMMUNITY): Payer: Self-pay | Admitting: *Deleted

## 2012-12-14 ENCOUNTER — Inpatient Hospital Stay (HOSPITAL_COMMUNITY)
Admission: EM | Admit: 2012-12-14 | Discharge: 2012-12-16 | DRG: 557 | Disposition: A | Discharge status: Home / Self Care | Payer: BC Managed Care – PPO | Attending: Internal Medicine | Admitting: Internal Medicine

## 2012-12-14 DIAGNOSIS — Y838 Other surgical procedures as the cause of abnormal reaction of the patient, or of later complication, without mention of misadventure at the time of the procedure: Secondary | ICD-10-CM | POA: Diagnosis present

## 2012-12-14 DIAGNOSIS — E1142 Type 2 diabetes mellitus with diabetic polyneuropathy: Secondary | ICD-10-CM | POA: Diagnosis present

## 2012-12-14 DIAGNOSIS — E871 Hypo-osmolality and hyponatremia: Secondary | ICD-10-CM

## 2012-12-14 DIAGNOSIS — E1165 Type 2 diabetes mellitus with hyperglycemia: Secondary | ICD-10-CM

## 2012-12-14 DIAGNOSIS — I4891 Unspecified atrial fibrillation: Secondary | ICD-10-CM | POA: Diagnosis present

## 2012-12-14 DIAGNOSIS — E785 Hyperlipidemia, unspecified: Secondary | ICD-10-CM | POA: Diagnosis present

## 2012-12-14 DIAGNOSIS — IMO0002 Reserved for concepts with insufficient information to code with codable children: Secondary | ICD-10-CM

## 2012-12-14 DIAGNOSIS — M109 Gout, unspecified: Secondary | ICD-10-CM

## 2012-12-14 DIAGNOSIS — E1140 Type 2 diabetes mellitus with diabetic neuropathy, unspecified: Secondary | ICD-10-CM

## 2012-12-14 DIAGNOSIS — F1010 Alcohol abuse, uncomplicated: Secondary | ICD-10-CM

## 2012-12-14 DIAGNOSIS — E1149 Type 2 diabetes mellitus with other diabetic neurological complication: Secondary | ICD-10-CM | POA: Diagnosis present

## 2012-12-14 DIAGNOSIS — Z794 Long term (current) use of insulin: Secondary | ICD-10-CM

## 2012-12-14 DIAGNOSIS — T8189XA Other complications of procedures, not elsewhere classified, initial encounter: Secondary | ICD-10-CM | POA: Diagnosis present

## 2012-12-14 DIAGNOSIS — Z6841 Body Mass Index (BMI) 40.0 and over, adult: Secondary | ICD-10-CM

## 2012-12-14 DIAGNOSIS — Z87891 Personal history of nicotine dependence: Secondary | ICD-10-CM

## 2012-12-14 DIAGNOSIS — E119 Type 2 diabetes mellitus without complications: Secondary | ICD-10-CM

## 2012-12-14 DIAGNOSIS — Z79899 Other long term (current) drug therapy: Secondary | ICD-10-CM

## 2012-12-14 DIAGNOSIS — K859 Acute pancreatitis without necrosis or infection, unspecified: Principal | ICD-10-CM

## 2012-12-14 DIAGNOSIS — I10 Essential (primary) hypertension: Secondary | ICD-10-CM

## 2012-12-14 LAB — URINALYSIS, ROUTINE W REFLEX MICROSCOPIC
Hgb urine dipstick: NEGATIVE
Leukocytes, UA: NEGATIVE
Specific Gravity, Urine: 1.043 — ABNORMAL HIGH (ref 1.005–1.030)
Urobilinogen, UA: 0.2 mg/dL (ref 0.0–1.0)

## 2012-12-14 LAB — COMPREHENSIVE METABOLIC PANEL
ALT: 66 U/L — ABNORMAL HIGH (ref 0–53)
AST: 60 U/L — ABNORMAL HIGH (ref 0–37)
Calcium: 10.7 mg/dL — ABNORMAL HIGH (ref 8.4–10.5)
Creatinine, Ser: 0.95 mg/dL (ref 0.50–1.35)
GFR calc Af Amer: >90 mL/min (ref >90)
Glucose, Bld: 304 mg/dL — ABNORMAL HIGH (ref 70–99)
Sodium: 130 mEq/L — ABNORMAL LOW (ref 135–145)
Total Protein: 8 g/dL (ref 6.0–8.3)

## 2012-12-14 LAB — CBC WITH DIFFERENTIAL/PLATELET
Basophils Absolute: 0 10*3/uL (ref 0.0–0.1)
Eosinophils Absolute: 0.1 10*3/uL (ref 0.0–0.7)
Eosinophils Relative: 1 % (ref 0–5)
MCH: 33.9 pg (ref 26.0–34.0)
MCHC: 35.6 g/dL (ref 30.0–36.0)
MCV: 95.3 fL (ref 78.0–100.0)
Platelets: 182 10*3/uL (ref 150–400)
RDW: 13 % (ref 11.5–15.5)

## 2012-12-14 LAB — URINE MICROSCOPIC-ADD ON

## 2012-12-14 MED ORDER — ALLOPURINOL 300 MG PO TABS
300.0000 mg | ORAL_TABLET | Freq: Every day | ORAL | Status: DC
  Administered 2012-12-15 – 2012-12-16 (×2): 300 mg via ORAL
  Filled 2012-12-14 (×2): qty 1

## 2012-12-14 MED ORDER — ONDANSETRON HCL 4 MG/2ML IJ SOLN
4.0000 mg | Freq: Once | INTRAMUSCULAR | Status: AC
  Administered 2012-12-14: 4 mg via INTRAVENOUS
  Filled 2012-12-14: qty 2

## 2012-12-14 MED ORDER — SODIUM CHLORIDE 0.9 % IV SOLN
1000.0000 mL | Freq: Once | INTRAVENOUS | Status: AC
  Administered 2012-12-14: 1000 mL via INTRAVENOUS

## 2012-12-14 MED ORDER — DIPHENHYDRAMINE HCL 50 MG/ML IJ SOLN
25.0000 mg | Freq: Once | INTRAMUSCULAR | Status: AC
  Administered 2012-12-14: 25 mg via INTRAVENOUS
  Filled 2012-12-14: qty 1

## 2012-12-14 MED ORDER — GABAPENTIN 100 MG PO CAPS
100.0000 mg | ORAL_CAPSULE | Freq: Every evening | ORAL | Status: DC | PRN
  Filled 2012-12-14: qty 2

## 2012-12-14 MED ORDER — MORPHINE SULFATE 2 MG/ML IJ SOLN
2.0000 mg | INTRAMUSCULAR | Status: DC | PRN
  Administered 2012-12-15 (×3): 4 mg via INTRAVENOUS
  Filled 2012-12-14 (×4): qty 2

## 2012-12-14 MED ORDER — SODIUM CHLORIDE 0.9 % IV SOLN
1000.0000 mL | INTRAVENOUS | Status: DC
  Administered 2012-12-15 – 2012-12-16 (×5): 1000 mL via INTRAVENOUS

## 2012-12-14 MED ORDER — ATENOLOL 100 MG PO TABS
100.0000 mg | ORAL_TABLET | Freq: Every day | ORAL | Status: DC
  Administered 2012-12-15 – 2012-12-16 (×2): 100 mg via ORAL
  Filled 2012-12-14 (×2): qty 1

## 2012-12-14 MED ORDER — COLCHICINE 0.6 MG PO TABS
0.6000 mg | ORAL_TABLET | ORAL | Status: DC
  Administered 2012-12-16: 0.6 mg via ORAL
  Filled 2012-12-14: qty 1

## 2012-12-14 MED ORDER — IOHEXOL 300 MG/ML  SOLN
100.0000 mL | Freq: Once | INTRAMUSCULAR | Status: AC | PRN
  Administered 2012-12-14: 100 mL via INTRAVENOUS

## 2012-12-14 MED ORDER — HYDROMORPHONE HCL PF 1 MG/ML IJ SOLN
1.0000 mg | Freq: Once | INTRAMUSCULAR | Status: AC
  Administered 2012-12-14: 1 mg via INTRAVENOUS
  Filled 2012-12-14: qty 1

## 2012-12-14 MED ORDER — IOHEXOL 300 MG/ML  SOLN
50.0000 mL | Freq: Once | INTRAMUSCULAR | Status: AC | PRN
  Administered 2012-12-14: 50 mL via ORAL

## 2012-12-14 MED ORDER — INSULIN ASPART 100 UNIT/ML ~~LOC~~ SOLN
0.0000 [IU] | SUBCUTANEOUS | Status: DC
  Administered 2012-12-15: 3 [IU] via SUBCUTANEOUS
  Administered 2012-12-15 (×4): 7 [IU] via SUBCUTANEOUS
  Administered 2012-12-15 – 2012-12-16 (×2): 4 [IU] via SUBCUTANEOUS
  Administered 2012-12-16 (×2): 3 [IU] via SUBCUTANEOUS

## 2012-12-14 MED ORDER — INSULIN DETEMIR 100 UNIT/ML ~~LOC~~ SOLN
30.0000 [IU] | Freq: Two times a day (BID) | SUBCUTANEOUS | Status: DC
  Administered 2012-12-15 – 2012-12-16 (×4): 30 [IU] via SUBCUTANEOUS
  Filled 2012-12-14 (×5): qty 0.3

## 2012-12-14 MED ORDER — PANTOPRAZOLE SODIUM 40 MG PO TBEC
80.0000 mg | DELAYED_RELEASE_TABLET | Freq: Every day | ORAL | Status: DC
  Administered 2012-12-15 – 2012-12-16 (×2): 80 mg via ORAL
  Filled 2012-12-14 (×2): qty 2

## 2012-12-14 MED ORDER — HEPARIN SODIUM (PORCINE) 5000 UNIT/ML IJ SOLN
5000.0000 [IU] | Freq: Three times a day (TID) | INTRAMUSCULAR | Status: DC
  Administered 2012-12-15 (×3): 5000 [IU] via SUBCUTANEOUS
  Filled 2012-12-14 (×5): qty 1

## 2012-12-14 NOTE — ED Notes (Signed)
Pt reports abd pain radiating to back. Pt uncomfotrable, nausea, denies diarrhea or constipation, pt has history of abd surgeries and problems

## 2012-12-14 NOTE — ED Provider Notes (Signed)
CSN: [id number]     Arrival date & time 12/14/12  1911 History     First MD Initiated Contact with Patient 12/14/12 1935     Chief Complaint  Patient presents with  . Abdominal Pain  . Back Pain   (Consider location/radiation/quality/duration/timing/severity/associated sxs/prior Treatment) The history is provided by the patient.   patient reports worsening upper abdominal pain over the past 24 hours.  Reports the pain radiates through to his back.  He has a history of pancreatitis and multiple abdominal surgeries.  He reports no abdominal distention.  He denies vomiting.  No fevers or chills.  No urinary complaints.  No chest pain or shortness of breath.  Symptoms are moderate in severity.  Pain is worsened by movement and palpation of his upper abdomen.  Pain is worsened by eating  Past Medical History  Diagnosis Date  . Hypertension   . Diverticulitis   . Diabetes mellitus without complication   . DM type 2 (diabetes mellitus, type 2) 03/24/2012  . Hyperlipidemia 03/24/2012  . Diabetic neuropathy 03/25/2012  . Paroxysmal atrial fibrillation 03/25/2012    Transient, occurred off of atenolol.  . Pancreatitis   . Incisional hernia without mention of obstruction or gangrene 02/07/2011   Past Surgical History  Procedure Laterality Date  . Colon surgery  04/15/2009  . Appendectomy  04/15/2009  . Laparoscopic cholecystectomy    . Hernia repair  01/25/11    lap ventral hernia repair x9  . Ventral hernia repair N/A 09/17/2012    Procedure: LAPAROSCOPIC VENTRAL HERNIA;  Surgeon: Ardeth Sportsman, MD;  Location: [facility];  Service: General;  Laterality: N/A;  . Laparoscopic lysis of adhesions N/A 09/17/2012    Procedure: LAPAROSCOPIC and Open  LYSIS OF ADHESIONS;  Surgeon: Ardeth Sportsman, MD;  Location: [facility];  Service: General;  Laterality: N/A;   No family history on file. History  Substance Use Topics  . Smoking status: Former Smoker -- 1.00 packs/day for 30 years    Types: Cigarettes   . Smokeless tobacco: Never Used  . Alcohol Use: Yes    Review of Systems  Gastrointestinal: Positive for abdominal pain.  Musculoskeletal: Positive for back pain.  All other systems reviewed and are negative.    Allergies  Hydrocodone  Home Medications   Current Outpatient Rx  Name  Route  Sig  Dispense  Refill  . allopurinol (ZYLOPRIM) 300 MG tablet   Oral   Take 300 mg by mouth daily.          Marland Kitchen atenolol (TENORMIN) 100 MG tablet   Oral   Take 100 mg by mouth daily.         Marland Kitchen CINNAMON PO   Oral   Take by mouth.         . colchicine 0.6 MG tablet   Oral   Take 0.6 mg by mouth every Tuesday, Thursday, Saturday, and Sunday.         . gabapentin (NEURONTIN) 100 MG capsule   Oral   Take 1-2 capsules (100-200 mg total) by mouth at bedtime as needed. For burning sensation in feet.   30 capsule   0   . insulin detemir (LEVEMIR) 100 UNIT/ML injection   Subcutaneous   Inject 60 Units into the skin 2 (two) times daily.          Marland Kitchen MAGNESIUM-ZINC PO   Oral   Take 1 tablet by mouth daily.         Marland Kitchen omeprazole (PRILOSEC)  40 MG capsule   Oral   Take 40 mg by mouth every Tuesday, Thursday, Saturday, and Sunday.         . insulin aspart (NOVOLOG) 100 UNIT/ML injection   Subcutaneous   Inject 7 Units into the skin every evening.          BP 123/84  Pulse 70  Temp(Src) 98.8 F (37.1 C) (Oral)  Resp 18  SpO2 97% Physical Exam  Nursing note and vitals reviewed. Constitutional: He is oriented to person, place, and time. He appears well-developed and well-nourished.  HENT:  Head: Normocephalic and atraumatic.  Eyes: EOM are normal.  Neck: Normal range of motion.  Cardiovascular: Normal rate, regular rhythm, normal heart sounds and intact distal pulses.   Pulmonary/Chest: Effort normal and breath sounds normal. No respiratory distress.  Abdominal: Soft. He exhibits no distension.  Epigastric tenderness without guarding or rebound  Genitourinary:  Rectum normal.  Musculoskeletal: Normal range of motion.  Neurological: He is alert and oriented to person, place, and time.  Skin: Skin is warm and dry.  Psychiatric: He has a normal mood and affect. Judgment normal.    ED Course   Procedures (including critical care time)  Labs Reviewed  COMPREHENSIVE METABOLIC PANEL - Abnormal; Notable for the following:    Sodium 130 (*)    Chloride 89 (*)    Glucose, Bld 304 (*)    Calcium 10.7 (*)    AST 60 (*)    ALT 66 (*)    All other components within normal limits  LIPASE, BLOOD - Abnormal; Notable for the following:    Lipase 875 (*)    All other components within normal limits  CBC WITH DIFFERENTIAL  URINALYSIS, ROUTINE W REFLEX MICROSCOPIC   Ct Abdomen Pelvis W Contrast  12/14/2012   *RADIOLOGY REPORT*  Clinical Data: Abdominal and back pain  CT ABDOMEN AND PELVIS WITH CONTRAST  Technique:  Multidetector CT imaging of the abdomen and pelvis was performed following the standard protocol during bolus administration of intravenous contrast.  Contrast: OMNIPAQUE IOHEXOL 300 MG/ML  SOLN  Comparison: Multiple priors, most recent 09/17/2012  Findings: Normal heart size.  Linear right lung opacities, favor atelectasis.  Diffuse hepatic steatosis.  Absent gallbladder.  No biliary ductal dilatation.  Unremarkable spleen and adrenal glands.  There is extensive stranding within the porta hepatis, abutting the second portion of the duodenum and the pancreatic head.  There are reactive size porta hepatis lymph nodes. Fluid tracks inferiorly along the anterior pararenal space on the right.  No loculated fluid collection.  No pancreatic ductal dilatation or areas of abnormal enhancement.  Symmetric renal enhancement.  No hydroureteronephrosis.  No CT evidence for colitis.  Colonic diverticulosis.  Abutting the sigmoid colon, there is a lobulated high fluid or soft tissue attenuation with peripheral calcification, measuring 2.8 cm, similar to prior.   Other than the duodenum described above, small bowel loops are normal caliber proximally and decompressed distally. Multiple loops closely adherent to the abdominal wall suggest effusions.  There is evidence  of multiple prior bowel surgeries.  Appendix not definitively identified.  No right lower quadrant inflammation.  Thin-walled bladder.  Scattered atherosclerosis of the aorta and branch vessels without aneurysmal dilatation.  Multilevel degenerative changes.  No acute osseous finding.  IMPRESSION: Extensive peripancreatic/periduodenal fat stranding and ill-defined fluid.  Difficult at this point to delineate the process origin, with the differential including acute pancreatitis or acute duodenitis/ ulcerative disease. No free intraperitoneal air or abscess.  Correlate with clinical history, lipase, and consider EGD.  Hepatic steatosis.  Lobulated high fluid or soft tissue attenuation with peripheral calcification abutting the sigmoid colon, near the site of a colocolonic anastomosis. This is nonspecific, however, similar to prior.  Discussed via telephone with Sharen Hones at 09:50 p.m. on 12/14/2012   Original Report Authenticated By: Jearld Lesch, M.D.   I personally reviewed the imaging tests through PACS system I reviewed available ER/hospitalization records through the EMR   1. Pancreatitis     MDM  Patient be admitted the hospital for pancreatitis.  CT scan with inflammatory changes around the pancreas.  Doubt duodenitis or ulcer disease.  Lipase is 875.  Pain continues in the ER.  We'll attempt to control  Lyanne Co, MD 12/14/12 2232

## 2012-12-14 NOTE — ED Notes (Signed)
Pt has diffuse abdominal pain 10/10  Pt has history of multiple surgeries on his abdominal area

## 2012-12-14 NOTE — ED Notes (Signed)
Transported to CT 

## 2012-12-14 NOTE — H&P (Signed)
[facility] History and Physical  [name] ZOX:[id number] DOB: 1966/03/19 DOA: 12/14/2012  Referring physician: ED PCP: Catalina Pizza, MD   Chief Complaint: Abdominal pain  HPI: [name] is a 47 y.o. male who presents to the ED with 24 hour history of abdominal pain.  Pain has been worsening, pain is located in his epigastric area with radiation to his back.  No vomiting, no worsening abdominal distention, has had BMs.  Symptoms are moderate in severity.  Pain worsened by movement and palpation of upper abdomen, pain worse with eating.  In the ED he was found to have lipase of 875, CT abd was consistent with pancreatitis (as is his history of acute pancreatitis in the past and continued EtOH use).  Hospitalist asked to admit.  Review of Systems: 12 systems reviewed and otherwise negative.  Past Medical History  Diagnosis Date  . Hypertension   . Diverticulitis   . Diabetes mellitus without complication   . DM type 2 (diabetes mellitus, type 2) 03/24/2012  . Hyperlipidemia 03/24/2012  . Diabetic neuropathy 03/25/2012  . Paroxysmal atrial fibrillation 03/25/2012    Transient, occurred off of atenolol.  . Pancreatitis   . Incisional hernia without mention of obstruction or gangrene 02/07/2011   Past Surgical History  Procedure Laterality Date  . Colon surgery  04/15/2009  . Appendectomy  04/15/2009  . Laparoscopic cholecystectomy    . Hernia repair  01/25/11    lap ventral hernia repair x9  . Ventral hernia repair N/A 09/17/2012    Procedure: LAPAROSCOPIC VENTRAL HERNIA;  Surgeon: Ardeth Sportsman, MD;  Location: [facility];  Service: General;  Laterality: N/A;  . Laparoscopic lysis of adhesions N/A 09/17/2012    Procedure: LAPAROSCOPIC and Open  LYSIS OF ADHESIONS;  Surgeon: Ardeth Sportsman, MD;  Location: [facility];  Service: General;  Laterality: N/A;   Social History:  reports that he has quit smoking. His smoking use included Cigarettes. He has a 30 pack-year smoking history.  He has never used smokeless tobacco. He reports that  drinks alcohol. He reports that he does not use illicit drugs.   Allergies  Allergen Reactions  . Hydrocodone Itching    No family history on file.   Prior to Admission medications   Medication Sig Start Date End Date Taking? Authorizing Provider  allopurinol (ZYLOPRIM) 300 MG tablet Take 300 mg by mouth daily.  01/21/11  Yes Historical Provider, MD  atenolol (TENORMIN) 100 MG tablet Take 100 mg by mouth daily.   Yes Historical Provider, MD  CINNAMON PO Take by mouth.   Yes Historical Provider, MD  colchicine 0.6 MG tablet Take 0.6 mg by mouth every Tuesday, Thursday, Saturday, and Sunday.   Yes Historical Provider, MD  gabapentin (NEURONTIN) 100 MG capsule Take 1-2 capsules (100-200 mg total) by mouth at bedtime as needed. For burning sensation in feet. 03/25/12  Yes Elliot Cousin, MD  insulin detemir (LEVEMIR) 100 UNIT/ML injection Inject 60 Units into the skin 2 (two) times daily.    Yes Historical Provider, MD  MAGNESIUM-ZINC PO Take 1 tablet by mouth daily.   Yes Historical Provider, MD  omeprazole (PRILOSEC) 40 MG capsule Take 40 mg by mouth every Tuesday, Thursday, Saturday, and Sunday.   Yes Historical Provider, MD  insulin aspart (NOVOLOG) 100 UNIT/ML injection Inject 7 Units into the skin every evening.    Historical Provider, MD   Physical Exam: Filed Vitals:   12/14/12 1924  BP: 123/84  Pulse: 70  Temp:  98.8 F (37.1 C)  Resp: 18    General:  NAD, resting comfortably in bed Eyes: PEERLA EOMI ENT: mucous membranes moist Neck: supple w/o JVD Cardiovascular: RRR w/o MRG Respiratory: CTA B Abdomen: soft, epigastric tenderness, there is a 1 cm open wound area over a previous surgical scar in his lower abdomen in the midline, he reports this has been present since his last surgery several months ago, nd, bs+ Skin: no rash nor lesion Musculoskeletal: MAE, full ROM all 4 extremities Psychiatric: normal tone and  affect Neurologic: AAOx3, grossly non-focal  Labs on Admission:  Basic Metabolic Panel:  Recent Labs Lab 12/14/12 2034  NA 130*  K 4.4  CL 89*  CO2 29  GLUCOSE 304*  BUN 20  CREATININE 0.95  CALCIUM 10.7*   Liver Function Tests:  Recent Labs Lab 12/14/12 2034  AST 60*  ALT 66*  ALKPHOS 57  BILITOT 0.9  PROT 8.0  ALBUMIN 3.8    Recent Labs Lab 12/14/12 2034  LIPASE 875*   No results found for this basename: AMMONIA,  in the last 168 hours CBC:  Recent Labs Lab 12/14/12 2034  WBC 8.3  NEUTROABS 5.8  HGB 14.4  HCT 40.5  MCV 95.3  PLT 182   Cardiac Enzymes: No results found for this basename: CKTOTAL, CKMB, CKMBINDEX, TROPONINI,  in the last 168 hours  BNP (last 3 results) No results found for this basename: PROBNP,  in the last 8760 hours CBG: No results found for this basename: GLUCAP,  in the last 168 hours  Radiological Exams on Admission: Ct Abdomen Pelvis W Contrast  12/14/2012   *RADIOLOGY REPORT*  Clinical Data: Abdominal and back pain  CT ABDOMEN AND PELVIS WITH CONTRAST  Technique:  Multidetector CT imaging of the abdomen and pelvis was performed following the standard protocol during bolus administration of intravenous contrast.  Contrast: OMNIPAQUE IOHEXOL 300 MG/ML  SOLN  Comparison: Multiple priors, most recent 09/17/2012  Findings: Normal heart size.  Linear right lung opacities, favor atelectasis.  Diffuse hepatic steatosis.  Absent gallbladder.  No biliary ductal dilatation.  Unremarkable spleen and adrenal glands.  There is extensive stranding within the porta hepatis, abutting the second portion of the duodenum and the pancreatic head.  There are reactive size porta hepatis lymph nodes. Fluid tracks inferiorly along the anterior pararenal space on the right.  No loculated fluid collection.  No pancreatic ductal dilatation or areas of abnormal enhancement.  Symmetric renal enhancement.  No hydroureteronephrosis.  No CT evidence for  colitis.  Colonic diverticulosis.  Abutting the sigmoid colon, there is a lobulated high fluid or soft tissue attenuation with peripheral calcification, measuring 2.8 cm, similar to prior.  Other than the duodenum described above, small bowel loops are normal caliber proximally and decompressed distally. Multiple loops closely adherent to the abdominal wall suggest effusions.  There is evidence  of multiple prior bowel surgeries.  Appendix not definitively identified.  No right lower quadrant inflammation.  Thin-walled bladder.  Scattered atherosclerosis of the aorta and branch vessels without aneurysmal dilatation.  Multilevel degenerative changes.  No acute osseous finding.  IMPRESSION: Extensive peripancreatic/periduodenal fat stranding and ill-defined fluid.  Difficult at this point to delineate the process origin, with the differential including acute pancreatitis or acute duodenitis/ ulcerative disease. No free intraperitoneal air or abscess.  Correlate with clinical history, lipase, and consider EGD.  Hepatic steatosis.  Lobulated high fluid or soft tissue attenuation with peripheral calcification abutting the sigmoid colon, near the site of  a colocolonic anastomosis. This is nonspecific, however, similar to prior.  Discussed via telephone with Sharen Hones at 09:50 p.m. on 12/14/2012   Original Report Authenticated By: Jearld Lesch, M.D.    EKG: Independently reviewed.  Assessment/Plan Principal Problem:   Pancreatitis, acute Active Problems:   HYPERTENSION   Alcohol abuse   DM type 2 (diabetes mellitus, type 2)   1. Acute EtOH pancreatitis - patient is already surgically absent his gallbladder, lipase elevation and CT abd are both most consistent with pancreatitis and this is also will not be his first bout of EtOH pancreatitis.  NPO except ice chips, pain control, IVF, repeat labs in AM.  Again advised patient to quit all EtOH.  He only drinks 3-4 drinks a day at this time and has never  had withdrawals, very functional alcoholic but now causing pancreatitis.  Triglycerides while elevated in December were only 348 (and usually 100-200 before then in the past), which is no where near high enough to suspect this as the primary cause of his pancreatitis. 2. DM 2- putting patient on half his home basal dose and a high dose SSI Q4H, but it is likely he will require more insulin given how resistant he is at baseline and apparently even his full home dose basal was not enough to control his BGLs. 3. HTN - continue home meds. 4. Non healing surgical wound - 1 cm in midline of abdomen, does not appear to be infected nor draining like a fistula would, and dosent appear to be an acute issue as it has been present for many months at the very least, but likely need to follow up with surgery regarding this.    Code Status: Full Code (must indicate code status--if unknown or must be presumed, indicate so) Family Communication: Spoke with wife at bedside (indicate person spoken with, if applicable, with phone number if by telephone) Disposition Plan: Admit to inpatient (indicate anticipated LOS)  Time spent: 70 min  GARDNER, JARED M. [facility] Pager [phone]  If 7PM-7AM, please contact night-coverage [email or web address] Password [email or web address] 12/14/2012, 11:10 PM

## 2012-12-15 DIAGNOSIS — E1149 Type 2 diabetes mellitus with other diabetic neurological complication: Secondary | ICD-10-CM

## 2012-12-15 DIAGNOSIS — IMO0002 Reserved for concepts with insufficient information to code with codable children: Secondary | ICD-10-CM | POA: Diagnosis present

## 2012-12-15 DIAGNOSIS — E1142 Type 2 diabetes mellitus with diabetic polyneuropathy: Secondary | ICD-10-CM

## 2012-12-15 DIAGNOSIS — E1165 Type 2 diabetes mellitus with hyperglycemia: Secondary | ICD-10-CM

## 2012-12-15 LAB — RAPID URINE DRUG SCREEN, HOSP PERFORMED
Amphetamines: NOT DETECTED
Barbiturates: NOT DETECTED
Cocaine: NOT DETECTED
Opiates: POSITIVE — AB
Tetrahydrocannabinol: NOT DETECTED

## 2012-12-15 LAB — CBC
HCT: 38.3 % — ABNORMAL LOW (ref 39.0–52.0)
MCH: 32.6 pg (ref 26.0–34.0)
MCV: 96 fL (ref 78.0–100.0)
Platelets: 168 10*3/uL (ref 150–400)
RBC: 3.99 MIL/uL — ABNORMAL LOW (ref 4.22–5.81)
RDW: 13.1 % (ref 11.5–15.5)
WBC: 7.8 10*3/uL (ref 4.0–10.5)

## 2012-12-15 LAB — GLUCOSE, CAPILLARY
Glucose-Capillary: 216 mg/dL — ABNORMAL HIGH (ref 70–99)
Glucose-Capillary: 226 mg/dL — ABNORMAL HIGH (ref 70–99)
Glucose-Capillary: 149 mg/dL — ABNORMAL HIGH (ref 70–99)

## 2012-12-15 LAB — COMPREHENSIVE METABOLIC PANEL
AST: 41 U/L — ABNORMAL HIGH (ref 0–37)
Albumin: 3.3 g/dL — ABNORMAL LOW (ref 3.5–5.2)
Alkaline Phosphatase: 48 U/L (ref 39–117)
Chloride: 92 mEq/L — ABNORMAL LOW (ref 96–112)
Potassium: 3.6 mEq/L (ref 3.5–5.1)
Sodium: 130 mEq/L — ABNORMAL LOW (ref 135–145)
Total Bilirubin: 0.7 mg/dL (ref 0.3–1.2)
Total Protein: 7 g/dL (ref 6.0–8.3)

## 2012-12-15 MED ORDER — SODIUM CHLORIDE 0.9 % IV SOLN: INTRAVENOUS | Status: AC

## 2012-12-15 MED ORDER — MORPHINE SULFATE 2 MG/ML IJ SOLN
2.0000 mg | INTRAMUSCULAR | Status: DC | PRN
  Administered 2012-12-15 (×2): 4 mg via INTRAVENOUS
  Filled 2012-12-15: qty 2

## 2012-12-15 MED ORDER — ADULT MULTIVITAMIN W/MINERALS CH
1.0000 | ORAL_TABLET | Freq: Every day | ORAL | Status: DC
  Administered 2012-12-15 – 2012-12-16 (×2): 1 via ORAL
  Filled 2012-12-15 (×2): qty 1

## 2012-12-15 MED ORDER — DIPHENHYDRAMINE HCL 50 MG/ML IJ SOLN
25.0000 mg | Freq: Four times a day (QID) | INTRAMUSCULAR | Status: DC | PRN
  Administered 2012-12-15 (×2): 25 mg via INTRAVENOUS
  Filled 2012-12-15 (×2): qty 1

## 2012-12-15 MED ORDER — POLYETHYLENE GLYCOL 3350 17 G PO PACK
17.0000 g | PACK | Freq: Two times a day (BID) | ORAL | Status: DC
  Administered 2012-12-15 (×2): 17 g via ORAL
  Filled 2012-12-15 (×4): qty 1

## 2012-12-15 MED ORDER — DOCUSATE SODIUM 100 MG PO CAPS
100.0000 mg | ORAL_CAPSULE | Freq: Two times a day (BID) | ORAL | Status: DC
  Administered 2012-12-15 – 2012-12-16 (×3): 100 mg via ORAL
  Filled 2012-12-15 (×5): qty 1

## 2012-12-15 MED ORDER — HYDROXYZINE HCL 10 MG PO TABS
10.0000 mg | ORAL_TABLET | Freq: Three times a day (TID) | ORAL | Status: DC | PRN
  Administered 2012-12-15: 10 mg via ORAL
  Filled 2012-12-15: qty 1

## 2012-12-15 NOTE — Progress Notes (Signed)
[facility] PROGRESS NOTE  [name]:[id number] DOB: 1965-10-26 DOA: 12/14/2012 PCP: Catalina Pizza, MD  Brief narrative: 47 y.o. male with past medical history of uncontrolled diabetes, alcohol abuse, hypertension, and gout who presented to [facility] ED with worsening abdominal pain in the epigastric area radiating to his back for past 24 hours prior to this admission. Patient reported no associated fever. In ED, vital signs were stable with blood pressure 115/72, heart rate 58 - 70 and T max 98.8 F. oxygen saturation was 97% on room air. Patient was found to have lipase level of 875 and CT abdomen was consistent with acute pancreatitis.  Assessment/Plan:  Principal Problem:   Pancreatitis, acute - Perhaps related to history of alcohol abuse - Continue current supportive care with bowel rest, IV fluids and analgesia (Dilaudid 2-4 mg every 3 hours as needed IV for severe pain) - Spoke with GI on call and recommendation was to treat this as acute pancreatitis and if no significant improvement to call for an official consultation Active Problems:   GOUT  -Continue allopurinol and colchicine   Diabetes mellitus type 2, uncontrolled, with complications - complications of diabetic neuropathy - A1c in 08/2012 7.4 indicating somewhat poor glycemic control - Continue sliding scale insulin - Since patient is n.p.o. we decreased the dose of Levemir to 30 units twice a day (home dose 60 units BID) - CBGs in past 24 hours: 216, 215, 226   Diabetic neuropathy - continue gabapentin   Morbid obesity - Now on n.p.o. acute to acute pancreatitis   HYPERTENSION - Blood pressure 141/91 - Continue atenolol 100 mg daily   Hyponatremia - Likely dehydration - Continue IV fluids - Followup BMP in the morning   Alcohol abuse - ethanol level within normal limits on this admission - CIWA ordered   Code Status: full code Family Communication: no family at the bedside Disposition Plan: home when  stable  Manson Passey, MD  [facility] Pager [phone]  If 7PM-7AM, please contact night-coverage [email or web address] Password [email or web address] 12/15/2012, 7:28 AM   LOS: 1 day   Consultants:  None   Procedures:  None   Antibiotics:  None   HPI/Subjective: Still with mid abdominal pain.  Objective: Filed Vitals:   12/14/12 1924 12/15/12 0005 12/15/12 0530  BP: 123/84 115/72 141/91  Pulse: 70 63 58  Temp: 98.8 F (37.1 C) 98 F (36.7 C) 98.3 F (36.8 C)  TempSrc: Oral Oral Oral  Resp: 18 18 18   Height:  5\' 11"  (1.803 m)   Weight:  130.727 kg (288 lb 3.2 oz)   SpO2: 97% 97% 99%    Intake/Output Summary (Last 24 hours) at 12/15/12 8657 Last data filed at 12/15/12 0645  Gross per 24 hour  Intake   1211 ml  Output      0 ml  Net   1211 ml    Exam:   General:  Pt is alert, follows commands appropriately, not in acute distress  Cardiovascular: Regular rate and rhythm, S1/S2, no murmurs, no rubs, no gallops  Respiratory: Clear to auscultation bilaterally, no wheezing, no crackles, no rhonchi  Abdomen: distended and tender in mid upper abdomen, bowel sounds present, no guarding  Extremities: No edema, pulses DP and PT palpable bilaterally  Neuro: Grossly nonfocal  Data Reviewed: Basic Metabolic Panel:  Recent Labs Lab 12/14/12 2034 12/15/12 0355  NA 130* 130*  K 4.4 3.6  CL 89* 92*  CO2 29 29  GLUCOSE 304* 220*  BUN 20 17  CREATININE  0.95 0.97  CALCIUM 10.7* 9.3   Liver Function Tests:  Recent Labs Lab 12/14/12 2034 12/15/12 0355  AST 60* 41*  ALT 66* 53  ALKPHOS 57 48  BILITOT 0.9 0.7  PROT 8.0 7.0  ALBUMIN 3.8 3.3*    Recent Labs Lab 12/14/12 2034  LIPASE 875*   No results found for this basename: AMMONIA,  in the last 168 hours CBC:  Recent Labs Lab 12/14/12 2034 12/15/12 0355  WBC 8.3 7.8  NEUTROABS 5.8  --   HGB 14.4 13.0  HCT 40.5 38.3*  MCV 95.3 96.0  PLT 182 168   Cardiac Enzymes: No results found for this basename: CKTOTAL, CKMB,  CKMBINDEX, TROPONINI,  in the last 168 hours BNP: No components found with this basename: POCBNP,  CBG:  Recent Labs Lab 12/14/12 2354 12/15/12 0358  GLUCAP 232* 216*    Studies: Ct Abdomen Pelvis W Contrast 12/14/2012   * IMPRESSION: Extensive peripancreatic/periduodenal fat stranding and ill-defined fluid.  Difficult at this point to delineate the process origin, with the differential including acute pancreatitis or acute duodenitis/ ulcerative disease. No free intraperitoneal air or abscess.  Correlate with clinical history, lipase, and consider EGD.  Hepatic steatosis.  Lobulated high fluid or soft tissue attenuation with peripheral calcification abutting the sigmoid colon, near the site of a colocolonic anastomosis.    Scheduled Meds: . allopurinol  300 mg Oral Daily  . atenolol  100 mg Oral Daily  . colchicine  0.6 mg Oral Q T,Th,S,Su  . insulin aspart  0-20 Units Subcutaneous Q4H  . insulin detemir  30 Units Subcutaneous BID  . pantoprazole  80 mg Oral Daily

## 2012-12-15 NOTE — Progress Notes (Signed)
INITIAL NUTRITION ASSESSMENT  DOCUMENTATION CODES Per approved criteria  -Morbid Obesity   INTERVENTION: - Diet advancement per MD - Multivitamin 1 tablet PO daily r/t pt with history of alcohol abuse - Encouraged pt to follow low fat diet at discharge and avoid alcohol to help with pancreatitis - Will continue to monitor   NUTRITION DIAGNOSIS: Inadequate oral intake related to inability to eat as evidenced by NPO.   Goal: Advance diet as tolerated to low fat, diabetic diet  Monitor:  Weights, labs, diet advancement  Reason for Assessment: Nutrition risk   47 y.o. male  Admitting Dx: Pancreatitis, acute  ASSESSMENT: Pt discussed during multidisciplinary rounds.   Pt with history of uncontrolled DM, alcohol abuse, gout, and pancreatitis. Met with pt who states "I apparently wasn't eating the right things before I cam here". States he had been seen by an RD as an outpatient but did not follow her advice on healthy eating. Admits to eating a high fat diet including frequently consuming sausage biscuits. Pt reports eating 3 meals/day, drinking occasionally during the week, and consuming 2-3 drinks in the weekend. Pt reports weighing 250 pounds 2 years ago but then going up to 300 pounds after he quit smoking. Pt reports PTA he could not get his blood sugars to go below 200 mg/dL. Pt denies having any nutrition education needs. Pt c/o abdominal pain.   Pt found to have acute pancreatitis and has elevated lipase.   Lipase  Date Value Range Status  12/15/2012 943* 11 - 59 U/L Final      Height: Ht Readings from Last 1 Encounters:  12/15/12 5\' 11"  (1.803 m)    Weight: Wt Readings from Last 1 Encounters:  12/15/12 288 lb 3.2 oz (130.727 kg)    Ideal Body Weight: 172 lb  % Ideal Body Weight:167%  Wt Readings from Last 10 Encounters:  12/15/12 288 lb 3.2 oz (130.727 kg)  09/30/12 303 lb 3.2 oz (137.531 kg)  09/17/12 300 lb (136.079 kg)  09/17/12 300 lb (136.079 kg)   03/24/12 287 lb 4.2 oz (130.3 kg)  03/05/11 294 lb 8 oz (133.584 kg)  02/07/11 299 lb 8 oz (135.852 kg)  11/06/10 305 lb (138.347 kg)  08/10/09 238 lb 6.4 oz (108.138 kg)  08/02/09 238 lb 4 oz (108.069 kg)    Usual Body Weight: 300 lb  % Usual Body Weight: 96%  BMI:  Body mass index is 40.21 kg/(m^2). Class III extreme obesity  Estimated Nutritional Needs: Kcal: 1950-2350 Protein: 120g Fluid: 1.9-2.3L/day   Skin: Mid mold unhealed lower abdominal surgical wound   Diet Order: NPO  EDUCATION NEEDS: -No education needs identified at this time   Intake/Output Summary (Last 24 hours) at 12/15/12 1432 Last data filed at 12/15/12 1213  Gross per 24 hour  Intake   1211 ml  Output      0 ml  Net   1211 ml    Last BM: PTA  Labs:   Recent Labs Lab 12/14/12 2034 12/15/12 0355  NA 130* 130*  K 4.4 3.6  CL 89* 92*  CO2 29 29  BUN 20 17  CREATININE 0.95 0.97  CALCIUM 10.7* 9.3  GLUCOSE 304* 220*    CBG (last 3)   Recent Labs  12/15/12 0358 12/15/12 0756 12/15/12 1206  GLUCAP 216* 215* 226*    Scheduled Meds: . sodium chloride   Intravenous STAT  . allopurinol  300 mg Oral Daily  . atenolol  100 mg Oral Daily  . [  START ON 12/16/2012] colchicine  0.6 mg Oral Q T,Th,S,Su  . docusate sodium  100 mg Oral BID  . insulin aspart  0-20 Units Subcutaneous Q4H  . insulin detemir  30 Units Subcutaneous BID  . multivitamin with minerals  1 tablet Oral Daily  . pantoprazole  80 mg Oral Daily  . polyethylene glycol  17 g Oral BID    Continuous Infusions: . sodium chloride 1,000 mL (12/15/12 0747)    Past Medical History  Diagnosis Date  . Hypertension   . Diverticulitis   . Diabetes mellitus without complication   . DM type 2 (diabetes mellitus, type 2) 03/24/2012  . Hyperlipidemia 03/24/2012  . Diabetic neuropathy 03/25/2012  . Paroxysmal atrial fibrillation 03/25/2012    Transient, occurred off of atenolol.  . Pancreatitis   . Incisional hernia without  mention of obstruction or gangrene 02/07/2011    Past Surgical History  Procedure Laterality Date  . Colon surgery  04/15/2009  . Appendectomy  04/15/2009  . Laparoscopic cholecystectomy    . Hernia repair  01/25/11    lap ventral hernia repair x9  . Ventral hernia repair N/A 09/17/2012    Procedure: LAPAROSCOPIC VENTRAL HERNIA;  Surgeon: Ardeth Sportsman, MD;  Location: [facility];  Service: General;  Laterality: N/A;  . Laparoscopic lysis of adhesions N/A 09/17/2012    Procedure: LAPAROSCOPIC and Open  LYSIS OF ADHESIONS;  Surgeon: Ardeth Sportsman, MD;  Location: [facility];  Service: General;  Laterality: N/A;    Levon Hedger MS, RD, LDN [phone] Pager [phone] After Hours Pager

## 2012-12-15 NOTE — Plan of Care (Addendum)
Problem: Phase I Progression Outcomes Goal: OOB as tolerated unless otherwise ordered Outcome: Completed/Met Date Met:  12/15/12 Ambulated in hallway several times this afternoon and evening. Refusing to wear SCDs.

## 2012-12-15 NOTE — Plan of Care (Signed)
Problem: Phase I Progression Outcomes Goal: Pain controlled with appropriate interventions Outcome: Completed/Met Date Met:  12/15/12 Minimal pain in abdomen. <2/10.

## 2012-12-16 DIAGNOSIS — M109 Gout, unspecified: Secondary | ICD-10-CM

## 2012-12-16 LAB — COMPREHENSIVE METABOLIC PANEL
AST: 49 U/L — ABNORMAL HIGH (ref 0–37)
Albumin: 2.9 g/dL — ABNORMAL LOW (ref 3.5–5.2)
Alkaline Phosphatase: 44 U/L (ref 39–117)
CO2: 27 mEq/L (ref 19–32)
Chloride: 98 mEq/L (ref 96–112)
GFR calc non Af Amer: >90 mL/min (ref >90)
Potassium: 3.5 mEq/L (ref 3.5–5.1)
Total Bilirubin: 0.7 mg/dL (ref 0.3–1.2)

## 2012-12-16 LAB — LIPASE, BLOOD: Lipase: 165 U/L — ABNORMAL HIGH (ref 11–59)

## 2012-12-16 LAB — CBC
MCH: 32.7 pg (ref 26.0–34.0)
MCHC: 33.5 g/dL (ref 30.0–36.0)
MCV: 97.6 fL (ref 78.0–100.0)
Platelets: 141 10*3/uL — ABNORMAL LOW (ref 150–400)
RDW: 13.2 % (ref 11.5–15.5)

## 2012-12-16 LAB — GLUCOSE, CAPILLARY
Glucose-Capillary: 138 mg/dL — ABNORMAL HIGH (ref 70–99)
Glucose-Capillary: 126 mg/dL — ABNORMAL HIGH (ref 70–99)

## 2012-12-16 MED ORDER — HYDROCODONE-ACETAMINOPHEN 5-325 MG PO TABS: 1.0000 | ORAL_TABLET | Freq: Four times a day (QID) | ORAL | Status: DC | PRN

## 2012-12-16 MED ORDER — BIOTENE DRY MOUTH MT LIQD
15.0000 mL | Freq: Two times a day (BID) | OROMUCOSAL | Status: DC
  Administered 2012-12-16: 15 mL via OROMUCOSAL

## 2012-12-16 MED ORDER — INSULIN ASPART 100 UNIT/ML ~~LOC~~ SOLN: 0.0000 [IU] | Freq: Three times a day (TID) | SUBCUTANEOUS | Status: DC

## 2012-12-16 NOTE — Progress Notes (Signed)
Patient and his wife given discharge instructions and they verbalized understanding using teach back method.  Patient currently with no pain and tolerated lunch without any nausea.  Patient stable for discharge home.  Allayne Butcher [facility]  12/16/2012

## 2012-12-16 NOTE — Discharge Summary (Signed)
Physician Discharge Summary  [name] ZOX:[id number] DOB: 07-24-1965 DOA: 12/14/2012  PCP: Brian Pizza, MD  Admit date: 12/14/2012 Discharge date: 12/16/2012  Recommendations for Outpatient Follow-up:  1. You were hospitalized for acute pancreatitis. Your lipase level has trended down from  about 900 to 165. We have slowly advanced her diet which she tolerated fine this morning  2. please avoid alcohol use as it may contribute to worsening pancreatitis.  3. use Norco as needed for pain control in case you still experience pain  4. your kidney function has remained stable and within normal limits throughout this hospital stay  5. Followup with your primary care physician in one to 2 weeks to make sure you're pancreatitis is getting better   Discharge Diagnoses:  Principal Problem:   Pancreatitis, acute Active Problems:   GOUT   Morbid obesity   HYPERTENSION   Hyponatremia   Alcohol abuse   Diabetic neuropathy   Diabetes mellitus type 2, uncontrolled, with complications    Discharge Condition: Medically stable for discharge home today  Diet recommendation: As tolerated, diabetic diet  History of present illness:  47 y.o. male with past medical history of uncontrolled diabetes, alcohol abuse, hypertension, and gout who presented to [facility] ED with worsening abdominal pain in the epigastric area radiating to his back for past 24 hours prior to this admission. Patient reported no associated fever.  In ED, vital signs were stable with blood pressure 115/72, heart rate 58 - 70 and T max 98.8 F. oxygen saturation was 97% on room air. Patient was found to have lipase level of 875 and CT abdomen was consistent with acute pancreatitis.   Assessment/Plan:   Principal Problem:  Pancreatitis, acute  - Perhaps related to history of alcohol abuse  - Abdominal pain has resolved. Patient does not require pain medications at this time but did ask for as needed Norco when he goes home in case  he does develop abdominal pain - Spoke with GI on call and recommendation was to treat this as acute pancreatitis and if no significant improvement to call for an official consultation; this patient has a significantly improved we will defer on official GI consultation for now Active Problems:  GOUT  -Continue allopurinol and colchicine  Diabetes mellitus type 2, uncontrolled, with complications  - complications of diabetic neuropathy  - A1c in 08/2012 7.4 indicating somewhat poor glycemic control  - May continue home insulin regimen - CBGs in past 24 hours: 159, 138, 126 Diabetic neuropathy  - continue gabapentin  Morbid obesity  - Diet is carb modified, advised to nutrition at bedside HYPERTENSION  - Blood pressure 135/74  - Continue home blood pressure medications Hyponatremia  - Likely dehydration  - Sodium ranges 130 - 134 - Improved with IV fluids Alcohol abuse  - ethanol level within normal limits on this admission  - CIWA ordered but pt did not have withdrawals   Code Status: full code  Family Communication: no family at the bedside     Manson Passey, MD  [facility]  Pager [phone]   Consultants:  None  Procedures:  None  Antibiotics:  None    Discharge Exam: Filed Vitals:   12/16/12 0635  BP: 135/74  Pulse: 66  Temp: 97.6 F (36.4 C)  Resp: 20   Filed Vitals:   12/15/12 1400 12/15/12 2025 12/15/12 2035 12/16/12 0635  BP: 143/89  140/88 135/74  Pulse: 64 64 68 66  Temp: 97.8 F (36.6 C)  98.6 F (37  C) 97.6 F (36.4 C)  TempSrc: Oral  Oral Oral  Resp: 18  20 20   Height:      Weight:      SpO2: 93%  95% 96%    General: Pt is alert, follows commands appropriately, not in acute distress Cardiovascular: Regular rate and rhythm, S1/S2 +, no murmurs, no rubs, no gallops Respiratory: Clear to auscultation bilaterally, no wheezing, no crackles, no rhonchi Abdominal: Soft, non tender, non distended, bowel sounds +, no guarding Extremities: no edema, no  cyanosis, pulses palpable bilaterally DP and PT Neuro: Grossly nonfocal  Discharge Instructions  Discharge Orders   Future Orders Complete By Expires   Call MD for:  difficulty breathing, headache or visual disturbances  As directed    Call MD for:  persistant dizziness or light-headedness  As directed    Call MD for:  persistant nausea and vomiting  As directed    Call MD for:  severe uncontrolled pain  As directed    Diet - low sodium heart healthy  As directed    Discharge instructions  As directed    Comments:     1. You were hospitalized for acute pancreatitis. Your lipase level has trended down from  about 900 to 165. We have slowly advanced her diet which she tolerated fine this morning 2. please avoid alcohol use as it may contribute to worsening pancreatitis. 3. use Norco as needed for pain control in case you still experience pain 4. your kidney function has remained stable and within normal limits throughout this hospital stay 5. Followup with your primary care physician in one to 2 weeks to make sure you're pancreatitis is getting better   Increase activity slowly  As directed        Medication List         allopurinol 300 MG tablet  Commonly known as:  ZYLOPRIM  Take 300 mg by mouth daily.     atenolol 100 MG tablet  Commonly known as:  TENORMIN  Take 100 mg by mouth daily.     CINNAMON PO  Take by mouth.     colchicine 0.6 MG tablet  Take 0.6 mg by mouth every Tuesday, Thursday, Saturday, and Sunday.     gabapentin 100 MG capsule  Commonly known as:  NEURONTIN  Take 1-2 capsules (100-200 mg total) by mouth at bedtime as needed. For burning sensation in feet.     HYDROcodone-acetaminophen 5-325 MG per tablet  Commonly known as:  NORCO  Take 1 tablet by mouth every 6 (six) hours as needed for pain.     insulin aspart 100 UNIT/ML injection  Commonly known as:  novoLOG  Inject 7 Units into the skin every evening.     insulin detemir 100 UNIT/ML injection   Commonly known as:  LEVEMIR  Inject 60 Units into the skin 2 (two) times daily.     MAGNESIUM-ZINC PO  Take 1 tablet by mouth daily.     omeprazole 40 MG capsule  Commonly known as:  PRILOSEC  Take 40 mg by mouth every Tuesday, Thursday, Saturday, and Sunday.     telmisartan-hydrochlorothiazide 80-12.5 MG per tablet  Commonly known as:  MICARDIS HCT  Take 1 tablet by mouth daily.           Follow-up Information   Follow up with Brian Pizza, MD In 2 weeks.   Specialty:  Internal Medicine   Contact information:   [place] [address]  The results of significant diagnostics from this hospitalization (including imaging, microbiology, ancillary and laboratory) are listed below for reference.    Significant Diagnostic Studies: Ct Abdomen Pelvis W Contrast  12/14/2012   *RADIOLOGY REPORT*  Clinical Data: Abdominal and back pain  CT ABDOMEN AND PELVIS WITH CONTRAST  Technique:  Multidetector CT imaging of the abdomen and pelvis was performed following the standard protocol during bolus administration of intravenous contrast.  Contrast: OMNIPAQUE IOHEXOL 300 MG/ML  SOLN  Comparison: Multiple priors, most recent 09/17/2012  Findings: Normal heart size.  Linear right lung opacities, favor atelectasis.  Diffuse hepatic steatosis.  Absent gallbladder.  No biliary ductal dilatation.  Unremarkable spleen and adrenal glands.  There is extensive stranding within the porta hepatis, abutting the second portion of the duodenum and the pancreatic head.  There are reactive size porta hepatis lymph nodes. Fluid tracks inferiorly along the anterior pararenal space on the right.  No loculated fluid collection.  No pancreatic ductal dilatation or areas of abnormal enhancement.  Symmetric renal enhancement.  No hydroureteronephrosis.  No CT evidence for colitis.  Colonic diverticulosis.  Abutting the sigmoid colon, there is a lobulated high fluid or soft tissue attenuation with peripheral  calcification, measuring 2.8 cm, similar to prior.  Other than the duodenum described above, small bowel loops are normal caliber proximally and decompressed distally. Multiple loops closely adherent to the abdominal wall suggest effusions.  There is evidence  of multiple prior bowel surgeries.  Appendix not definitively identified.  No right lower quadrant inflammation.  Thin-walled bladder.  Scattered atherosclerosis of the aorta and branch vessels without aneurysmal dilatation.  Multilevel degenerative changes.  No acute osseous finding.  IMPRESSION: Extensive peripancreatic/periduodenal fat stranding and ill-defined fluid.  Difficult at this point to delineate the process origin, with the differential including acute pancreatitis or acute duodenitis/ ulcerative disease. No free intraperitoneal air or abscess.  Correlate with clinical history, lipase, and consider EGD.  Hepatic steatosis.  Lobulated high fluid or soft tissue attenuation with peripheral calcification abutting the sigmoid colon, near the site of a colocolonic anastomosis. This is nonspecific, however, similar to prior.  Discussed via telephone with Sharen Hones at 09:50 p.m. on 12/14/2012   Original Report Authenticated By: Jearld Lesch, M.D.    Microbiology: No results found for this or any previous visit (from the past 240 hour(s)).   Labs: Basic Metabolic Panel:  Recent Labs Lab 12/14/12 2034 12/15/12 0355 12/16/12 0400  NA 130* 130* 134*  K 4.4 3.6 3.5  CL 89* 92* 98  CO2 29 29 27   GLUCOSE 304* 220* 140*  BUN 20 17 12   CREATININE 0.95 0.97 0.90  CALCIUM 10.7* 9.3 8.8   Liver Function Tests:  Recent Labs Lab 12/14/12 2034 12/15/12 0355 12/16/12 0400  AST 60* 41* 49*  ALT 66* 53 45  ALKPHOS 57 48 44  BILITOT 0.9 0.7 0.7  PROT 8.0 7.0 6.5  ALBUMIN 3.8 3.3* 2.9*    Recent Labs Lab 12/14/12 2034 12/15/12 0355 12/16/12 0400  LIPASE 875* 943* 165*   No results found for this basename: AMMONIA,  in the  last 168 hours CBC:  Recent Labs Lab 12/14/12 2034 12/15/12 0355 12/16/12 0400  WBC 8.3 7.8 6.7  NEUTROABS 5.8  --   --   HGB 14.4 13.0 12.5*  HCT 40.5 38.3* 37.3*  MCV 95.3 96.0 97.6  PLT 182 168 141*   Cardiac Enzymes: No results found for this basename: CKTOTAL, CKMB, CKMBINDEX, TROPONINI,  in the  last 168 hours BNP: BNP (last 3 results) No results found for this basename: PROBNP,  in the last 8760 hours CBG:  Recent Labs Lab 12/15/12 1610 12/15/12 2034 12/16/12 0028 12/16/12 0355 12/16/12 0743  GLUCAP 166* 149* 159* 138* 126*    Time coordinating discharge: Over 30 minutes  Signed:  Manson Passey, MD  [facility]  12/16/2012, 11:54 AM  Pager #: [phone]

## 2013-02-26 ENCOUNTER — Other Ambulatory Visit: Payer: Self-pay

## 2017-08-26 ENCOUNTER — Emergency Department (HOSPITAL_COMMUNITY): Payer: BLUE CROSS/BLUE SHIELD

## 2017-08-26 ENCOUNTER — Inpatient Hospital Stay (HOSPITAL_COMMUNITY)
Admission: EM | Admit: 2017-08-26 | Discharge: 2017-08-28 | DRG: 389 | Disposition: A | Discharge status: Home / Self Care | Payer: BLUE CROSS/BLUE SHIELD | Attending: Internal Medicine | Admitting: Internal Medicine

## 2017-08-26 ENCOUNTER — Other Ambulatory Visit: Payer: Self-pay

## 2017-08-26 ENCOUNTER — Encounter (HOSPITAL_COMMUNITY): Payer: Self-pay

## 2017-08-26 DIAGNOSIS — Z885 Allergy status to narcotic agent status: Secondary | ICD-10-CM | POA: Diagnosis not present

## 2017-08-26 DIAGNOSIS — Z79899 Other long term (current) drug therapy: Secondary | ICD-10-CM | POA: Diagnosis not present

## 2017-08-26 DIAGNOSIS — K56609 Unspecified intestinal obstruction, unspecified as to partial versus complete obstruction: Secondary | ICD-10-CM

## 2017-08-26 DIAGNOSIS — E785 Hyperlipidemia, unspecified: Secondary | ICD-10-CM | POA: Diagnosis present

## 2017-08-26 DIAGNOSIS — M109 Gout, unspecified: Secondary | ICD-10-CM | POA: Diagnosis present

## 2017-08-26 DIAGNOSIS — IMO0002 Reserved for concepts with insufficient information to code with codable children: Secondary | ICD-10-CM | POA: Diagnosis present

## 2017-08-26 DIAGNOSIS — E1165 Type 2 diabetes mellitus with hyperglycemia: Secondary | ICD-10-CM | POA: Diagnosis not present

## 2017-08-26 DIAGNOSIS — I10 Essential (primary) hypertension: Secondary | ICD-10-CM | POA: Diagnosis present

## 2017-08-26 DIAGNOSIS — K56600 Partial intestinal obstruction, unspecified as to cause: Secondary | ICD-10-CM | POA: Diagnosis not present

## 2017-08-26 DIAGNOSIS — F1010 Alcohol abuse, uncomplicated: Secondary | ICD-10-CM | POA: Diagnosis not present

## 2017-08-26 DIAGNOSIS — I480 Paroxysmal atrial fibrillation: Secondary | ICD-10-CM | POA: Diagnosis present

## 2017-08-26 DIAGNOSIS — Z794 Long term (current) use of insulin: Secondary | ICD-10-CM | POA: Diagnosis not present

## 2017-08-26 DIAGNOSIS — Z6841 Body Mass Index (BMI) 40.0 and over, adult: Secondary | ICD-10-CM | POA: Diagnosis not present

## 2017-08-26 DIAGNOSIS — E1140 Type 2 diabetes mellitus with diabetic neuropathy, unspecified: Secondary | ICD-10-CM | POA: Diagnosis present

## 2017-08-26 DIAGNOSIS — K429 Umbilical hernia without obstruction or gangrene: Secondary | ICD-10-CM | POA: Diagnosis present

## 2017-08-26 DIAGNOSIS — K219 Gastro-esophageal reflux disease without esophagitis: Secondary | ICD-10-CM | POA: Diagnosis not present

## 2017-08-26 DIAGNOSIS — Z720 Tobacco use: Secondary | ICD-10-CM

## 2017-08-26 DIAGNOSIS — Z87891 Personal history of nicotine dependence: Secondary | ICD-10-CM

## 2017-08-26 DIAGNOSIS — E118 Type 2 diabetes mellitus with unspecified complications: Secondary | ICD-10-CM

## 2017-08-26 HISTORY — DX: Disorder of kidney and ureter, unspecified: N28.9

## 2017-08-26 LAB — COMPREHENSIVE METABOLIC PANEL
ALT: 22 U/L (ref 17–63)
ANION GAP: 11 (ref 5–15)
AST: 25 U/L (ref 15–41)
Albumin: 3.5 g/dL (ref 3.5–5.0)
Alkaline Phosphatase: 54 U/L (ref 38–126)
BUN: 15 mg/dL (ref 6–20)
CALCIUM: 8.8 mg/dL — AB (ref 8.9–10.3)
CHLORIDE: 106 mmol/L (ref 101–111)
CO2: 21 mmol/L — AB (ref 22–32)
Creatinine, Ser: 0.95 mg/dL (ref 0.61–1.24)
GFR calc Af Amer: >60 mL/min (ref >60)
GFR calc non Af Amer: >60 mL/min (ref >60)
Glucose, Bld: 156 mg/dL — ABNORMAL HIGH (ref 65–99)
POTASSIUM: 4.2 mmol/L (ref 3.5–5.1)
SODIUM: 138 mmol/L (ref 135–145)
Total Bilirubin: 0.8 mg/dL (ref 0.3–1.2)
Total Protein: 7.2 g/dL (ref 6.5–8.1)

## 2017-08-26 LAB — URINALYSIS, ROUTINE W REFLEX MICROSCOPIC
BACTERIA UA: NONE SEEN
Cellular Cast, UA: 15
GLUCOSE, UA: NEGATIVE mg/dL
HGB URINE DIPSTICK: NEGATIVE
Ketones, ur: 5 mg/dL — AB
Leukocytes, UA: NEGATIVE
Nitrite: NEGATIVE
Specific Gravity, Urine: >1.046 — ABNORMAL HIGH (ref 1.005–1.030)
pH: 5 (ref 5.0–8.0)

## 2017-08-26 LAB — CBC
HEMATOCRIT: 47.7 % (ref 39.0–52.0)
HEMOGLOBIN: 16.5 g/dL (ref 13.0–17.0)
MCH: 34.7 pg — AB (ref 26.0–34.0)
MCHC: 34.6 g/dL (ref 30.0–36.0)
MCV: 100.4 fL — ABNORMAL HIGH (ref 78.0–100.0)
Platelets: 176 10*3/uL (ref 150–400)
RBC: 4.75 MIL/uL (ref 4.22–5.81)
RDW: 12.8 % (ref 11.5–15.5)
WBC: 7.7 10*3/uL (ref 4.0–10.5)

## 2017-08-26 LAB — PROTIME-INR
INR: 1.08
Prothrombin Time: 13.9 seconds (ref 11.4–15.2)

## 2017-08-26 LAB — TYPE AND SCREEN
ABO/RH(D): O POS
ANTIBODY SCREEN: NEGATIVE

## 2017-08-26 LAB — LIPASE, BLOOD: LIPASE: 19 U/L (ref 11–51)

## 2017-08-26 LAB — GLUCOSE, CAPILLARY: GLUCOSE-CAPILLARY: 101 mg/dL — AB (ref 65–99)

## 2017-08-26 LAB — APTT: aPTT: 28 seconds (ref 24–36)

## 2017-08-26 MED ORDER — LORAZEPAM 2 MG/ML IJ SOLN: 0.0000 mg | Freq: Four times a day (QID) | INTRAMUSCULAR | Status: DC

## 2017-08-26 MED ORDER — LORAZEPAM 1 MG PO TABS: 1.0000 mg | ORAL_TABLET | Freq: Four times a day (QID) | ORAL | Status: DC | PRN

## 2017-08-26 MED ORDER — HYDROMORPHONE HCL 1 MG/ML IJ SOLN
1.0000 mg | INTRAMUSCULAR | Status: DC | PRN
  Administered 2017-08-26 – 2017-08-28 (×6): 1 mg via INTRAVENOUS
  Filled 2017-08-26 (×6): qty 1

## 2017-08-26 MED ORDER — SODIUM CHLORIDE 0.9 % IV BOLUS
1000.0000 mL | Freq: Once | INTRAVENOUS | Status: AC
  Administered 2017-08-26: 1000 mL via INTRAVENOUS

## 2017-08-26 MED ORDER — ADULT MULTIVITAMIN W/MINERALS CH
1.0000 | ORAL_TABLET | Freq: Every day | ORAL | Status: DC
  Administered 2017-08-26 – 2017-08-28 (×3): 1 via ORAL
  Filled 2017-08-26 (×3): qty 1

## 2017-08-26 MED ORDER — FOLIC ACID 1 MG PO TABS
1.0000 mg | ORAL_TABLET | Freq: Every day | ORAL | Status: DC
  Administered 2017-08-26 – 2017-08-28 (×3): 1 mg via ORAL
  Filled 2017-08-26 (×3): qty 1

## 2017-08-26 MED ORDER — PRAVASTATIN SODIUM 20 MG PO TABS
20.0000 mg | ORAL_TABLET | Freq: Every day | ORAL | Status: DC
  Administered 2017-08-27: 20 mg via ORAL
  Filled 2017-08-26: qty 1

## 2017-08-26 MED ORDER — INSULIN GLARGINE 100 UNIT/ML ~~LOC~~ SOLN
75.0000 [IU] | Freq: Every day | SUBCUTANEOUS | Status: DC
  Filled 2017-08-26: qty 0.75

## 2017-08-26 MED ORDER — IOPAMIDOL (ISOVUE-300) INJECTION 61%
125.0000 mL | Freq: Once | INTRAVENOUS | Status: AC | PRN
  Administered 2017-08-26: 125 mL via INTRAVENOUS

## 2017-08-26 MED ORDER — FAMOTIDINE IN NACL 20-0.9 MG/50ML-% IV SOLN
20.0000 mg | Freq: Two times a day (BID) | INTRAVENOUS | Status: DC
  Administered 2017-08-26 – 2017-08-28 (×4): 20 mg via INTRAVENOUS
  Filled 2017-08-26 (×4): qty 50

## 2017-08-26 MED ORDER — LORAZEPAM 2 MG/ML IJ SOLN: 0.0000 mg | Freq: Two times a day (BID) | INTRAMUSCULAR | Status: DC

## 2017-08-26 MED ORDER — CALCIUM-MAGNESIUM-ZINC-D3 PO TABS: 1.0000 | ORAL_TABLET | Freq: Every day | ORAL | Status: DC

## 2017-08-26 MED ORDER — SODIUM CHLORIDE 0.9 % IV SOLN
INTRAVENOUS | Status: DC
  Administered 2017-08-26 – 2017-08-27 (×4): via INTRAVENOUS

## 2017-08-26 MED ORDER — HYDROMORPHONE HCL 1 MG/ML IJ SOLN
1.0000 mg | Freq: Once | INTRAMUSCULAR | Status: AC
  Administered 2017-08-26: 1 mg via INTRAVENOUS
  Filled 2017-08-26: qty 1

## 2017-08-26 MED ORDER — ATENOLOL 100 MG PO TABS
100.0000 mg | ORAL_TABLET | Freq: Every day | ORAL | Status: DC
  Administered 2017-08-27 – 2017-08-28 (×2): 100 mg via ORAL
  Filled 2017-08-26: qty 2
  Filled 2017-08-26: qty 1
  Filled 2017-08-26: qty 2
  Filled 2017-08-26: qty 1

## 2017-08-26 MED ORDER — ALLOPURINOL 300 MG PO TABS
300.0000 mg | ORAL_TABLET | Freq: Every day | ORAL | Status: DC
  Administered 2017-08-27 – 2017-08-28 (×2): 300 mg via ORAL
  Filled 2017-08-26 (×2): qty 1

## 2017-08-26 MED ORDER — LISINOPRIL 5 MG PO TABS
5.0000 mg | ORAL_TABLET | Freq: Every day | ORAL | Status: DC
  Administered 2017-08-27 – 2017-08-28 (×2): 5 mg via ORAL
  Filled 2017-08-26 (×2): qty 1

## 2017-08-26 MED ORDER — HYDRALAZINE HCL 20 MG/ML IJ SOLN: 5.0000 mg | INTRAMUSCULAR | Status: DC | PRN

## 2017-08-26 MED ORDER — VITAMIN B-1 100 MG PO TABS
100.0000 mg | ORAL_TABLET | Freq: Every day | ORAL | Status: DC
  Administered 2017-08-26 – 2017-08-28 (×3): 100 mg via ORAL
  Filled 2017-08-26 (×3): qty 1

## 2017-08-26 MED ORDER — INSULIN ASPART 100 UNIT/ML ~~LOC~~ SOLN: 0.0000 [IU] | Freq: Three times a day (TID) | SUBCUTANEOUS | Status: DC

## 2017-08-26 MED ORDER — ACETAMINOPHEN 325 MG PO TABS: 650.0000 mg | ORAL_TABLET | Freq: Four times a day (QID) | ORAL | Status: DC | PRN

## 2017-08-26 MED ORDER — ZOLPIDEM TARTRATE 5 MG PO TABS
5.0000 mg | ORAL_TABLET | Freq: Every evening | ORAL | Status: DC | PRN
  Administered 2017-08-26: 5 mg via ORAL
  Filled 2017-08-26: qty 1

## 2017-08-26 MED ORDER — LORAZEPAM 2 MG/ML IJ SOLN: 1.0000 mg | Freq: Four times a day (QID) | INTRAMUSCULAR | Status: DC | PRN

## 2017-08-26 MED ORDER — ACETAMINOPHEN 650 MG RE SUPP: 650.0000 mg | Freq: Four times a day (QID) | RECTAL | Status: DC | PRN

## 2017-08-26 MED ORDER — ONDANSETRON HCL 4 MG/2ML IJ SOLN
4.0000 mg | Freq: Three times a day (TID) | INTRAMUSCULAR | Status: DC | PRN
Start: 2017-08-26 — End: 2017-08-28

## 2017-08-26 MED ORDER — MELOXICAM 15 MG PO TABS
15.0000 mg | ORAL_TABLET | Freq: Every day | ORAL | Status: DC
  Administered 2017-08-27 – 2017-08-28 (×2): 15 mg via ORAL
  Filled 2017-08-26 (×2): qty 1

## 2017-08-26 MED ORDER — IOPAMIDOL (ISOVUE-300) INJECTION 61%
INTRAVENOUS | Status: AC
Start: 2017-08-26 — End: 2017-08-27
  Filled 2017-08-26: qty 150

## 2017-08-26 MED ORDER — THIAMINE HCL 100 MG/ML IJ SOLN: 100.0000 mg | Freq: Every day | INTRAMUSCULAR | Status: DC

## 2017-08-26 MED ORDER — ONDANSETRON HCL 4 MG/2ML IJ SOLN
4.0000 mg | Freq: Once | INTRAMUSCULAR | Status: DC
  Filled 2017-08-26: qty 2

## 2017-08-26 NOTE — ED Notes (Signed)
ED Provider at bedside. 

## 2017-08-26 NOTE — ED Triage Notes (Signed)
Patient c/o mid abdominal pain and diarrhea x 2 days.

## 2017-08-26 NOTE — H&P (Signed)
History and Physical    [name]:[id number] DOB: [birth year] DOA: 08/26/2017  Referring MD/NP/PA:   PCP: Benita Stabile, MD   Patient coming from:  The patient is coming from home.  At baseline, pt is independent for most of ADL.   Chief Complaint: Abdominal pain  HPI: [name] is a 52 y.o. male with medical history significant of multiple abdominal surgery, hypertension, hyperlipidemia, diabetes mellitus, GERD, gout, diverticulitis, alcoholic hepatitis, alcohol abuse, obesity, who presents with abdominal pain.  Patient states that he has been having intermittent abdominal pain since Friday.  It is located in the central abdomen, sharp, nonradiating, can reach up 10 out of 10 in severity.  Patient denies nausea vomiting.  He had several episode of loose stool bowel movement. He had one watery bowel movement today.Patient does not have chest pain, shortness breath, cough, fever, chills, symptoms of UTI or unilateral weakness.  ED Course: pt was found to have WBC 7.7, lipase 19, negative urinalysis, electrolytes renal function okay, temperature normal, no tachycardia, no tachypnea, oxygen saturation 94% on room air.  CT abdomen/pelvis showed partial small bowel obstruction with transition point at lower central abdomen.  Patient is admitted to telemetry bed as inpatient.  General surgeon will be consulted by ED physician.  Review of Systems:   General: no fevers, chills, no body weight gain, has poor appetite, has fatigue HEENT: no blurry vision, hearing changes or sore throat Respiratory: no dyspnea, coughing, wheezing CV: no chest pain, no palpitations GI: no nausea, vomiting, has abdominal pain, diarrhea, no constipation GU: no dysuria, burning on urination, increased urinary frequency, hematuria  Ext: no leg edema Neuro: no unilateral weakness, numbness, or tingling, no vision change or hearing loss Skin: no rash, no skin tear. MSK: No muscle spasm, no deformity, no  limitation of range of movement in spin Heme: No easy bruising.  Travel history: No recent long distant travel.  Allergy:  Allergies  Allergen Reactions  . Hydrocodone Itching    Past Medical History:  Diagnosis Date  . Diabetes mellitus without complication (HCC)   . Diabetic neuropathy (HCC) 03/25/2012  . Diverticulitis   . DM type 2 (diabetes mellitus, type 2) (HCC) 03/24/2012  . Hyperlipidemia 03/24/2012  . Hypertension   . Incisional hernia without mention of obstruction or gangrene 02/07/2011  . Pancreatitis   . Paroxysmal atrial fibrillation (HCC) 03/25/2012   Transient, occurred off of atenolol.  . Renal disorder     Past Surgical History:  Procedure Laterality Date  . APPENDECTOMY  04/15/2009  . COLON SURGERY  04/15/2009  . HERNIA REPAIR  01/25/11   lap ventral hernia repair x9  . LAPAROSCOPIC CHOLECYSTECTOMY    . LAPAROSCOPIC LYSIS OF ADHESIONS N/A 09/17/2012   Procedure: LAPAROSCOPIC and Open  LYSIS OF ADHESIONS;  Surgeon: Ardeth Sportsman, MD;  Location: [facility];  Service: General;  Laterality: N/A;  . VENTRAL HERNIA REPAIR N/A 09/17/2012   Procedure: LAPAROSCOPIC VENTRAL HERNIA;  Surgeon: Ardeth Sportsman, MD;  Location: [facility];  Service: General;  Laterality: N/A;    Social History:  reports that he has quit smoking. His smoking use included cigarettes. He has a 30.00 pack-year smoking history. He has never used smokeless tobacco. He reports that he drinks alcohol. He reports that he does not use drugs.  Family History:  Family History  Problem Relation Age of Onset  . Hypertension Mother   . Hypertension Father      Prior to Admission medications  Medication Sig Start Date End Date Taking? Authorizing Provider  acetaminophen (TYLENOL) 500 MG tablet Take 1,000 mg by mouth daily.   Yes [provider]  allopurinol (ZYLOPRIM) 300 MG tablet Take 300 mg by mouth daily.  01/21/11  Yes [provider]  atenolol (TENORMIN) 100 MG tablet Take 100 mg by  mouth daily.   Yes [provider]  lisinopril (PRINIVIL,ZESTRIL) 5 MG tablet Take 5 mg by mouth daily.   Yes [provider]  meloxicam (MOBIC) 15 MG tablet Take 15 mg by mouth daily.   Yes [provider]  Multiple Minerals-Vitamins (CALCIUM-MAGNESIUM-ZINC-D3 PO) Take 1 tablet by mouth daily.   Yes [provider]  omeprazole (PRILOSEC) 20 MG capsule Take 20 mg by mouth daily.   Yes [provider]  pravastatin (PRAVACHOL) 20 MG tablet Take 20 mg by mouth daily.   Yes [provider]  TOUJEO MAX SOLOSTAR 300 UNIT/ML SOPN Inject 110 Units into the skin daily. 08/18/17  Yes [provider]  gabapentin (NEURONTIN) 100 MG capsule Take 1-2 capsules (100-200 mg total) by mouth at bedtime as needed. For burning sensation in feet. Patient not taking: Reported on 08/26/2017 03/25/12   Elliot Cousin, MD  HYDROcodone-acetaminophen [facility]) 5-325 MG per tablet Take 1 tablet by mouth every 6 (six) hours as needed for pain. Patient not taking: Reported on 08/26/2017 12/16/12   Alison Murray, MD  insulin aspart (NOVOLOG) 100 UNIT/ML injection Inject 7 Units into the skin every evening.    [provider]  insulin detemir (LEVEMIR) 100 UNIT/ML injection Inject 60 Units into the skin 2 (two) times daily.     [provider]    Physical Exam: Vitals:   08/26/17 1644 08/26/17 1830 08/26/17 1930 08/26/17 1938  BP: (!) 149/104 (!) 148/91 (!) 151/90 (!) 151/90  Pulse: 84 68 81 80  Resp: Temp:      TempSrc:      SpO2: 98% 94% 96%   Weight:      Height:       General: Not in acute distress HEENT:       Eyes: PERRL, EOMI, no scleral icterus.       ENT: No discharge from the ears and nose, no pharynx injection, no tonsillar enlargement.        Neck: No JVD, no bruit, no mass felt. Heme: No neck lymph node enlargement. Cardiac: S1/S2, RRR, No murmurs, No gallops or rubs. Respiratory:  No rales, wheezing, rhonchi or  rubs. GI: Soft, mildly distended, tenderness in central abdomen, a big surgical scar in central abdomen, no rebound pain, no organomegaly, BS present. GU: No hematuria Ext: No pitting leg edema bilaterally. 2+DP/PT pulse bilaterally. Musculoskeletal: No joint deformities, No joint redness or warmth, no limitation of ROM in spin. Skin: No rashes.  Neuro: Alert, oriented X3, cranial nerves II-XII grossly intact, moves all extremities normally. Psych: Patient is not psychotic, no suicidal or hemocidal ideation.  Labs on Admission: I have personally reviewed following labs and imaging studies  CBC: Recent Labs  Lab 08/26/17 1136  WBC 7.7  HGB 16.5  HCT 47.7  MCV 100.4*  PLT 176   Basic Metabolic Panel: Recent Labs  Lab 08/26/17 1136  NA 138  K 4.2  CL 106  CO2 21*  GLUCOSE 156*  BUN 15  CREATININE 0.95  CALCIUM 8.8*   GFR: Estimated Creatinine Clearance: 125.2 mL/min (by C-G formula based on SCr of 0.95 mg/dL). Liver Function  Tests: Recent Labs  Lab 08/26/17 1136  AST 25  ALT 22  ALKPHOS 54  BILITOT 0.8  PROT 7.2  ALBUMIN 3.5   Recent Labs  Lab 08/26/17 1136  LIPASE 19   No results for input(s): AMMONIA in the last 168 hours. Coagulation Profile: Recent Labs  Lab 08/26/17 2005  INR 1.08   Cardiac Enzymes: No results for input(s): CKTOTAL, CKMB, CKMBINDEX, TROPONINI in the last 168 hours. BNP (last 3 results) No results for input(s): PROBNP in the last 8760 hours. HbA1C: No results for input(s): HGBA1C in the last 72 hours. CBG: No results for input(s): GLUCAP in the last 168 hours. Lipid Profile: No results for input(s): CHOL, HDL, LDLCALC, TRIG, CHOLHDL, LDLDIRECT in the last 72 hours. Thyroid Function Tests: No results for input(s): TSH, T4TOTAL, FREET4, T3FREE, THYROIDAB in the last 72 hours. Anemia Panel: No results for input(s): VITAMINB12, FOLATE, FERRITIN, TIBC, IRON, RETICCTPCT in the last 72 hours. Urine analysis:    Component Value  Date/Time   COLORURINE AMBER (A) 08/26/2017 1454   APPEARANCEUR HAZY (A) 08/26/2017 1454   LABSPEC >1.046 (H) 08/26/2017 1454   PHURINE 5.0 08/26/2017 1454   GLUCOSEU NEGATIVE 08/26/2017 1454   HGBUR NEGATIVE 08/26/2017 1454   BILIRUBINUR SMALL (A) 08/26/2017 1454   KETONESUR 5 (A) 08/26/2017 1454   PROTEINUR >=300 (A) 08/26/2017 1454   UROBILINOGEN 0.2 12/14/2012 2154   NITRITE NEGATIVE 08/26/2017 1454   LEUKOCYTESUR NEGATIVE 08/26/2017 1454   Sepsis Labs: (procalcitonin:4,lacticidven:4) )No results found for this or any previous visit (from the past 240 hour(s)).   Radiological Exams on Admission: Ct Abdomen Pelvis W Contrast  Result Date: 08/26/2017 CLINICAL DATA:  Patient c/o mid abdominal pain and diarrhea x 2 days; hx multiple abd surgeries: ileostomy and reversal for diverticulitis EXAM: CT ABDOMEN AND PELVIS WITH CONTRAST TECHNIQUE: Multidetector CT imaging of the abdomen and pelvis was performed using the standard protocol following bolus administration of intravenous contrast. CONTRAST:  ISOVUE-300 IOPAMIDOL (ISOVUE-300) INJECTION 61% COMPARISON:  12/14/2012 FINDINGS: Lower chest: No acute abnormality. Hepatobiliary: Liver demonstrates decreased attenuation consistent with fatty infiltration. No liver mass or focal lesion. Gallbladder surgically absent. No bile duct dilation. Pancreas: Unremarkable. No pancreatic ductal dilatation or surrounding inflammatory changes. Spleen: Normal in size without focal abnormality. Adrenals/Urinary Tract: Adrenal glands are unremarkable. Kidneys are normal, without renal calculi, focal lesion, or hydronephrosis. Bladder is unremarkable. Stomach/Bowel: There is mild dilation of the proximal to mid small bowel with a transition point in the central abdomen. Just to the left of the umbilicus, there is a knuckle of small bowel that slightly herniates through the anterior abdominal wall. Short portion of the small bowel distal to this is  decompressed. The loops thin becomes mildly dilated where it joins an anastomosis staple line, in the central abdomen just below the level of the umbilicus. The bowel distal to the anastomosis staple line is mostly decompressed. There is no small bowel wall thickening or adjacent inflammation. Stomach is unremarkable. There is a bowel anastomosis staple line along the mid sigmoid colon. There are scattered diverticula along the colon superior to this. No diverticulitis or other colonic inflammatory process. Vascular/Lymphatic: Minor aortic atherosclerosis. Atherosclerotic plaque noted in the iliac arteries. No aneurysm. No pathologically enlarged lymph nodes. Reproductive: Unremarkable. Other: No ascites. Musculoskeletal: No fracture or acute finding. No osteoblastic or osteolytic lesions. IMPRESSION: 1. Partial small bowel obstruction. The transition point is in the low central abdomen just below the umbilicus. There are 2 adjacent areas of narrowing,  1 involving a small ventral hernia just lateral to the umbilicus and the other just below this, at the efferent side of a small bowel anastomosis. 2. No evidence of bowel inflammation or ischemia. 3. No other acute abnormalities. 4. Hepatic steatosis. 5. Minor aortic atherosclerosis. Electronically Signed   By: Amie Portland M.D.   On: 08/26/2017 17:32     EKG:  Not done in ED, will get one.   Assessment/Plan Principal Problem:   Partial small bowel obstruction (HCC) Active Problems:   GOUT   Morbid obesity (HCC)   Essential hypertension   Alcohol abuse   Diabetes mellitus type 2, uncontrolled, with complications (HCC)   GERD (gastroesophageal reflux disease)   Partial small bowel obstruction (HCC): As evidenced by the CT abdomen/pelvis.  Patient does not have leukocytosis or fever.  Not septic clinically.  Patient is hemodynamically stable.  Patient does not have active nausea, vomiting currently. General surgeon will be consulted by ED physician.     -Admit to tele bed for osb -NPO   -prn NG tube  -morphine prn pain -Prn Zofran prn nausea   -IVF: 1L NS, and then 125 cc/h -INR/PTT/type & screen -Follow-up general surgeons recommendation  Gout: -continue home allopurinol  HTN:  -Continue home medications: Lisinopril, atenolol -IV hydralazine prn  Diabetes mellitus type 2, uncontrolled, with complications (HCC): Last A1c 7.8 on 09/18/12, poorly controled. Patient is taking Joujeo and novolog at home -will decrease Joujeo-->glargin insulin dose from  110 to 75 U daily -SSI  GERD (gastroesophageal reflux disease): -switched PPI to IV Pepcid night   Alcohol abuse: Last drink was on Friday.  No signs of withdrawal currently. -CiWA  DVT ppx: SCS Code Status: Full code Family Communication: Yes, patient's wife at bed side Disposition Plan:  Anticipate discharge back to previous home environment Consults called: General surgeon will be consulted by ED physician Admission status:   Inpatient/tele     Date of Service 08/26/2017    Lorretta Harp [facility] Pager [phone]  If 7PM-7AM, please contact night-coverage [email or web address] Password [facility] 08/26/2017, 8:38 PM

## 2017-08-26 NOTE — Consult Note (Signed)
Chief Complaint:  Abdominal pain and cramping  History of Present Illness:  [name] is an 52 y.o. male patient who is been operated on by Dr. gross on several occasions with indications for temporary ostomy with takedown and multiple hernia repairs with mesh.  He presented to the emergency room today approximately 1030 and was worked up all day with this cramping mid mid abdominal pain is been present for several weeks.  He times feels like he is going to swell and pop.  He has not had any nausea or vomiting.  CT scan was obtained which showed a partial small bowel obstruction and looks to me like a small recurrent hernia slightly to the left of midline again partially obstructing small bowel.  He is been seen by the hospitalist service and is being admitted for observation.  CCS will follow up and it may be that he will get involved with Dr. Johney Maine yet again for repair of this recurrent hernia.  Past Medical History:  Diagnosis Date  . Diabetes mellitus without complication ([place])   . Diabetic neuropathy ([place]) 03/25/2012  . Diverticulitis   . DM type 2 (diabetes mellitus, type 2) ([place]) 03/24/2012  . Hyperlipidemia 03/24/2012  . Hypertension   . Incisional hernia without mention of obstruction or gangrene 02/07/2011  . Pancreatitis   . Paroxysmal atrial fibrillation ([place]) 03/25/2012   Transient, occurred off of atenolol.  . Renal disorder     Past Surgical History:  Procedure Laterality Date  . APPENDECTOMY  04/15/2009  . COLON SURGERY  04/15/2009  . HERNIA REPAIR  01/25/11   lap ventral hernia repair x9  . LAPAROSCOPIC CHOLECYSTECTOMY    . LAPAROSCOPIC LYSIS OF ADHESIONS N/A 09/17/2012   Procedure: LAPAROSCOPIC and Open  LYSIS OF ADHESIONS;  Surgeon: Adin Hector, MD;  Location: [facility];  Service: General;  Laterality: N/A;  . VENTRAL HERNIA REPAIR N/A 09/17/2012   Procedure: LAPAROSCOPIC VENTRAL HERNIA;  Surgeon: Adin Hector, MD;  Location: [facility];  Service: General;  Laterality:  N/A;    Current Facility-Administered Medications  Medication Dose Route Frequency Provider Last Rate Last Dose  . 0.9 %  sodium chloride infusion   Intravenous Continuous Ivor Costa, MD 125 mL/hr at 08/26/17 1935    . acetaminophen (TYLENOL) tablet 650 mg  650 mg Oral Q6H PRN Ivor Costa, MD       Or  . acetaminophen (TYLENOL) suppository 650 mg  650 mg Rectal Q6H PRN Ivor Costa, MD      . Derrill Memo ON 08/27/2017] allopurinol (ZYLOPRIM) tablet 300 mg  300 mg Oral Daily Ivor Costa, MD      . Derrill Memo ON 08/27/2017] atenolol (TENORMIN) tablet 100 mg  100 mg Oral Daily Ivor Costa, MD      . famotidine (PEPCID) IVPB 20 mg premix  20 mg Intravenous Q12H Ivor Costa, MD      . folic acid (FOLVITE) tablet 1 mg  1 mg Oral Daily Ivor Costa, MD   1 mg at 08/26/17 2030  . hydrALAZINE (APRESOLINE) injection 5 mg  5 mg Intravenous Q2H PRN Ivor Costa, MD      . HYDROmorphone (DILAUDID) injection 1 mg  1 mg Intravenous Q4H PRN Ivor Costa, MD   1 mg at 08/26/17 1936  . [START ON 08/27/2017] insulin aspart (novoLOG) injection 0-9 Units  0-9 Units Subcutaneous TID WC Ivor Costa, MD      . Insulin Glargine SOPN 75 Units  75 Units Subcutaneous Daily Ivor Costa, MD      .  iopamidol (ISOVUE-300) 61 % injection           . [START ON 08/27/2017] lisinopril (PRINIVIL,ZESTRIL) tablet 5 mg  5 mg Oral Daily Ivor Costa, MD      . LORazepam (ATIVAN) injection 0-4 mg  0-4 mg Intravenous Q6H Ivor Costa, MD       Followed by  . [START ON 08/28/2017] LORazepam (ATIVAN) injection 0-4 mg  0-4 mg Intravenous Q12H Ivor Costa, MD      . LORazepam (ATIVAN) tablet 1 mg  1 mg Oral Q6H PRN Ivor Costa, MD       Or  . LORazepam (ATIVAN) injection 1 mg  1 mg Intravenous Q6H PRN Ivor Costa, MD      . Derrill Memo ON 08/27/2017] meloxicam (MOBIC) tablet 15 mg  15 mg Oral Daily Ivor Costa, MD      . multivitamin with minerals tablet 1 tablet  1 tablet Oral Daily Ivor Costa, MD   1 tablet at 08/26/17 2030  . ondansetron (ZOFRAN) injection 4 mg  4 mg Intravenous  Once Ivor Costa, MD      . ondansetron [facility]) injection 4 mg  4 mg Intravenous Q8H PRN Ivor Costa, MD      . Derrill Memo ON 08/27/2017] pravastatin (PRAVACHOL) tablet 20 mg  20 mg Oral q1800 Ivor Costa, MD      . thiamine (VITAMIN B-1) tablet 100 mg  100 mg Oral Daily Ivor Costa, MD   100 mg at 08/26/17 2030   Or  . thiamine (B-1) injection 100 mg  100 mg Intravenous Daily Ivor Costa, MD      . zolpidem (AMBIEN) tablet 5 mg  5 mg Oral QHS PRN Ivor Costa, MD       Hydrocodone Family History  Problem Relation Age of Onset  . Hypertension Mother   . Hypertension Father    Social History:   reports that he has quit smoking. His smoking use included cigarettes. He has a 30.00 pack-year smoking history. He has never used smokeless tobacco. He reports that he drinks alcohol. He reports that he does not use drugs.   REVIEW OF SYSTEMS : Negative except see old chart for multiple admissions with multiple surgeries.  Physical Exam:   Blood pressure (!) 144/96, pulse 88, temperature 97.9 F (36.6 C), temperature source Oral, resp. rate 18, height _0  (1.803 m), weight 134.9 kg (297 lb 6.4 oz), SpO2 97 %. Body mass index is 41.48 kg/m.  Gen:  WDWN WM NAD  Neurological: Alert and oriented to person, place, and time. Motor and sensory function is grossly intact  Head: Normocephalic and atraumatic.  Eyes: Conjunctivae are normal. Pupils are equal, round, and reactive to light. No scleral icterus.  Neck: Normal range of motion. Neck supple. No tracheal deviation or thyromegaly present.  Cardiovascular:  SR without murmurs or gallops.  No carotid bruits Breast:  Not examined Respiratory: Effort normal.  No respiratory distress. No chest wall tenderness. Breath sounds normal.  No wheezes, rales or rhonchi.  Abdomen:  Multiple scars from healing by secondary intention.  There is a slightly tender area to the left of the midline in the umbilical region GU:  No pathology noted Musculoskeletal: Normal  range of motion. Extremities are nontender. No cyanosis, edema or clubbing noted Lymphadenopathy: No cervical, preauricular, postauricular or axillary adenopathy is present Skin: Skin is warm and dry. No rash noted. No diaphoresis. No erythema. No pallor. Pscyh: Normal mood and affect. Behavior is normal. Judgment and thought content normal.  His wife is present and she assists in the history presentation.   LABORATORY RESULTS: Results for orders placed or performed during the hospital encounter of 08/26/17 (from the past 48 hour(s))  Lipase, blood     Status: None   Collection Time: 08/26/17 11:36 AM  Result Value Ref Range   Lipase 19 11 - 51 U/L    Comment: Performed at [facility], [place] [address], [place], [place] [address]  Comprehensive metabolic panel     Status: Abnormal   Collection Time: 08/26/17 11:36 AM  Result Value Ref Range   Sodium 138 135 - 145 mmol/L   Potassium 4.2 3.5 - 5.1 mmol/L   Chloride 106 101 - 111 mmol/L   CO2 21 (L) 22 - 32 mmol/L   Glucose, Bld 156 (H) 65 - 99 mg/dL   BUN 15 6 - 20 mg/dL   Creatinine, Ser 0.95 0.61 - 1.24 mg/dL   Calcium 8.8 (L) 8.9 - 10.3 mg/dL   Total Protein 7.2 6.5 - 8.1 g/dL   Albumin 3.5 3.5 - 5.0 g/dL   AST 25 15 - 41 U/L   ALT 22 17 - 63 U/L   Alkaline Phosphatase 54 38 - 126 U/L   Total Bilirubin 0.8 0.3 - 1.2 mg/dL   GFR calc non Af Amer >60 >60 mL/min   GFR calc Af Amer >60 >60 mL/min    Comment: (NOTE) The eGFR has been calculated using the CKD EPI equation. This calculation has not been validated in all clinical situations. eGFR's persistently <60 mL/min signify possible Chronic Kidney Disease.    Anion gap 11 5 - 15    Comment: Performed at [facility], [place] [address], [place], [place] [address]  CBC     Status: Abnormal   Collection Time: 08/26/17 11:36 AM  Result Value Ref Range   WBC 7.7 4.0 - 10.5 K/uL   RBC 4.75 4.22 - 5.81 MIL/uL   Hemoglobin 16.5 13.0 - 17.0 g/dL    HCT 47.7 39.0 - 52.0 %   MCV 100.4 (H) 78.0 - 100.0 fL   MCH 34.7 (H) 26.0 - 34.0 pg   MCHC 34.6 30.0 - 36.0 g/dL   RDW 12.8 11.5 - 15.5 %   Platelets 176 150 - 400 K/uL    Comment: Performed at [facility], [place] [address] [place],  [address]  Urinalysis, Routine w reflex microscopic     Status: Abnormal   Collection Time: 08/26/17  2:54 PM  Result Value Ref Range   Color, Urine AMBER (A) YELLOW    Comment: BIOCHEMICALS MAY BE AFFECTED BY COLOR   APPearance HAZY (A) CLEAR   Specific Gravity, Urine >1.046 (H) 1.005 - 1.030   pH 5.0 5.0 - 8.0   Glucose, UA NEGATIVE NEGATIVE mg/dL   Hgb urine dipstick NEGATIVE NEGATIVE   Bilirubin Urine SMALL (A) NEGATIVE   Ketones, ur 5 (A) NEGATIVE mg/dL   Protein, ur >=300 (A) NEGATIVE mg/dL   Nitrite NEGATIVE NEGATIVE   Leukocytes, UA NEGATIVE NEGATIVE   RBC / HPF 6-10 0 - 5 RBC/hpf   WBC, UA 0-5 0 - 5 WBC/hpf   Bacteria, UA NONE SEEN NONE SEEN   Squamous Epithelial / LPF 0-5 0 - 5    Comment: Please note change in reference range.   Mucus PRESENT    Hyaline Casts, UA PRESENT    Granular Casts, UA PRESENT    Cellular Cast, UA 15  Comment: Performed at [facility], [place] [address] [place], [place] [address]  Protime-INR     Status: None   Collection Time: 08/26/17  8:05 PM  Result Value Ref Range   Prothrombin Time 13.9 11.4 - 15.2 seconds   INR 1.08     Comment: Performed at [facility], [place] [address] [place], [place] [address]  APTT     Status: None   Collection Time: 08/26/17  8:05 PM  Result Value Ref Range   aPTT 28 24 - 36 seconds    Comment: Performed at [facility], [place] [address], [place], [place] [address]  Type and screen [place]     Status: None   Collection Time: 08/26/17  8:05 PM  Result Value Ref Range   ABO/RH(D) O POS    Antibody Screen NEG    Sample Expiration      08/29/2017 Performed at [facility], [place] [address], [place], [place] [address]      RADIOLOGY RESULTS: Ct Abdomen Pelvis W Contrast  Result Date: 08/26/2017 CLINICAL DATA:  Patient c/o mid abdominal pain and diarrhea x 2 days; hx multiple abd surgeries: ileostomy and reversal for diverticulitis EXAM: CT ABDOMEN AND PELVIS WITH CONTRAST TECHNIQUE: Multidetector CT imaging of the abdomen and pelvis was performed using the standard protocol following bolus administration of intravenous contrast. CONTRAST:  148m ISOVUE-300 IOPAMIDOL (ISOVUE-300) INJECTION 61% COMPARISON:  12/14/2012 FINDINGS: Lower chest: No acute abnormality. Hepatobiliary: Liver demonstrates decreased attenuation consistent with fatty infiltration. No liver mass or focal lesion. Gallbladder surgically absent. No bile duct dilation. Pancreas: Unremarkable. No pancreatic ductal dilatation or surrounding inflammatory changes. Spleen: Normal in size without focal abnormality. Adrenals/Urinary Tract: Adrenal glands are unremarkable. Kidneys are normal, without renal calculi, focal lesion, or hydronephrosis. Bladder is unremarkable. Stomach/Bowel: There is mild dilation of the proximal to mid small bowel with a transition point in the central abdomen. Just to the left of the umbilicus, there is a knuckle of small bowel that slightly herniates through the anterior abdominal wall. Short portion of the small bowel distal to this is decompressed. The loops thin becomes mildly dilated where it joins an anastomosis staple line, in the central abdomen just below the level of the umbilicus. The bowel distal to the anastomosis staple line is mostly decompressed. There is no small bowel wall thickening or adjacent inflammation. Stomach is unremarkable. There is a bowel anastomosis staple line along the mid sigmoid colon. There are scattered diverticula along the colon superior to this. No diverticulitis or other colonic inflammatory process. Vascular/Lymphatic: Minor aortic  atherosclerosis. Atherosclerotic plaque noted in the iliac arteries. No aneurysm. No pathologically enlarged lymph nodes. Reproductive: Unremarkable. Other: No ascites. Musculoskeletal: No fracture or acute finding. No osteoblastic or osteolytic lesions. IMPRESSION: 1. Partial small bowel obstruction. The transition point is in the low central abdomen just below the umbilicus. There are 2 adjacent areas of narrowing, 1 involving a small ventral hernia just lateral to the umbilicus and the other just below this, at the efferent side of a small bowel anastomosis. 2. No evidence of bowel inflammation or ischemia. 3. No other acute abnormalities. 4. Hepatic steatosis. 5. Minor aortic atherosclerosis. Electronically Signed   By: DLajean ManesM.D.   On: 08/26/2017 17:32    Problem List: Patient Active Problem List   Diagnosis Date Noted  . Partial small bowel obstruction ([place] 08/26/2017  . GERD (gastroesophageal reflux disease) 08/26/2017  . Diabetes mellitus  type 2, uncontrolled, with complications ([place]) 77/82/4235  . Diabetic neuropathy ([place]) 03/25/2012  . Pancreatitis, acute 03/23/2012  . Hyponatremia 03/23/2012  . Alcohol abuse 03/23/2012  . GOUT 05/30/2009  . Morbid obesity ([place]) 05/30/2009  . Essential hypertension 05/30/2009    Assessment & Plan: Chronic partial small bowel obstruction possibly from recurrent hernia below the umbilicus on the left with chronically incarcerated partially obstructing loop of small bowel.  Plan admit for observation with IV fluids follow-up to see if this improves and if not consider surgical re-intervention.    Matt B. Hassell Done, MD, [facility] [phone] beeper [phone]  08/26/2017 10:00 PM

## 2017-08-26 NOTE — ED Provider Notes (Signed)
[facility]-EMERGENCY DEPT Provider Note   CSN: [id number] Arrival date & time: 08/26/17  1020     History   Chief Complaint Chief Complaint  Patient presents with  . Abdominal Pain  . Diarrhea    HPI [name] is a 52 y.o. male.  HPI   [name] is a 52yo male with a history of pancreatitis, diverticulitis, bowel obstruction, several abdominal surgeries (cholecystectomy, appendectomy, ventral hernia repair), HLD, HTN, T2DM who presents to the emergency department for evaluation of abdominal pain and diarrhea.  Patient reports that his abdominal pain started 2 days ago and is located in a horizontal line across the mid-abdomen. Pain comes and goes, feels like "gas and pressure, like I have to have a bowel movement but I cant." States that pain is worsened with eating, thus he has not had anything to eat today.  Also reports that he had diarrhea about every hour yesterday which was watery and nonbloody.  Had one episode of diarrhea today.  He denies recent antibiotic use or travel outside the country.  States his abdomen feels distended.  He tried taking OTC gas medicine, Metamucil, aleve, tylenol and oxycodone for his symptoms without significant relief.  States that his pain feels similar to when he had a bowel obstruction in the past.  Denies fever, chills, nausea/vomiting, dysuria, urinary frequency, hematuria, flank pain, testicular pain/swelling, shortness of breath, chest pain, headache, lightheadedness or syncope.  Past Medical History:  Diagnosis Date  . Diabetes mellitus without complication (HCC)   . Diabetic neuropathy (HCC) 03/25/2012  . Diverticulitis   . DM type 2 (diabetes mellitus, type 2) (HCC) 03/24/2012  . Hyperlipidemia 03/24/2012  . Hypertension   . Incisional hernia without mention of obstruction or gangrene 02/07/2011  . Pancreatitis   . Paroxysmal atrial fibrillation (HCC) 03/25/2012   Transient, occurred off of atenolol.  . Renal  disorder     Patient Active Problem List   Diagnosis Date Noted  . Diabetes mellitus type 2, uncontrolled, with complications (HCC) 12/15/2012  . Diabetic neuropathy (HCC) 03/25/2012  . Pancreatitis, acute 03/23/2012  . Hyponatremia 03/23/2012  . Alcohol abuse 03/23/2012  . GOUT 05/30/2009  . Morbid obesity (HCC) 05/30/2009  . HYPERTENSION 05/30/2009    Past Surgical History:  Procedure Laterality Date  . APPENDECTOMY  04/15/2009  . COLON SURGERY  04/15/2009  . HERNIA REPAIR  01/25/11   lap ventral hernia repair x9  . LAPAROSCOPIC CHOLECYSTECTOMY    . LAPAROSCOPIC LYSIS OF ADHESIONS N/A 09/17/2012   Procedure: LAPAROSCOPIC and Open  LYSIS OF ADHESIONS;  Surgeon: Ardeth Sportsman, MD;  Location: [facility]S;  Service: General;  Laterality: N/A;  . VENTRAL HERNIA REPAIR N/A 09/17/2012   Procedure: LAPAROSCOPIC VENTRAL HERNIA;  Surgeon: Ardeth Sportsman, MD;  Location: [facility];  Service: General;  Laterality: N/A;        Home Medications    Prior to Admission medications   Medication Sig Start Date End Date Taking? Authorizing Provider  allopurinol (ZYLOPRIM) 300 MG tablet Take 300 mg by mouth daily.  01/21/11   [provider]  atenolol (TENORMIN) 100 MG tablet Take 100 mg by mouth daily.    [provider]  CINNAMON PO Take by mouth.    [provider]  colchicine 0.6 MG tablet Take 0.6 mg by mouth every Tuesday, Thursday, Saturday, and Sunday.    [provider]  gabapentin (NEURONTIN) 100 MG capsule Take 1-2 capsules (100-200 mg total) by mouth at  bedtime as needed. For burning sensation in feet. 03/25/12   Elliot Cousin, MD  HYDROcodone-acetaminophen (NORCO) 5-325 MG per tablet Take 1 tablet by mouth every 6 (six) hours as needed for pain. 12/16/12   Alison Murray, MD  insulin aspart (NOVOLOG) 100 UNIT/ML injection Inject 7 Units into the skin every evening.    [provider]  insulin detemir (LEVEMIR) 100 UNIT/ML injection Inject 60 Units  into the skin 2 (two) times daily.     [provider]  MAGNESIUM-ZINC PO Take 1 tablet by mouth daily.    [provider]  omeprazole (PRILOSEC) 40 MG capsule Take 40 mg by mouth every Tuesday, Thursday, Saturday, and Sunday.    [provider]  telmisartan-hydrochlorothiazide (MICARDIS HCT) 80-12.5 MG per tablet Take 1 tablet by mouth daily.    [provider]    Family History History reviewed. No pertinent family history.  Social History Social History   Tobacco Use  . Smoking status: Former Smoker    Packs/day: 1.00    Years: 30.00    Pack years: 30.00    Types: Cigarettes  . Smokeless tobacco: Never Used  Substance Use Topics  . Alcohol use: Yes  . Drug use: No     Allergies   Hydrocodone   Review of Systems Review of Systems  Constitutional: Positive for appetite change (decreased). Negative for chills and fever.  Eyes: Negative for visual disturbance.  Respiratory: Negative for shortness of breath.   Cardiovascular: Negative for chest pain.  Gastrointestinal: Positive for abdominal distention, abdominal pain and diarrhea. Negative for blood in stool, nausea and vomiting.  Genitourinary: Negative for difficulty urinating, dysuria, flank pain, frequency, scrotal swelling and testicular pain.  Musculoskeletal: Negative for gait problem.  Skin: Negative for rash.  Neurological: Negative for weakness, numbness and headaches.  Psychiatric/Behavioral: Negative for agitation.     Physical Exam Updated Vital Signs BP (!) 169/104 (BP Location: Right Arm)   Pulse 78   Temp 97.8 F (36.6 C) (Oral)   Resp 16   Ht  (1.803 m)   Wt 136.1 kg (300 lb)   SpO2 98%   BMI 41.84 kg/m   Physical Exam  Constitutional: He is oriented to person, place, and time. He appears well-developed and well-nourished. No distress.  Sitting at bedside in no apparent distress, non-toxic appearing.   HENT:  Head: Normocephalic and atraumatic.    Mouth/Throat: Oropharynx is clear and moist. No oropharyngeal exudate.  Eyes: Pupils are equal, round, and reactive to light. Conjunctivae are normal. Right eye exhibits no discharge. Left eye exhibits no discharge.  Neck: Normal range of motion. Neck supple.  Cardiovascular: Normal rate, regular rhythm and intact distal pulses.  No murmur heard. Pulmonary/Chest: Effort normal. No stridor. No respiratory distress. He has no rales.  Abdominal: Bowel sounds are normal.  Abdomen soft. Midline vertical surgical scar which is well healed. Tender to palpation over the mid-abdomen on the left and right of the umbilicus with guarding. No rebound tenderness. No CVA tenderness.   Musculoskeletal: Normal range of motion.  Neurological: He is alert and oriented to person, place, and time. Coordination normal.  Skin: Skin is warm and dry. Capillary refill takes less than 2 seconds. He is not diaphoretic.  Psychiatric: He has a normal mood and affect. His behavior is normal.  Nursing note and vitals reviewed.    ED Treatments / Results  Labs (all labs ordered are listed, but only abnormal results are displayed) Labs Reviewed  COMPREHENSIVE METABOLIC PANEL - Abnormal; Notable for the following components:      Result Value   CO2 21 (*)    Glucose, Bld 156 (*)    Calcium 8.8 (*)    All other components within normal limits  CBC - Abnormal; Notable for the following components:   MCV 100.4 (*)    MCH 34.7 (*)    All other components within normal limits  URINALYSIS, ROUTINE W REFLEX MICROSCOPIC - Abnormal; Notable for the following components:   Color, Urine AMBER (*)    APPearance HAZY (*)    Specific Gravity, Urine >1.046 (*)    Bilirubin Urine SMALL (*)    Ketones, ur 5 (*)    Protein, ur >=300 (*)    All other components within normal limits  LIPASE, BLOOD    EKG None  Radiology Ct Abdomen Pelvis W Contrast  Result Date: 08/26/2017 CLINICAL DATA:  Patient c/o mid abdominal pain and  diarrhea x 2 days; hx multiple abd surgeries: ileostomy and reversal for diverticulitis EXAM: CT ABDOMEN AND PELVIS WITH CONTRAST TECHNIQUE: Multidetector CT imaging of the abdomen and pelvis was performed using the standard protocol following bolus administration of intravenous contrast. CONTRAST:  ISOVUE-300 IOPAMIDOL (ISOVUE-300) INJECTION 61% COMPARISON:  12/14/2012 FINDINGS: Lower chest: No acute abnormality. Hepatobiliary: Liver demonstrates decreased attenuation consistent with fatty infiltration. No liver mass or focal lesion. Gallbladder surgically absent. No bile duct dilation. Pancreas: Unremarkable. No pancreatic ductal dilatation or surrounding inflammatory changes. Spleen: Normal in size without focal abnormality. Adrenals/Urinary Tract: Adrenal glands are unremarkable. Kidneys are normal, without renal calculi, focal lesion, or hydronephrosis. Bladder is unremarkable. Stomach/Bowel: There is mild dilation of the proximal to mid small bowel with a transition point in the central abdomen. Just to the left of the umbilicus, there is a knuckle of small bowel that slightly herniates through the anterior abdominal wall. Short portion of the small bowel distal to this is decompressed. The loops thin becomes mildly dilated where it joins an anastomosis staple line, in the central abdomen just below the level of the umbilicus. The bowel distal to the anastomosis staple line is mostly decompressed. There is no small bowel wall thickening or adjacent inflammation. Stomach is unremarkable. There is a bowel anastomosis staple line along the mid sigmoid colon. There are scattered diverticula along the colon superior to this. No diverticulitis or other colonic inflammatory process. Vascular/Lymphatic: Minor aortic atherosclerosis. Atherosclerotic plaque noted in the iliac arteries. No aneurysm. No pathologically enlarged lymph nodes. Reproductive: Unremarkable. Other: No ascites. Musculoskeletal: No fracture  or acute finding. No osteoblastic or osteolytic lesions. IMPRESSION: 1. Partial small bowel obstruction. The transition point is in the low central abdomen just below the umbilicus. There are 2 adjacent areas of narrowing, 1 involving a small ventral hernia just lateral to the umbilicus and the other just below this, at the efferent side of a small bowel anastomosis. 2. No evidence of bowel inflammation or ischemia. 3. No other acute abnormalities. 4. Hepatic steatosis. 5. Minor aortic atherosclerosis. Electronically Signed   By: Amie Portland M.D.   On: 08/26/2017 17:32    Procedures Procedures (including critical care time)  Medications Ordered in ED Medications  ondansetron (ZOFRAN) injection 4 mg (4 mg Intravenous Refused 08/26/17 1646)  iopamidol (ISOVUE-300) 61 % injection (has no administration in time range)  HYDROmorphone (DILAUDID) injection 1 mg (1 mg Intravenous Given 08/26/17 2322)  insulin glargine (LANTUS) injection 75 Units (has no administration in time range)  ondansetron (ZOFRAN) injection  4 mg (has no administration in time range)  famotidine (PEPCID) IVPB 20 mg premix (20 mg Intravenous New Bag/Given 08/26/17 2243)  insulin aspart (novoLOG) injection 0-9 Units (has no administration in time range)  LORazepam (ATIVAN) tablet 1 mg (has no administration in time range)    Or  LORazepam (ATIVAN) injection 1 mg (has no administration in time range)  thiamine (VITAMIN B-1) tablet 100 mg (100 mg Oral Given 08/26/17 2030)    Or  thiamine (B-1) injection 100 mg ( Intravenous See Alternative 08/26/17 2030)  folic acid (FOLVITE) tablet 1 mg (1 mg Oral Given 08/26/17 2030)  multivitamin with minerals tablet 1 tablet (1 tablet Oral Given 08/26/17 2030)  LORazepam (ATIVAN) injection 0-4 mg (0 mg Intravenous Not Given 08/26/17 2025)    Followed by  LORazepam (ATIVAN) injection 0-4 mg (has no administration in time range)  0.9 %  sodium chloride infusion ( Intravenous New Bag/Given 08/26/17 1935)    acetaminophen (TYLENOL) tablet 650 mg (has no administration in time range)    Or  acetaminophen (TYLENOL) suppository 650 mg (has no administration in time range)  hydrALAZINE (APRESOLINE) injection 5 mg (has no administration in time range)  zolpidem (AMBIEN) tablet 5 mg (5 mg Oral Given 08/26/17 2322)  allopurinol (ZYLOPRIM) tablet 300 mg (has no administration in time range)  atenolol (TENORMIN) tablet 100 mg (has no administration in time range)  lisinopril (PRINIVIL,ZESTRIL) tablet 5 mg (has no administration in time range)  meloxicam (MOBIC) tablet 15 mg (has no administration in time range)  pravastatin (PRAVACHOL) tablet 20 mg (has no administration in time range)  sodium chloride 0.9 % bolus 1,000 mL (0 mLs Intravenous Stopped 08/26/17 1851)  HYDROmorphone (DILAUDID) injection 1 mg (1 mg Intravenous Given 08/26/17 1646)  iopamidol (ISOVUE-300) 61 % injection 125 mL (125 mLs Intravenous Contrast Given 08/26/17 1654)     Initial Impression / Assessment and Plan / ED Course  I have reviewed the triage vital signs and the nursing notes.  Pertinent labs & imaging results that were available during my care of the patient were reviewed by me and considered in my medical decision making (see chart for details).    Patient with CT evidence of small bowel obstruction with transition point in the low central abdomen just below the umbilicus. No evidence of inflammation or ischemic bowel. CBC without leukocytosis. Hgb stable. CMP without any major electrolyte abnormalities. Glucose somewhat elevated bg 156, patient known diabetic. Lipase negative. Patient's pain managed in the ED, no active vomiting. NPO. Discussed this patient with Hospitalist Dr. Clyde Lundborg who will admit the patient. Also discussed this patient with General Surgeon Dr. Daphine Deutscher who saw the patient and will consult once admitted to Hospitalist Service.   Final Clinical Impressions(s) / ED Diagnoses   Final diagnoses:  None    ED  Discharge Orders    None       Lawrence Marseilles 08/26/17 2357    Benjiman Core, MD 08/27/17 [phone]

## 2017-08-26 NOTE — ED Notes (Signed)
Patient transported to CT 

## 2017-08-26 NOTE — ED Notes (Signed)
ED TO INPATIENT HANDOFF REPORT  Name/Age/Gender [name] 52 y.o. male  Code Status    Code Status Orders  (From admission, onward)        Start     Ordered   08/26/17 1906  Full code  Continuous     08/26/17 1905    Code Status History    Date Active Date Inactive Code Status Order ID Comments User Context   12/14/2012 2309 12/16/2012 1626 Full Code [id number]  Etta Quill, DO ED   09/17/2012 2104 09/20/2012 1501 Full Code [id number]  Edward Jolly, MD Inpatient   09/17/2012 0742 09/17/2012 2104 Full Code [id number]  Erby Pian, NP ED   03/23/2012 1559 03/25/2012 2037 Full Code [id number]  Kathie Dike, MD Inpatient      Home/SNF/Other Home  Chief Complaint abd pain  Level of Care/Admitting Diagnosis ED Disposition    ED Disposition Condition [facility]: [facility] [[id number]]  Level of Care: Telemetry [5]  Admit to tele based on following criteria: Other see comments  Comments: hx of A fib  Diagnosis: Partial small bowel obstruction [facility]) [[id number]]  Admitting Physician: Ivor Costa [[id number]]  Attending Physician: Ivor Costa [phone]  Estimated length of stay: past midnight tomorrow  Certification:: I certify this patient will need inpatient services for at least 2 midnights  PT Class (Do Not Modify): Inpatient [101]  PT Acc Code (Do Not Modify): Private [1]       Medical History Past Medical History:  Diagnosis Date  . Diabetes mellitus without complication ([place])   . Diabetic neuropathy ([place]) 03/25/2012  . Diverticulitis   . DM type 2 (diabetes mellitus, type 2) ([place]) 03/24/2012  . Hyperlipidemia 03/24/2012  . Hypertension   . Incisional hernia without mention of obstruction or gangrene 02/07/2011  . Pancreatitis   . Paroxysmal atrial fibrillation ([place]) 03/25/2012   Transient, occurred off of atenolol.  . Renal disorder     Allergies Allergies  Allergen Reactions  . Hydrocodone Itching    IV  Location/Drains/Wounds Patient Lines/Drains/Airways Status   Active Line/Drains/Airways    Name:   Placement date:   Placement time:   Site:   Days:   Peripheral IV 08/26/17 Left Antecubital   08/26/17    1645    Antecubital   less than 1   Closed System Drain 1 Left Abdomen Bulb (JP) 15 Fr.   09/17/12    1821    Abdomen   1804   Wound 12/15/12 Other (Comment) Abdomen Lower;Mid old unhealed surgical wound   12/15/12    0111    Abdomen   1715          Labs/Imaging Results for orders placed or performed during the hospital encounter of 08/26/17 (from the past 48 hour(s))  Lipase, blood     Status: None   Collection Time: 08/26/17 11:36 AM  Result Value Ref Range   Lipase 19 11 - 51 U/L    Comment: Performed at [facility], [place] [address], [place], [place] [address]  Comprehensive metabolic panel     Status: Abnormal   Collection Time: 08/26/17 11:36 AM  Result Value Ref Range   Sodium 138 135 - 145 mmol/L   Potassium 4.2 3.5 - 5.1 mmol/L   Chloride 106 101 - 111 mmol/L   CO2 21 (L) 22 - 32 mmol/L   Glucose, Bld 156 (H) 65 - 99 mg/dL   BUN 15 6 - 20  mg/dL   Creatinine, Ser 0.95 0.61 - 1.24 mg/dL   Calcium 8.8 (L) 8.9 - 10.3 mg/dL   Total Protein 7.2 6.5 - 8.1 g/dL   Albumin 3.5 3.5 - 5.0 g/dL   AST 25 15 - 41 U/L   ALT 22 17 - 63 U/L   Alkaline Phosphatase 54 38 - 126 U/L   Total Bilirubin 0.8 0.3 - 1.2 mg/dL   GFR calc non Af Amer >60 >60 mL/min   GFR calc Af Amer >60 >60 mL/min    Comment: (NOTE) The eGFR has been calculated using the CKD EPI equation. This calculation has not been validated in all clinical situations. eGFR's persistently <60 mL/min signify possible Chronic Kidney Disease.    Anion gap 11 5 - 15    Comment: Performed at [facility], [place] [address] [place], [place] [address]  CBC     Status: Abnormal   Collection Time: 08/26/17 11:36 AM  Result Value Ref Range   WBC 7.7 4.0 - 10.5 K/uL   RBC 4.75 4.22 - 5.81  MIL/uL   Hemoglobin 16.5 13.0 - 17.0 g/dL   HCT 47.7 39.0 - 52.0 %   MCV 100.4 (H) 78.0 - 100.0 fL   MCH 34.7 (H) 26.0 - 34.0 pg   MCHC 34.6 30.0 - 36.0 g/dL   RDW 12.8 11.5 - 15.5 %   Platelets 176 150 - 400 K/uL    Comment: Performed at [facility], [place] [address], [place], [place] [address]  Urinalysis, Routine w reflex microscopic     Status: Abnormal   Collection Time: 08/26/17  2:54 PM  Result Value Ref Range   Color, Urine AMBER (A) YELLOW    Comment: BIOCHEMICALS MAY BE AFFECTED BY COLOR   APPearance HAZY (A) CLEAR   Specific Gravity, Urine >1.046 (H) 1.005 - 1.030   pH 5.0 5.0 - 8.0   Glucose, UA NEGATIVE NEGATIVE mg/dL   Hgb urine dipstick NEGATIVE NEGATIVE   Bilirubin Urine SMALL (A) NEGATIVE   Ketones, ur 5 (A) NEGATIVE mg/dL   Protein, ur >=300 (A) NEGATIVE mg/dL   Nitrite NEGATIVE NEGATIVE   Leukocytes, UA NEGATIVE NEGATIVE   RBC / HPF 6-10 0 - 5 RBC/hpf   WBC, UA 0-5 0 - 5 WBC/hpf   Bacteria, UA NONE SEEN NONE SEEN   Squamous Epithelial / LPF 0-5 0 - 5    Comment: Please note change in reference range.   Mucus PRESENT    Hyaline Casts, UA PRESENT    Granular Casts, UA PRESENT    Cellular Cast, UA 15     Comment: Performed at [facility], [place] [address] [place], [place] [address]  Protime-INR     Status: None   Collection Time: 08/26/17  8:05 PM  Result Value Ref Range   Prothrombin Time 13.9 11.4 - 15.2 seconds   INR 1.08     Comment: Performed at [facility], [place] [address] [place], [place] [address]  APTT     Status: None   Collection Time: 08/26/17  8:05 PM  Result Value Ref Range   aPTT 28 24 - 36 seconds    Comment: Performed at [facility], [place] [address], [place], [place] [address]  Type and screen [place]     Status: None (Preliminary result)   Collection Time: 08/26/17  8:05 PM  Result Value Ref Range   ABO/RH(D) O POS    Antibody  Screen  PENDING    Sample Expiration      08/29/2017 Performed at [facility], [place] [address], [place], [place] [address]    Ct Abdomen Pelvis W Contrast  Result Date: 08/26/2017 CLINICAL DATA:  Patient c/o mid abdominal pain and diarrhea x 2 days; hx multiple abd surgeries: ileostomy and reversal for diverticulitis EXAM: CT ABDOMEN AND PELVIS WITH CONTRAST TECHNIQUE: Multidetector CT imaging of the abdomen and pelvis was performed using the standard protocol following bolus administration of intravenous contrast. CONTRAST:  166m ISOVUE-300 IOPAMIDOL (ISOVUE-300) INJECTION 61% COMPARISON:  12/14/2012 FINDINGS: Lower chest: No acute abnormality. Hepatobiliary: Liver demonstrates decreased attenuation consistent with fatty infiltration. No liver mass or focal lesion. Gallbladder surgically absent. No bile duct dilation. Pancreas: Unremarkable. No pancreatic ductal dilatation or surrounding inflammatory changes. Spleen: Normal in size without focal abnormality. Adrenals/Urinary Tract: Adrenal glands are unremarkable. Kidneys are normal, without renal calculi, focal lesion, or hydronephrosis. Bladder is unremarkable. Stomach/Bowel: There is mild dilation of the proximal to mid small bowel with a transition point in the central abdomen. Just to the left of the umbilicus, there is a knuckle of small bowel that slightly herniates through the anterior abdominal wall. Short portion of the small bowel distal to this is decompressed. The loops thin becomes mildly dilated where it joins an anastomosis staple line, in the central abdomen just below the level of the umbilicus. The bowel distal to the anastomosis staple line is mostly decompressed. There is no small bowel wall thickening or adjacent inflammation. Stomach is unremarkable. There is a bowel anastomosis staple line along the mid sigmoid colon. There are scattered diverticula along the colon superior to this. No diverticulitis or other colonic  inflammatory process. Vascular/Lymphatic: Minor aortic atherosclerosis. Atherosclerotic plaque noted in the iliac arteries. No aneurysm. No pathologically enlarged lymph nodes. Reproductive: Unremarkable. Other: No ascites. Musculoskeletal: No fracture or acute finding. No osteoblastic or osteolytic lesions. IMPRESSION: 1. Partial small bowel obstruction. The transition point is in the low central abdomen just below the umbilicus. There are 2 adjacent areas of narrowing, 1 involving a small ventral hernia just lateral to the umbilicus and the other just below this, at the efferent side of a small bowel anastomosis. 2. No evidence of bowel inflammation or ischemia. 3. No other acute abnormalities. 4. Hepatic steatosis. 5. Minor aortic atherosclerosis. Electronically Signed   By: DLajean ManesM.D.   On: 08/26/2017 17:32    Pending Labs Unresulted Labs (From admission, onward)   Start     Ordered   08/27/17 09675 Basic metabolic panel  Tomorrow morning,   R     08/26/17 1905   08/27/17 0500  CBC  Tomorrow morning,   R     08/26/17 1905   08/26/17 2005  ABO/Rh  Once,   R     08/26/17 2005   08/26/17 1905  HIV antibody (Routine Testing)  Once,   R     08/26/17 1905      Vitals/Pain Today's Vitals   08/26/17 1930 08/26/17 1935 08/26/17 1938 08/26/17 2009  BP: (!) 151/90  (!) 151/90   Pulse: 81  80   Resp: 17     Temp:      TempSrc:      SpO2: 96%     Weight:      Height:      PainSc:  3   0-No pain    Isolation Precautions No active isolations  Medications Medications  ondansetron (ZOFRAN) injection 4  mg (4 mg Intravenous Refused 08/26/17 1646)  iopamidol (ISOVUE-300) 61 % injection (has no administration in time range)  HYDROmorphone (DILAUDID) injection 1 mg (1 mg Intravenous Given 08/26/17 1936)  Insulin Glargine SOPN 75 Units (has no administration in time range)  ondansetron (ZOFRAN) injection 4 mg (has no administration in time range)  famotidine (PEPCID) IVPB 20 mg premix (has  no administration in time range)  insulin aspart (novoLOG) injection 0-9 Units (has no administration in time range)  LORazepam (ATIVAN) tablet 1 mg (has no administration in time range)    Or  LORazepam (ATIVAN) injection 1 mg (has no administration in time range)  thiamine (VITAMIN B-1) tablet 100 mg (100 mg Oral Given 08/26/17 2030)    Or  thiamine (B-1) injection 100 mg ( Intravenous See Alternative 0/0/34 9179)  folic acid (FOLVITE) tablet 1 mg (1 mg Oral Given 08/26/17 2030)  multivitamin with minerals tablet 1 tablet (1 tablet Oral Given 08/26/17 2030)  LORazepam (ATIVAN) injection 0-4 mg (0 mg Intravenous Not Given 08/26/17 2025)    Followed by  LORazepam (ATIVAN) injection 0-4 mg (has no administration in time range)  0.9 %  sodium chloride infusion ( Intravenous New Bag/Given 08/26/17 1935)  acetaminophen (TYLENOL) tablet 650 mg (has no administration in time range)    Or  acetaminophen (TYLENOL) suppository 650 mg (has no administration in time range)  hydrALAZINE (APRESOLINE) injection 5 mg (has no administration in time range)  zolpidem (AMBIEN) tablet 5 mg (has no administration in time range)  allopurinol (ZYLOPRIM) tablet 300 mg (has no administration in time range)  atenolol (TENORMIN) tablet 100 mg (has no administration in time range)  lisinopril (PRINIVIL,ZESTRIL) tablet 5 mg (has no administration in time range)  meloxicam (MOBIC) tablet 15 mg (has no administration in time range)  Calcium-Magnesium-Zinc-D3 TABS 1 tablet (has no administration in time range)  pravastatin (PRAVACHOL) tablet 20 mg (has no administration in time range)  sodium chloride 0.9 % bolus 1,000 mL (0 mLs Intravenous Stopped 08/26/17 1851)  HYDROmorphone (DILAUDID) injection 1 mg (1 mg Intravenous Given 08/26/17 1646)  iopamidol (ISOVUE-300) 61 % injection 125 mL (125 mLs Intravenous Contrast Given 08/26/17 1654)    Mobility walks

## 2017-08-27 ENCOUNTER — Inpatient Hospital Stay (HOSPITAL_COMMUNITY): Payer: BLUE CROSS/BLUE SHIELD

## 2017-08-27 LAB — CBC
HCT: 43.1 % (ref 39.0–52.0)
Hemoglobin: 14.6 g/dL (ref 13.0–17.0)
MCH: 34.4 pg — ABNORMAL HIGH (ref 26.0–34.0)
MCHC: 33.9 g/dL (ref 30.0–36.0)
MCV: 101.7 fL — AB (ref 78.0–100.0)
PLATELETS: 162 10*3/uL (ref 150–400)
RBC: 4.24 MIL/uL (ref 4.22–5.81)
RDW: 13 % (ref 11.5–15.5)
WBC: 6.6 10*3/uL (ref 4.0–10.5)

## 2017-08-27 LAB — GLUCOSE, CAPILLARY
GLUCOSE-CAPILLARY: 70 mg/dL (ref 65–99)
Glucose-Capillary: 79 mg/dL (ref 65–99)
Glucose-Capillary: 103 mg/dL — ABNORMAL HIGH (ref 65–99)
Glucose-Capillary: 138 mg/dL — ABNORMAL HIGH (ref 65–99)

## 2017-08-27 LAB — BASIC METABOLIC PANEL
ANION GAP: 10 (ref 5–15)
BUN: 14 mg/dL (ref 6–20)
CO2: 24 mmol/L (ref 22–32)
CREATININE: 0.72 mg/dL (ref 0.61–1.24)
Calcium: 8.5 mg/dL — ABNORMAL LOW (ref 8.9–10.3)
Chloride: 107 mmol/L (ref 101–111)
GFR calc Af Amer: >60 mL/min (ref >60)
GFR calc non Af Amer: >60 mL/min (ref >60)
Glucose, Bld: 81 mg/dL (ref 65–99)
POTASSIUM: 3.6 mmol/L (ref 3.5–5.1)
Sodium: 141 mmol/L (ref 135–145)

## 2017-08-27 LAB — ABO/RH: ABO/RH(D): O POS

## 2017-08-27 LAB — HIV ANTIBODY (ROUTINE TESTING W REFLEX): HIV Screen 4th Generation wRfx: NONREACTIVE

## 2017-08-27 NOTE — Plan of Care (Signed)
  Problem: Health Behavior/Discharge Planning: Goal: Ability to manage health-related needs will improve Outcome: Progressing   Problem: Clinical Measurements: Goal: Ability to maintain clinical measurements within normal limits will improve Outcome: Progressing Goal: Diagnostic test results will improve Outcome: Progressing Note:  Abd x ray improved   Problem: Nutrition: Goal: Adequate nutrition will be maintained Outcome: Progressing Note:  Advanced to clear liquids today   Problem: Elimination: Goal: Will not experience complications related to bowel motility Outcome: Progressing Note:  +BM and flatus today. +BS   Problem: Pain Managment: Goal: General experience of comfort will improve Outcome: Progressing   Problem: Safety: Goal: Ability to remain free from injury will improve Outcome: Progressing

## 2017-08-27 NOTE — Progress Notes (Signed)
Subjective/Chief Complaint: Feels a little better. Passing flatus and had a liquid bm   Objective: Vital signs in last 24 hours: Temp:  [97.9 F (36.6 C)-98.3 F (36.8 C)] 98.3 F (36.8 C) (05/07 0917) Pulse Rate:  [68-88] 85 (05/07 0917) Resp:  [16-18] 18 (05/07 0917) BP: (144-169)/(90-104) 165/97 (05/07 0917) SpO2:  [94 %-98 %] 97 % (05/07 0917) Weight:  [134.9 kg (297 lb 6.4 oz)] 134.9 kg (297 lb 6.4 oz) (05/06 2158) Last BM Date: 08/27/17  Intake/Output from previous day: 05/06 0701 - 05/07 0700 In: 1524.2 [P.O.:120; I.V.:1354.2; IV Piggyback:50] Out: -  Intake/Output this shift: Total I/O In: 435.4 [I.V.:385.4; IV Piggyback:50] Out: -   General appearance: alert and cooperative Resp: clear to auscultation bilaterally Cardio: regular rate and rhythm GI: soft, mild tenderness left abdomen. good bs  Lab Results:  Recent Labs    08/26/17 1136 08/27/17 0516  WBC 7.7 6.6  HGB 16.5 14.6  HCT 47.7 43.1  PLT 176 162   BMET Recent Labs    08/26/17 1136 08/27/17 0516  NA 138 141  K 4.2 3.6  CL 106 107  CO2 21* 24  GLUCOSE 156* 81  BUN 15 14  CREATININE 0.95 0.72  CALCIUM 8.8* 8.5*   PT/INR Recent Labs    08/26/17 2005  LABPROT 13.9  INR 1.08   ABG No results for input(s): PHART, HCO3 in the last 72 hours.  Invalid input(s): PCO2, PO2  Studies/Results: Ct Abdomen Pelvis W Contrast  Result Date: 08/26/2017 CLINICAL DATA:  Patient c/o mid abdominal pain and diarrhea x 2 days; hx multiple abd surgeries: ileostomy and reversal for diverticulitis EXAM: CT ABDOMEN AND PELVIS WITH CONTRAST TECHNIQUE: Multidetector CT imaging of the abdomen and pelvis was performed using the standard protocol following bolus administration of intravenous contrast. CONTRAST:  ISOVUE-300 IOPAMIDOL (ISOVUE-300) INJECTION 61% COMPARISON:  12/14/2012 FINDINGS: Lower chest: No acute abnormality. Hepatobiliary: Liver demonstrates decreased attenuation consistent with fatty  infiltration. No liver mass or focal lesion. Gallbladder surgically absent. No bile duct dilation. Pancreas: Unremarkable. No pancreatic ductal dilatation or surrounding inflammatory changes. Spleen: Normal in size without focal abnormality. Adrenals/Urinary Tract: Adrenal glands are unremarkable. Kidneys are normal, without renal calculi, focal lesion, or hydronephrosis. Bladder is unremarkable. Stomach/Bowel: There is mild dilation of the proximal to mid small bowel with a transition point in the central abdomen. Just to the left of the umbilicus, there is a knuckle of small bowel that slightly herniates through the anterior abdominal wall. Short portion of the small bowel distal to this is decompressed. The loops thin becomes mildly dilated where it joins an anastomosis staple line, in the central abdomen just below the level of the umbilicus. The bowel distal to the anastomosis staple line is mostly decompressed. There is no small bowel wall thickening or adjacent inflammation. Stomach is unremarkable. There is a bowel anastomosis staple line along the mid sigmoid colon. There are scattered diverticula along the colon superior to this. No diverticulitis or other colonic inflammatory process. Vascular/Lymphatic: Minor aortic atherosclerosis. Atherosclerotic plaque noted in the iliac arteries. No aneurysm. No pathologically enlarged lymph nodes. Reproductive: Unremarkable. Other: No ascites. Musculoskeletal: No fracture or acute finding. No osteoblastic or osteolytic lesions. IMPRESSION: 1. Partial small bowel obstruction. The transition point is in the low central abdomen just below the umbilicus. There are 2 adjacent areas of narrowing, 1 involving a small ventral hernia just lateral to the umbilicus and the other just below this, at the efferent side of a small  bowel anastomosis. 2. No evidence of bowel inflammation or ischemia. 3. No other acute abnormalities. 4. Hepatic steatosis. 5. Minor aortic  atherosclerosis. Electronically Signed   By: Amie Portland M.D.   On: 08/26/2017 17:32   Dg Abd 2 Views  Result Date: 08/27/2017 CLINICAL DATA:  Follow up small bowel obstruction EXAM: ABDOMEN - 2 VIEW COMPARISON:  08/26/2017 FINDINGS: Scattered large and small bowel gas is noted. The degree of small bowel dilatation has improved slightly in the interval from the prior exam although persistent air-fluid levels are seen. No free air is noted. Colonic gas is seen. IMPRESSION: Slight improvement in small bowel dilatation. Continued follow-up is recommended. Electronically Signed   By: Alcide Clever M.D.   On: 08/27/2017 12:23    Anti-infectives: Anti-infectives (From admission, onward)   None      Assessment/Plan: s/p * No surgery found * sbo seems to be partial. Stay on clears for now Repeat abd xray tomorrow Ambulate Will follow  LOS: 1 day    TOTH III, S 08/27/2017

## 2017-08-27 NOTE — Progress Notes (Signed)
PROGRESS NOTE [facility]   JAROM GOVAN   ZOX:[id number] DOB: [birth year]  DOA: 08/26/2017 PCP: Benita Stabile, MD   Brief Narrative:  [name] is a 52 year old male with medical history significant for multiple abdominal surgeries, hypertension, diabetes mellitus, obesity, alcohol abuse, alcoholic hepatitis, GERD and gout.  Patient presented to the emergency department complaining of abdominal pain associated with loose stool bowel movement.  Upon ED evaluation patient was found to have normal lipase, CT of the abdomen/pelvis showed partial small bowel obstruction with transition point in the lower central abdomen.  Patient is admitted with working diagnosis of small bowel obstruction and general surgery was consulted who recommended conservative treatment  Subjective: Patient seen and examined, abdominal pain has improved, passing flatus, had 2 bowel movement overnight.  Afebrile  Assessment & Plan: Partial small bowel obstruction General surgery recommending conservative management, abdominal x-ray from this morning showed slight improvement on bowel dilation.  Diet has been advanced to clear liquids.  Encourage ambulation and hydration.  Supportive management.   HTN  BP stable, continue home medications lisinopril and atenolol Monitor blood pressure closely  DM type II uncontrolled  Last A1c Register in the system is 7.8 Patient on Lantus, will hold while n.p.o. SSI and check A1c.  Alcohol abuse Discontinue CIWA, no signs of alcohol withdrawal and out of window at this point.  DVT prophylaxis: SCD/ambulation Code Status: Full code Family Communication: Wife at bedside Disposition Plan: Home when cleared by surgery  Consultants:   General surgery  Procedures:   None  Antimicrobials:  None   Objective: Vitals:   08/26/17 2158 08/27/17 0503 08/27/17 0917 08/27/17 1259  BP: (!) 144/96 (!) 150/94 (!) 165/97 (!) 161/99  Pulse: 88 74 85 74  Resp: Temp: 97.9 F (36.6 C) 98.1 F (36.7 C) 98.3 F (36.8 C) 98.6 F (37 C)  TempSrc: Oral Oral Oral Oral  SpO2: 97% 96% 97% 96%  Weight: 134.9 kg (297 lb 6.4 oz)     Height:  (1.803 m)       Intake/Output Summary (Last 24 hours) at 08/27/2017 1535 Last data filed at 08/27/2017 1400 Gross per 24 hour  Intake 2819.58 ml  Output -  Net 2819.58 ml   Filed Weights   08/26/17 1039 08/26/17 2158  Weight: 136.1 kg (300 lb) 134.9 kg (297 lb 6.4 oz)    Examination:  General exam: Appears calm and comfortable  HEENT: AC/AT, PERRLA, OP moist and clear Respiratory system: Clear to auscultation. No wheezes,crackle or rhonchi Cardiovascular system: S1 & S2 heard, RRR. No JVD, murmurs, rubs or gallops Gastrointestinal system: Abdomen slightly distended, mild diffuse tenderness.  Positive bowel sounds. Central nervous system: Alert and oriented. No focal neurological deficits. Extremities: No pedal edema. Symmetric, strength 5/5   Skin: No rashes, lesions or ulcers Psychiatry: Judgement and insight appear normal. Mood & affect appropriate.    Data Reviewed: I have personally reviewed following labs and imaging studies  CBC: Recent Labs  Lab 08/26/17 1136 08/27/17 0516  WBC 7.7 6.6  HGB 16.5 14.6  HCT 47.7 43.1  MCV 100.4* 101.7*  PLT 176 162   Basic Metabolic Panel: Recent Labs  Lab 08/26/17 1136 08/27/17 0516  NA 138 141  K 4.2 3.6  CL 106 107  CO2 21* 24  GLUCOSE 156* 81  BUN 15 14  CREATININE 0.95 0.72  CALCIUM 8.8* 8.5*   GFR: Estimated Creatinine Clearance: 148 mL/min (by C-G formula based  on SCr of 0.72 mg/dL). Liver Function Tests: Recent Labs  Lab 08/26/17 1136  AST 25  ALT 22  ALKPHOS 54  BILITOT 0.8  PROT 7.2  ALBUMIN 3.5   Recent Labs  Lab 08/26/17 1136  LIPASE 19   No results for input(s): AMMONIA in the last 168 hours. Coagulation Profile: Recent Labs  Lab 08/26/17 2005  INR 1.08   Cardiac Enzymes: No results for input(s):  CKTOTAL, CKMB, CKMBINDEX, TROPONINI in the last 168 hours. BNP (last 3 results) No results for input(s): PROBNP in the last 8760 hours. HbA1C: No results for input(s): HGBA1C in the last 72 hours. CBG: Recent Labs  Lab 08/26/17 2320 08/27/17 0735 08/27/17 1209  GLUCAP 101* 79 70   Lipid Profile: No results for input(s): CHOL, HDL, LDLCALC, TRIG, CHOLHDL, LDLDIRECT in the last 72 hours. Thyroid Function Tests: No results for input(s): TSH, T4TOTAL, FREET4, T3FREE, THYROIDAB in the last 72 hours. Anemia Panel: No results for input(s): VITAMINB12, FOLATE, FERRITIN, TIBC, IRON, RETICCTPCT in the last 72 hours. Sepsis Labs: No results for input(s): PROCALCITON, LATICACIDVEN in the last 168 hours.  No results found for this or any previous visit (from the past 240 hour(s)).    Radiology Studies: Ct Abdomen Pelvis W Contrast  Result Date: 08/26/2017 CLINICAL DATA:  Patient c/o mid abdominal pain and diarrhea x 2 days; hx multiple abd surgeries: ileostomy and reversal for diverticulitis EXAM: CT ABDOMEN AND PELVIS WITH CONTRAST TECHNIQUE: Multidetector CT imaging of the abdomen and pelvis was performed using the standard protocol following bolus administration of intravenous contrast. CONTRAST:  ISOVUE-300 IOPAMIDOL (ISOVUE-300) INJECTION 61% COMPARISON:  12/14/2012 FINDINGS: Lower chest: No acute abnormality. Hepatobiliary: Liver demonstrates decreased attenuation consistent with fatty infiltration. No liver mass or focal lesion. Gallbladder surgically absent. No bile duct dilation. Pancreas: Unremarkable. No pancreatic ductal dilatation or surrounding inflammatory changes. Spleen: Normal in size without focal abnormality. Adrenals/Urinary Tract: Adrenal glands are unremarkable. Kidneys are normal, without renal calculi, focal lesion, or hydronephrosis. Bladder is unremarkable. Stomach/Bowel: There is mild dilation of the proximal to mid small bowel with a transition point in the central  abdomen. Just to the left of the umbilicus, there is a knuckle of small bowel that slightly herniates through the anterior abdominal wall. Short portion of the small bowel distal to this is decompressed. The loops thin becomes mildly dilated where it joins an anastomosis staple line, in the central abdomen just below the level of the umbilicus. The bowel distal to the anastomosis staple line is mostly decompressed. There is no small bowel wall thickening or adjacent inflammation. Stomach is unremarkable. There is a bowel anastomosis staple line along the mid sigmoid colon. There are scattered diverticula along the colon superior to this. No diverticulitis or other colonic inflammatory process. Vascular/Lymphatic: Minor aortic atherosclerosis. Atherosclerotic plaque noted in the iliac arteries. No aneurysm. No pathologically enlarged lymph nodes. Reproductive: Unremarkable. Other: No ascites. Musculoskeletal: No fracture or acute finding. No osteoblastic or osteolytic lesions. IMPRESSION: 1. Partial small bowel obstruction. The transition point is in the low central abdomen just below the umbilicus. There are 2 adjacent areas of narrowing, 1 involving a small ventral hernia just lateral to the umbilicus and the other just below this, at the efferent side of a small bowel anastomosis. 2. No evidence of bowel inflammation or ischemia. 3. No other acute abnormalities. 4. Hepatic steatosis. 5. Minor aortic atherosclerosis. Electronically Signed   By: Amie Portland M.D.   On: 08/26/2017 17:32   Dg Abd  2 Views  Result Date: 08/27/2017 CLINICAL DATA:  Follow up small bowel obstruction EXAM: ABDOMEN - 2 VIEW COMPARISON:  08/26/2017 FINDINGS: Scattered large and small bowel gas is noted. The degree of small bowel dilatation has improved slightly in the interval from the prior exam although persistent air-fluid levels are seen. No free air is noted. Colonic gas is seen. IMPRESSION: Slight improvement in small bowel  dilatation. Continued follow-up is recommended. Electronically Signed   By: Alcide Clever M.D.   On: 08/27/2017 12:23      Scheduled Meds: . allopurinol  300 mg Oral Daily  . atenolol  100 mg Oral Daily  . folic acid  1 mg Oral Daily  . insulin aspart  0-9 Units Subcutaneous TID WC  . lisinopril  5 mg Oral Daily  . LORazepam  0-4 mg Intravenous Q6H   Followed by  . [START ON 08/28/2017] LORazepam  0-4 mg Intravenous Q12H  . meloxicam  15 mg Oral Daily  . multivitamin with minerals  1 tablet Oral Daily  . ondansetron (ZOFRAN) IV  4 mg Intravenous Once  . pravastatin  20 mg Oral q1800  . thiamine  100 mg Oral Daily   Or  . thiamine  100 mg Intravenous Daily   Continuous Infusions: . sodium chloride 125 mL/hr at 08/27/17 0947  . famotidine (PEPCID) IV Stopped (08/27/17 0947)     LOS: 1 day    Time spent: Total of 25 minutes spent with pt, greater than 50% of which was spent in discussion of  treatment, counseling and coordination of care    Latrelle Dodrill, MD Pager: Text Page via [email or web address]   If 7PM-7AM, please contact night-coverage [email or web address] 08/27/2017, 3:35 PM   Note - This record has been created using [employer]. Chart creation errors have been sought, but may not always have been located. Such creation errors do not reflect on the standard of medical care.

## 2017-08-28 ENCOUNTER — Inpatient Hospital Stay (HOSPITAL_COMMUNITY): Payer: BLUE CROSS/BLUE SHIELD

## 2017-08-28 DIAGNOSIS — I10 Essential (primary) hypertension: Secondary | ICD-10-CM

## 2017-08-28 DIAGNOSIS — K56600 Partial intestinal obstruction, unspecified as to cause: Principal | ICD-10-CM

## 2017-08-28 DIAGNOSIS — E118 Type 2 diabetes mellitus with unspecified complications: Secondary | ICD-10-CM

## 2017-08-28 DIAGNOSIS — E1165 Type 2 diabetes mellitus with hyperglycemia: Secondary | ICD-10-CM

## 2017-08-28 DIAGNOSIS — F1010 Alcohol abuse, uncomplicated: Secondary | ICD-10-CM

## 2017-08-28 LAB — CBC WITH DIFFERENTIAL/PLATELET
BASOS ABS: 0 10*3/uL (ref 0.0–0.1)
BASOS PCT: 0 %
Eosinophils Absolute: 0.1 10*3/uL (ref 0.0–0.7)
Eosinophils Relative: 1 %
HEMATOCRIT: 39.3 % (ref 39.0–52.0)
HEMOGLOBIN: 13.5 g/dL (ref 13.0–17.0)
LYMPHS PCT: 37 %
Lymphs Abs: 2.5 10*3/uL (ref 0.7–4.0)
MCH: 34.6 pg — ABNORMAL HIGH (ref 26.0–34.0)
MCHC: 34.4 g/dL (ref 30.0–36.0)
MCV: 100.8 fL — AB (ref 78.0–100.0)
MONO ABS: 0.9 10*3/uL (ref 0.1–1.0)
Monocytes Relative: 13 %
NEUTROS ABS: 3.2 10*3/uL (ref 1.7–7.7)
NEUTROS PCT: 49 %
Platelets: 149 10*3/uL — ABNORMAL LOW (ref 150–400)
RBC: 3.9 MIL/uL — ABNORMAL LOW (ref 4.22–5.81)
RDW: 12.7 % (ref 11.5–15.5)
WBC: 6.6 10*3/uL (ref 4.0–10.5)

## 2017-08-28 LAB — BASIC METABOLIC PANEL
Anion gap: 7 (ref 5–15)
BUN: 9 mg/dL (ref 6–20)
CO2: 24 mmol/L (ref 22–32)
Calcium: 8.2 mg/dL — ABNORMAL LOW (ref 8.9–10.3)
Chloride: 107 mmol/L (ref 101–111)
Creatinine, Ser: 0.69 mg/dL (ref 0.61–1.24)
GLUCOSE: 96 mg/dL (ref 65–99)
POTASSIUM: 3.9 mmol/L (ref 3.5–5.1)
Sodium: 138 mmol/L (ref 135–145)

## 2017-08-28 LAB — GLUCOSE, CAPILLARY
GLUCOSE-CAPILLARY: 87 mg/dL (ref 65–99)
GLUCOSE-CAPILLARY: 117 mg/dL — AB (ref 65–99)

## 2017-08-28 LAB — MAGNESIUM: Magnesium: 1.4 mg/dL — ABNORMAL LOW (ref 1.7–2.4)

## 2017-08-28 MED ORDER — FOLIC ACID 1 MG PO TABS: 1.0000 mg | ORAL_TABLET | Freq: Every day | ORAL | 0 refills | Status: DC

## 2017-08-28 MED ORDER — POLYETHYLENE GLYCOL 3350 17 G PO PACK: 17.0000 g | PACK | Freq: Every day | ORAL | 0 refills | Status: DC | PRN

## 2017-08-28 MED ORDER — DOCUSATE SODIUM 100 MG PO CAPS
100.0000 mg | ORAL_CAPSULE | Freq: Every day | ORAL | Status: DC
  Administered 2017-08-28: 100 mg via ORAL
  Filled 2017-08-28: qty 1

## 2017-08-28 MED ORDER — MAGNESIUM SULFATE 2 GM/50ML IV SOLN
2.0000 g | Freq: Once | INTRAVENOUS | Status: AC
  Administered 2017-08-28: 2 g via INTRAVENOUS
  Filled 2017-08-28: qty 50

## 2017-08-28 MED ORDER — TRAMADOL HCL 50 MG PO TABS: 50.0000 mg | ORAL_TABLET | Freq: Four times a day (QID) | ORAL | 0 refills | Status: AC | PRN

## 2017-08-28 MED ORDER — ONDANSETRON HCL 4 MG PO TABS: 4.0000 mg | ORAL_TABLET | Freq: Every day | ORAL | 0 refills | Status: DC | PRN

## 2017-08-28 MED ORDER — DOCUSATE SODIUM 100 MG PO CAPS: 100.0000 mg | ORAL_CAPSULE | Freq: Every day | ORAL | 0 refills | Status: DC

## 2017-08-28 MED ORDER — THIAMINE HCL 100 MG PO TABS: 100.0000 mg | ORAL_TABLET | Freq: Every day | ORAL | 0 refills | Status: DC

## 2017-08-28 NOTE — Progress Notes (Signed)
D/C instructions reviewed w/ pt and wife. Both verbalize understanding and all questions answered. Pt d/c in w/c in stable condition by NT to wife's car. Pt in possession of d/c packet and all personal belongings.

## 2017-08-28 NOTE — Discharge Summary (Signed)
Physician Discharge Summary  TANNOR PYON HQI:[id number] DOB: July 13, 1965 DOA: 08/26/2017  PCP: Benita Stabile, MD  Admit date: 08/26/2017 Discharge date: 08/28/2017  Admitted From: home Disposition:  Home  Recommendations for Outpatient Follow-up:  1. Follow up with PCP in 1 week 2. Follow-up with general surgery as an outpatient  Home Health: No Equipment/Devices: None  Discharge Condition: Stable CODE STATUS: Full Diet recommendation: Heart Healthy/soft diet  Brief/Interim Summary: 52 year old male with history of multiple abdominal surgeries, hypertension, diabetes mellitus, obesity, alcoholic abuse, alcohol hepatitis, GERD and gout presented on 08/26/2017 with abdominal pain.  He was found to have partial small bowel obstruction on CT of the abdomen/pelvis.  General surgery was consulted.  He was initially kept n.p.o. and started on intravenous fluids.  His condition improved.  He started passing gas and having bowel movements.  He was started on clear liquid diet which was advanced.  If he tolerates soft diet today, he will be discharged home.  Discharge Diagnoses:  Principal Problem:   Partial small bowel obstruction (HCC) Active Problems:   GOUT   Morbid obesity (HCC)   Tobacco abuse   Essential hypertension   Alcohol abuse   Diabetes mellitus type 2, uncontrolled, with complications (HCC)   GERD (gastroesophageal reflux disease)  Partial small bowel obstruction -General surgery consulted.  Initially was kept n.p.o. with IV fluids.  Started passing gas and moving bowel.  If patient tolerates soft diet today, he will be discharged home.  General surgery has cleared the patient for discharge.  Continue soft diet.  Outpatient follow-up with general surgery.  Continue Colace and as needed MiraLAX at home.  HTN  BP stable, continue home medications lisinopril and atenolol Outpatient follow-up  DM type II uncontrolled  Continue outpatient regimen.  Alcohol abuse  no signs of  alcohol withdrawal. -Continue multivitamin, thiamine and folate.  Outpatient follow-up   Discharge Instructions  Discharge Instructions    Call MD for:  difficulty breathing, headache or visual disturbances   Complete by:  As directed    Call MD for:  extreme fatigue   Complete by:  As directed    Call MD for:  hives   Complete by:  As directed    Call MD for:  persistant dizziness or light-headedness   Complete by:  As directed    Call MD for:  persistant nausea and vomiting   Complete by:  As directed    Call MD for:  severe uncontrolled pain   Complete by:  As directed    Call MD for:  temperature >100.4   Complete by:  As directed    Diet - low sodium heart healthy   Complete by:  As directed    Discharge instructions   Complete by:  As directed    Soft diet   Increase activity slowly   Complete by:  As directed      Allergies as of 08/28/2017      Reactions   Hydrocodone Itching      Medication List    TAKE these medications   acetaminophen 500 MG tablet Commonly known as:  TYLENOL Take 1,000 mg by mouth daily.   allopurinol 300 MG tablet Commonly known as:  ZYLOPRIM Take 300 mg by mouth daily.   atenolol 100 MG tablet Commonly known as:  TENORMIN Take 100 mg by mouth daily.   CALCIUM-MAGNESIUM-ZINC-D3 PO Take 1 tablet by mouth daily.   docusate sodium 100 MG capsule Commonly known as:  COLACE Take 1  capsule (100 mg total) by mouth daily.   folic acid 1 MG tablet Commonly known as:  FOLVITE Take 1 tablet (1 mg total) by mouth daily. Start taking on:  08/29/2017   lisinopril 5 MG tablet Commonly known as:  PRINIVIL,ZESTRIL Take 5 mg by mouth daily.   meloxicam 15 MG tablet Commonly known as:  MOBIC Take 15 mg by mouth daily.   omeprazole 20 MG capsule Commonly known as:  PRILOSEC Take 20 mg by mouth daily.   ondansetron 4 MG tablet Commonly known as:  ZOFRAN Take 1 tablet (4 mg total) by mouth daily as needed for nausea or vomiting.    polyethylene glycol packet Commonly known as:  MIRALAX Take 17 g by mouth daily as needed for moderate constipation.   pravastatin 20 MG tablet Commonly known as:  PRAVACHOL Take 20 mg by mouth daily.   thiamine 100 MG tablet Take 1 tablet (100 mg total) by mouth daily. Start taking on:  08/29/2017   TOUJEO MAX SOLOSTAR 300 UNIT/ML Sopn Generic drug:  Insulin Glargine Inject 110 Units into the skin daily.   traMADol 50 MG tablet Commonly known as:  ULTRAM Take 1 tablet (50 mg total) by mouth every 6 (six) hours as needed for moderate pain.      Follow-up Information    Karie Soda, MD Follow up.   Specialty:  General Surgery Why:  Call to follow up for recurrent hernia  Contact information: [address] [place] [address] [phone]        Benita Stabile, MD. Schedule an appointment as soon as possible for a visit in 1 week(s).   Specialty:  Internal Medicine [facility] [facility] [address]          Allergies  Allergen Reactions  . Hydrocodone Itching    Consultations:  General surgery   Procedures/Studies: Ct Abdomen Pelvis W Contrast  Result Date: 08/26/2017 CLINICAL DATA:  Patient c/o mid abdominal pain and diarrhea x 2 days; hx multiple abd surgeries: ileostomy and reversal for diverticulitis EXAM: CT ABDOMEN AND PELVIS WITH CONTRAST TECHNIQUE: Multidetector CT imaging of the abdomen and pelvis was performed using the standard protocol following bolus administration of intravenous contrast. CONTRAST:  ISOVUE-300 IOPAMIDOL (ISOVUE-300) INJECTION 61% COMPARISON:  12/14/2012 FINDINGS: Lower chest: No acute abnormality. Hepatobiliary: Liver demonstrates decreased attenuation consistent with fatty infiltration. No liver mass or focal lesion. Gallbladder surgically absent. No bile duct dilation. Pancreas: Unremarkable. No pancreatic ductal dilatation or surrounding inflammatory changes. Spleen: Normal in size without focal  abnormality. Adrenals/Urinary Tract: Adrenal glands are unremarkable. Kidneys are normal, without renal calculi, focal lesion, or hydronephrosis. Bladder is unremarkable. Stomach/Bowel: There is mild dilation of the proximal to mid small bowel with a transition point in the central abdomen. Just to the left of the umbilicus, there is a knuckle of small bowel that slightly herniates through the anterior abdominal wall. Short portion of the small bowel distal to this is decompressed. The loops thin becomes mildly dilated where it joins an anastomosis staple line, in the central abdomen just below the level of the umbilicus. The bowel distal to the anastomosis staple line is mostly decompressed. There is no small bowel wall thickening or adjacent inflammation. Stomach is unremarkable. There is a bowel anastomosis staple line along the mid sigmoid colon. There are scattered diverticula along the colon superior to this. No diverticulitis or other colonic inflammatory process. Vascular/Lymphatic: Minor aortic atherosclerosis. Atherosclerotic plaque noted in the iliac arteries. No aneurysm.  No pathologically enlarged lymph nodes. Reproductive: Unremarkable. Other: No ascites. Musculoskeletal: No fracture or acute finding. No osteoblastic or osteolytic lesions. IMPRESSION: 1. Partial small bowel obstruction. The transition point is in the low central abdomen just below the umbilicus. There are 2 adjacent areas of narrowing, 1 involving a small ventral hernia just lateral to the umbilicus and the other just below this, at the efferent side of a small bowel anastomosis. 2. No evidence of bowel inflammation or ischemia. 3. No other acute abnormalities. 4. Hepatic steatosis. 5. Minor aortic atherosclerosis. Electronically Signed   By: Amie Portland M.D.   On: 08/26/2017 17:32   Dg Abd 2 Views  Result Date: 08/27/2017 CLINICAL DATA:  Follow up small bowel obstruction EXAM: ABDOMEN - 2 VIEW COMPARISON:  08/26/2017 FINDINGS:  Scattered large and small bowel gas is noted. The degree of small bowel dilatation has improved slightly in the interval from the prior exam although persistent air-fluid levels are seen. No free air is noted. Colonic gas is seen. IMPRESSION: Slight improvement in small bowel dilatation. Continued follow-up is recommended. Electronically Signed   By: Alcide Clever M.D.   On: 08/27/2017 12:23   Dg Abd Portable 1v  Result Date: 08/28/2017 CLINICAL DATA:  Small-bowel obstruction, follow-up EXAM: PORTABLE ABDOMEN - 1 VIEW COMPARISON:  Portable exam 0853 hours compared to 08/27/2017 FINDINGS: Scattered gas in colon, increased. Decreased small bowel distention since previous exam. No definite bowel wall thickening. Surgical clips RIGHT upper quadrant from cholecystectomy. No acute osseous findings. IMPRESSION: Decreased small bowel distention and increased colonic gas since prior study. Electronically Signed   By: Ulyses Southward M.D.   On: 08/28/2017 10:49      Subjective: Seen and examined at bedside.  He feels better and wants to advance his diet and go home.  No overnight fever, nausea or vomiting.  He had bowel movement last night.  He is passing gas.  Discharge Exam: Vitals:   08/27/17 2247 08/28/17 0642  BP: (!) 169/93 (!) 168/101  Pulse: 77 79  Resp:  18  Temp:  98 F (36.7 C)  SpO2:  98%   Vitals:   08/27/17 1259 08/27/17 2151 08/27/17 2247 08/28/17 0642  BP: (!) 161/99 (!) 167/122 (!) 169/93 (!) 168/101  Pulse: 74 90 77 79  Resp: Temp: 98.6 F (37 C) 98 F (36.7 C)  98 F (36.7 C)  TempSrc: Oral Oral  Oral  SpO2: 96% 99%  98%  Weight:      Height:        General: Pt is alert, awake, not in acute distress Cardiovascular: Rate controlled, S1/S2 + Respiratory: Bilateral decreased breath sounds at bases  abdominal: Soft, NT, ND, bowel sounds + Extremities: no edema, no cyanosis    The results of significant diagnostics from this hospitalization (including imaging,  microbiology, ancillary and laboratory) are listed below for reference.     Microbiology: No results found for this or any previous visit (from the past 240 hour(s)).   Labs: BNP (last 3 results) No results for input(s): BNP in the last 8760 hours. Basic Metabolic Panel: Recent Labs  Lab 08/26/17 1136 08/27/17 0516 08/28/17 0514  NA 138 141 138  K 4.2 3.6 3.9  CL 106 107 107  CO2 21* 24 24  GLUCOSE 156* 81 96  BUN CREATININE 0.95 0.72 0.69  CALCIUM 8.8* 8.5* 8.2*  MG  --   --  1.4*   Liver Function Tests:  Recent Labs  Lab 08/26/17 1136  AST 25  ALT 22  ALKPHOS 54  BILITOT 0.8  PROT 7.2  ALBUMIN 3.5   Recent Labs  Lab 08/26/17 1136  LIPASE 19   No results for input(s): AMMONIA in the last 168 hours. CBC: Recent Labs  Lab 08/26/17 1136 08/27/17 0516 08/28/17 0514  WBC 7.7 6.6 6.6  NEUTROABS  --   --  3.2  HGB 16.5 14.6 13.5  HCT 47.7 43.1 39.3  MCV 100.4* 101.7* 100.8*  PLT 176 162 149*   Cardiac Enzymes: No results for input(s): CKTOTAL, CKMB, CKMBINDEX, TROPONINI in the last 168 hours. BNP: Invalid input(s): POCBNP CBG: Recent Labs  Lab 08/27/17 0735 08/27/17 1209 08/27/17 1636 08/27/17 2155 08/28/17 0736  GLUCAP 79 70 103* 138* 87   D-Dimer No results for input(s): DDIMER in the last 72 hours. Hgb A1c No results for input(s): HGBA1C in the last 72 hours. Lipid Profile No results for input(s): CHOL, HDL, LDLCALC, TRIG, CHOLHDL, LDLDIRECT in the last 72 hours. Thyroid function studies No results for input(s): TSH, T4TOTAL, T3FREE, THYROIDAB in the last 72 hours.  Invalid input(s): FREET3 Anemia work up No results for input(s): VITAMINB12, FOLATE, FERRITIN, TIBC, IRON, RETICCTPCT in the last 72 hours. Urinalysis    Component Value Date/Time   COLORURINE AMBER (A) 08/26/2017 1454   APPEARANCEUR HAZY (A) 08/26/2017 1454   LABSPEC >1.046 (H) 08/26/2017 1454   PHURINE 5.0 08/26/2017 1454   GLUCOSEU NEGATIVE 08/26/2017 1454    HGBUR NEGATIVE 08/26/2017 1454   BILIRUBINUR SMALL (A) 08/26/2017 1454   KETONESUR 5 (A) 08/26/2017 1454   PROTEINUR >=300 (A) 08/26/2017 1454   UROBILINOGEN 0.2 12/14/2012 2154   NITRITE NEGATIVE 08/26/2017 1454   LEUKOCYTESUR NEGATIVE 08/26/2017 1454   Sepsis Labs Invalid input(s): PROCALCITONIN,  WBC,  LACTICIDVEN Microbiology No results found for this or any previous visit (from the past 240 hour(s)).   Time coordinating discharge: 35 minutes  SIGNED:   Glade Lloyd, MD  [facility] 08/28/2017, 11:17 AM Pager: [phone]  If 7PM-7AM, please contact night-coverage [email or web address] Password [email or web address]

## 2017-08-28 NOTE — Plan of Care (Signed)
  Problem: Health Behavior/Discharge Planning: Goal: Ability to manage health-related needs will improve 08/28/2017 1333 by Kerrin Mo, RN Outcome: Completed/Met 08/28/2017 1017 by Kerrin Mo, RN Outcome: Progressing   Problem: Clinical Measurements: Goal: Ability to maintain clinical measurements within normal limits will improve 08/28/2017 1333 by Kerrin Mo, RN Outcome: Completed/Met 08/28/2017 1017 by Kerrin Mo, RN Outcome: Progressing Goal: Diagnostic test results will improve 08/28/2017 1333 by Kerrin Mo, RN Outcome: Completed/Met 08/28/2017 1017 by Kerrin Mo, RN Outcome: Progressing Note:  Await results of abd x ray this AM.    Problem: Nutrition: Goal: Adequate nutrition will be maintained 08/28/2017 1333 by Kerrin Mo, RN Outcome: Completed/Met 08/28/2017 1017 by Kerrin Mo, RN Outcome: Progressing Note:  Tolerating clear liquids. Further advancement pending abd x ray results.    Problem: Elimination: Goal: Will not experience complications related to bowel motility 08/28/2017 1333 by Kerrin Mo, RN Outcome: Completed/Met 08/28/2017 1017 by Kerrin Mo, RN Outcome: Progressing Note:  +flatus, +BM, +BS. No n/v.    Problem: Pain Managment: Goal: General experience of comfort will improve 08/28/2017 1333 by Kerrin Mo, RN Outcome: Completed/Met 08/28/2017 1017 by Kerrin Mo, RN Outcome: Progressing   Problem: Safety: Goal: Ability to remain free from injury will improve Outcome: Completed/Met

## 2017-08-28 NOTE — Progress Notes (Signed)
[facility] Progress Note     Subjective: CC: Wants to go home Patient denies abdominal pain, distention, n/v. Passing flatus and had a liquid BM yesterday. Tolerating liquid diet and walking plenty.   Objective: Vital signs in last 24 hours: Temp:  [98 F (36.7 C)-98.6 F (37 C)] 98 F (36.7 C) (05/08 5409) Pulse Rate:  [74-90] 79 (05/08 0642) Resp:  [16-20] 18 (05/08 0642) BP: (161-169)/(93-122) 168/101 (05/08 0642) SpO2:  [96 %-99 %] 98 % (05/08 0642) Last BM Date: 08/28/17  Intake/Output from previous day: 05/07 0701 - 05/08 0700 In: 2160.4 [P.O.:600; I.V.:1510.4; IV Piggyback:50] Out: -  Intake/Output this shift: Total I/O In: 680 [P.O.:480; I.V.:200] Out: -   PE: Gen:  Alert, NAD, pleasant Card:  Regular rate and rhythm, pedal pulses 2+ BL Pulm:  Normal effort, clear to auscultation bilaterally Abd: Soft, non-tender, non-distended, bowel sounds present Skin: warm and dry, no rashes  Psych: A&Ox3   Lab Results:  Recent Labs    08/27/17 0516 08/28/17 0514  WBC 6.6 6.6  HGB 14.6 13.5  HCT 43.1 39.3  PLT 162 149*   BMET Recent Labs    08/27/17 0516 08/28/17 0514  NA 141 138  K 3.6 3.9  CL 107 107  CO2 24 24  GLUCOSE 81 96  BUN 14 9  CREATININE 0.72 0.69  CALCIUM 8.5* 8.2*   PT/INR Recent Labs    08/26/17 2005  LABPROT 13.9  INR 1.08   CMP     Component Value Date/Time   NA 138 08/28/2017 0514   K 3.9 08/28/2017 0514   CL 107 08/28/2017 0514   CO2 24 08/28/2017 0514   GLUCOSE 96 08/28/2017 0514   BUN 9 08/28/2017 0514   CREATININE 0.69 08/28/2017 0514   CALCIUM 8.2 (L) 08/28/2017 0514   PROT 7.2 08/26/2017 1136   ALBUMIN 3.5 08/26/2017 1136   AST 25 08/26/2017 1136   ALT 22 08/26/2017 1136   ALKPHOS 54 08/26/2017 1136   BILITOT 0.8 08/26/2017 1136   GFRNONAA >60 08/28/2017 0514   GFRAA >60 08/28/2017 0514   Lipase     Component Value Date/Time   LIPASE 19 08/26/2017 1136       Studies/Results: Ct Abdomen  Pelvis W Contrast  Result Date: 08/26/2017 CLINICAL DATA:  Patient c/o mid abdominal pain and diarrhea x 2 days; hx multiple abd surgeries: ileostomy and reversal for diverticulitis EXAM: CT ABDOMEN AND PELVIS WITH CONTRAST TECHNIQUE: Multidetector CT imaging of the abdomen and pelvis was performed using the standard protocol following bolus administration of intravenous contrast. CONTRAST:  ISOVUE-300 IOPAMIDOL (ISOVUE-300) INJECTION 61% COMPARISON:  12/14/2012 FINDINGS: Lower chest: No acute abnormality. Hepatobiliary: Liver demonstrates decreased attenuation consistent with fatty infiltration. No liver mass or focal lesion. Gallbladder surgically absent. No bile duct dilation. Pancreas: Unremarkable. No pancreatic ductal dilatation or surrounding inflammatory changes. Spleen: Normal in size without focal abnormality. Adrenals/Urinary Tract: Adrenal glands are unremarkable. Kidneys are normal, without renal calculi, focal lesion, or hydronephrosis. Bladder is unremarkable. Stomach/Bowel: There is mild dilation of the proximal to mid small bowel with a transition point in the central abdomen. Just to the left of the umbilicus, there is a knuckle of small bowel that slightly herniates through the anterior abdominal wall. Short portion of the small bowel distal to this is decompressed. The loops thin becomes mildly dilated where it joins an anastomosis staple line, in the central abdomen just below the level of the umbilicus. The bowel distal to the anastomosis  staple line is mostly decompressed. There is no small bowel wall thickening or adjacent inflammation. Stomach is unremarkable. There is a bowel anastomosis staple line along the mid sigmoid colon. There are scattered diverticula along the colon superior to this. No diverticulitis or other colonic inflammatory process. Vascular/Lymphatic: Minor aortic atherosclerosis. Atherosclerotic plaque noted in the iliac arteries. No aneurysm. No pathologically  enlarged lymph nodes. Reproductive: Unremarkable. Other: No ascites. Musculoskeletal: No fracture or acute finding. No osteoblastic or osteolytic lesions. IMPRESSION: 1. Partial small bowel obstruction. The transition point is in the low central abdomen just below the umbilicus. There are 2 adjacent areas of narrowing, 1 involving a small ventral hernia just lateral to the umbilicus and the other just below this, at the efferent side of a small bowel anastomosis. 2. No evidence of bowel inflammation or ischemia. 3. No other acute abnormalities. 4. Hepatic steatosis. 5. Minor aortic atherosclerosis. Electronically Signed   By: Amie Portland M.D.   On: 08/26/2017 17:32   Dg Abd 2 Views  Result Date: 08/27/2017 CLINICAL DATA:  Follow up small bowel obstruction EXAM: ABDOMEN - 2 VIEW COMPARISON:  08/26/2017 FINDINGS: Scattered large and small bowel gas is noted. The degree of small bowel dilatation has improved slightly in the interval from the prior exam although persistent air-fluid levels are seen. No free air is noted. Colonic gas is seen. IMPRESSION: Slight improvement in small bowel dilatation. Continued follow-up is recommended. Electronically Signed   By: Alcide Clever M.D.   On: 08/27/2017 12:23    Anti-infectives: Anti-infectives (From admission, onward)   None       Assessment/Plan PAF HTN HLD T2DM  Recurrent umbilical hernia PSBO - KUB this AM with some improvement in overall distention - final read still pending - having BMs - advance to soft diet and ok to d/c from our standpoint if tolerating - no sign of incarcerated hernia on my exam - recommend following soft diet and stool softener at home with prn miralax; recommend follow up with Dr. Michaell Cowing for recurrent hernia - may ultimately need referral to tertiary center given extensive abdominal surgeries and should be advised to lose weight  FEN: soft  VTE: SCDs ID: no current abx    LOS: 2 days    Wells Guiles ,  [facility] 08/28/2017, 10:34 AM Pager: [phone] Consults: [phone] Mon-Fri 7:00 am-4:30 pm Sat-Sun 7:00 am-11:30 am

## 2017-08-28 NOTE — Plan of Care (Signed)
  Problem: Health Behavior/Discharge Planning: Goal: Ability to manage health-related needs will improve Outcome: Progressing   Problem: Clinical Measurements: Goal: Ability to maintain clinical measurements within normal limits will improve Outcome: Progressing Goal: Diagnostic test results will improve Outcome: Progressing Note:  Await results of abd x ray this AM.    Problem: Nutrition: Goal: Adequate nutrition will be maintained Outcome: Progressing Note:  Tolerating clear liquids. Further advancement pending abd x ray results.    Problem: Elimination: Goal: Will not experience complications related to bowel motility Outcome: Progressing Note:  +flatus, +BM, +BS. No n/v.    Problem: Pain Managment: Goal: General experience of comfort will improve Outcome: Progressing

## 2017-08-28 NOTE — Discharge Instructions (Signed)
Small Bowel Obstruction A small bowel obstruction is a blockage in the small bowel. The small bowel, which is also called the small intestine, is a long, slender tube that connects the stomach to the colon. When a person eats and drinks, food and fluids go from the stomach to the small bowel. This is where most of the nutrients in the food and fluids are absorbed. A small bowel obstruction will prevent food and fluids from passing through the small bowel as they normally do during digestion. The small bowel can become partially or completely blocked. This can cause symptoms such as abdominal pain, vomiting, and bloating. If this condition is not treated, it can be dangerous because the small bowel could rupture. What are the causes? Common causes of this condition include:  Scar tissue from previous surgery or radiation treatment.  Recent surgery. This may cause the movements of the bowel to slow down and cause food to block the intestine.  Hernias.  Inflammatory bowel disease (colitis).  Twisting of the bowel (volvulus).  Tumors.  A foreign body.  Slipping of a part of the bowel into another part (intussusception).  What are the signs or symptoms? Symptoms of this condition include:  Abdominal pain. This may be dull cramps or sharp pain. It may occur in one area, or it may be present in the entire abdomen. Pain can range from mild to severe, depending on the degree of obstruction.  Nausea and vomiting. Vomit may be greenish or a yellow bile color.  Abdominal bloating.  Constipation.  Lack of passing gas.  Frequent belching.  Diarrhea. This may occur if the obstruction is partial and runny stool is able to leak around the obstruction.  How is this diagnosed? This condition may be diagnosed based on a physical exam, medical history, and X-rays of the abdomen. You may also have other tests, such as a CT scan of the abdomen and pelvis. How is this treated? Treatment for  this condition depends on the cause and severity of the problem. Treatment options may include:  Bed rest along with fluids and pain medicines that are given through an IV tube inserted into one of your veins. Sometimes, this is all that is needed for the obstruction to improve.  Following a simple diet. In some cases, a clear liquid diet may be required for several days. This allows the bowel to rest.  Placement of a small tube (nasogastric tube) into the stomach. When the bowel is blocked, it usually swells up like a balloon that is filled with air and fluids. The air and fluids may be removed by suction through the nasogastric tube. This can help with pain, discomfort, and nausea. It can also help the obstruction to clear up faster.  Surgery. This may be required if other treatments do not work. Bowel obstruction from a hernia may require early surgery and can be an emergency procedure. Surgery may also be required for scar tissue that causes frequent or severe obstructions.  Follow these instructions at home:  Get plenty of rest.  Follow instructions from your health care provider about eating restrictions. You may need to avoid solid foods and consume only clear liquids until your condition improves.  Take over-the-counter and prescription medicines only as told by your health care provider.  Keep all follow-up visits as told by your health care provider. This is important. Contact a health care provider if:  You have a fever.  You have chills. Get help right away if:  You have increased pain or cramping.  You vomit blood.  You have uncontrolled vomiting or nausea.  You cannot drink fluids because of vomiting or pain.  You develop confusion.  You begin feeling very dry or thirsty (dehydrated).  You have severe bloating.  You feel extremely weak or you faint. This information is not intended to replace advice given to you by your health care provider. Make sure you  discuss any questions you have with your health care provider. Document Released: 06/26/2005 Document Revised: 12/05/2015 Document Reviewed: 06/03/2014 Elsevier Interactive Patient Education  2018 [employer]. Soft-Food Meal Plan A soft-food meal plan includes foods that are safe and easy to swallow. This meal plan typically is used:  If you are having trouble chewing or swallowing foods.  As a transition meal plan after only having had liquid meals for a long period.  What do I need to know about the soft-food meal plan? A soft-food meal plan includes tender foods that are soft and easy to chew and swallow. In most cases, bite-sized pieces of food are easier to swallow. A bite-sized piece is about  inch or smaller. Foods in this plan do not need to be ground or pureed. Foods that are very hard, crunchy, or sticky should be avoided. Also, breads, cereals, yogurts, and desserts with nuts, seeds, or fruits should be avoided. What foods can I eat? Grains Rice and wild rice. Moist bread, dressing, pasta, and noodles. Well-moistened dry or cooked cereals, such as farina (cooked wheat cereal), oatmeal, or grits. Biscuits, breads, muffins, pancakes, and waffles that have been well moistened. Vegetables Shredded lettuce. Cooked, tender vegetables, including potatoes without skins. Vegetable juices. Broths or creamed soups made with vegetables that are not stringy or chewy. Strained tomatoes (without seeds). Fruits Canned or well-cooked fruits. Soft (ripe), peeled fresh fruits, such as peaches, nectarines, kiwi, cantaloupe, honeydew melon, and watermelon (without seeds). Soft berries with small seeds, such as strawberries. Fruit juices (without pulp). Meats and Other Protein Sources Moist, tender, lean beef. Mutton. Lamb. Veal. Chicken. [place]. Liver. Ham. Fish without bones. Eggs. Dairy Milk, milk drinks, and cream. Plain cream cheese and cottage cheese. Plain yogurt. Sweets/Desserts Flavored  gelatin desserts. Custard. Plain ice cream, frozen yogurt, sherbet, milk shakes, and malts. Plain cakes and cookies. Plain hard candy. Other Butter, margarine (without trans fat), and cooking oils. Mayonnaise. Cream sauces. Mild spices, salt, and sugar. Syrup, molasses, honey, and jelly. The items listed above may not be a complete list of recommended foods or beverages. Contact your dietitian for more options. What foods are not recommended? Grains Dry bread, toast, crackers that have not been moistened. Coarse or dry cereals, such as bran, granola, and shredded wheat. Tough or chewy crusty breads, such as [place] bread or baguettes. Vegetables Corn. Raw vegetables except shredded lettuce. Cooked vegetables that are tough or stringy. Tough, crisp, fried potatoes and potato skins. Fruits Fresh fruits with skins or seeds or both, such as apples, pears, or grapes. Stringy, high-pulp fruits, such as papaya, pineapple, coconut, or mango. Fruit leather, fruit roll-ups, and all dried fruits. Meats and Other Protein Sources Sausages and hot dogs. Meats with gristle. Fish with bones. Nuts, seeds, and chunky peanut or other nut butters. Sweets/Desserts Cakes or cookies that are very dry or chewy. The items listed above may not be a complete list of foods and beverages to avoid. Contact your dietitian for more information. This information is not intended to replace advice given to you by your health care provider.  Make sure you discuss any questions you have with your health care provider. Document Released: 07/17/2007 Document Revised: 09/15/2015 Document Reviewed: 03/06/2013 Elsevier Interactive Patient Education  2017 [employer].  Recommend daily colace and miralax as needed to keep bowels regular. You may cut back to M/W/F colace if having loose stools.

## 2017-09-29 ENCOUNTER — Emergency Department (HOSPITAL_COMMUNITY)
Admission: EM | Admit: 2017-09-29 | Discharge: 2017-09-29 | Disposition: A | Discharge status: Home / Self Care | Payer: BLUE CROSS/BLUE SHIELD | Attending: Emergency Medicine | Admitting: Emergency Medicine

## 2017-09-29 ENCOUNTER — Other Ambulatory Visit: Payer: Self-pay

## 2017-09-29 DIAGNOSIS — M25551 Pain in right hip: Secondary | ICD-10-CM | POA: Insufficient documentation

## 2017-09-29 DIAGNOSIS — Z79899 Other long term (current) drug therapy: Secondary | ICD-10-CM | POA: Insufficient documentation

## 2017-09-29 DIAGNOSIS — M5431 Sciatica, right side: Secondary | ICD-10-CM | POA: Insufficient documentation

## 2017-09-29 DIAGNOSIS — Z794 Long term (current) use of insulin: Secondary | ICD-10-CM | POA: Diagnosis not present

## 2017-09-29 DIAGNOSIS — E1140 Type 2 diabetes mellitus with diabetic neuropathy, unspecified: Secondary | ICD-10-CM | POA: Diagnosis not present

## 2017-09-29 DIAGNOSIS — Z87891 Personal history of nicotine dependence: Secondary | ICD-10-CM | POA: Insufficient documentation

## 2017-09-29 DIAGNOSIS — I10 Essential (primary) hypertension: Secondary | ICD-10-CM | POA: Diagnosis not present

## 2017-09-29 DIAGNOSIS — M79604 Pain in right leg: Secondary | ICD-10-CM | POA: Diagnosis present

## 2017-09-29 LAB — URINALYSIS, ROUTINE W REFLEX MICROSCOPIC
Bacteria, UA: NONE SEEN
Bilirubin Urine: NEGATIVE
KETONES UR: 5 mg/dL — AB
Leukocytes, UA: NEGATIVE
Nitrite: NEGATIVE
PH: 5 (ref 5.0–8.0)
Protein, ur: >=300 mg/dL — AB
SPECIFIC GRAVITY, URINE: 1.042 — AB (ref 1.005–1.030)

## 2017-09-29 LAB — CBG MONITORING, ED: GLUCOSE-CAPILLARY: 260 mg/dL — AB (ref 65–99)

## 2017-09-29 MED ORDER — KETOROLAC TROMETHAMINE 60 MG/2ML IM SOLN
60.0000 mg | Freq: Once | INTRAMUSCULAR | Status: AC
  Administered 2017-09-29: 60 mg via INTRAMUSCULAR
  Filled 2017-09-29: qty 2

## 2017-09-29 MED ORDER — ATENOLOL 25 MG PO TABS
100.0000 mg | ORAL_TABLET | Freq: Once | ORAL | Status: DC
  Filled 2017-09-29: qty 4

## 2017-09-29 MED ORDER — LISINOPRIL 5 MG PO TABS
5.0000 mg | ORAL_TABLET | Freq: Once | ORAL | Status: DC
  Filled 2017-09-29: qty 1

## 2017-09-29 MED ORDER — HYDROMORPHONE HCL 1 MG/ML IJ SOLN
1.0000 mg | Freq: Once | INTRAMUSCULAR | Status: AC
  Administered 2017-09-29: 1 mg via INTRAMUSCULAR
  Filled 2017-09-29: qty 1

## 2017-09-29 MED ORDER — NAPROXEN 375 MG PO TABS: 375.0000 mg | ORAL_TABLET | Freq: Two times a day (BID) | ORAL | 0 refills | Status: DC | PRN

## 2017-09-29 MED ORDER — METHOCARBAMOL 500 MG PO TABS: 500.0000 mg | ORAL_TABLET | Freq: Two times a day (BID) | ORAL | 0 refills | Status: DC | PRN

## 2017-09-29 NOTE — ED Triage Notes (Signed)
Right hip/buttock pain that radiates down leg for past couple of months gradually worsening

## 2017-09-29 NOTE — ED Provider Notes (Signed)
[facility] EMERGENCY DEPARTMENT Provider Note   CSN: [id number] Arrival date & time: 09/29/17  4098     History   Chief Complaint Chief Complaint  Patient presents with  . Leg Pain    HPI [name] is a 52 y.o. male.  Patient with history of morbid obesity, diabetes, recent admission for bowel obstruction presenting with 3 weeks of progressively worsening right hip and buttock pain that radiates down his right leg.  Denies any falls or trauma.  He saw his PCP 5 days ago and was given a course of steroids and Norco.  He states he felt well after taking his first dose of prednisone but the pain has become more severe.  It wakes him from sleep and affects his activity during the day.  States it is better if he keeps moving.  He denies fevers, chills, nausea, vomiting.  No pain with urination.  No bowel or bladder incontinence.  No fever or history of cancer or IV drug abuse.  He has not had an MRI of his back.  He is also been taking Norco, tramadol prednisone for his back.  His PCP referred him to Dr. Romeo Apple who he has not seen yet.  The history is provided by the patient and the spouse.  Leg Pain      Past Medical History:  Diagnosis Date  . Diabetes mellitus without complication (HCC)   . Diabetic neuropathy (HCC) 03/25/2012  . Diverticulitis   . DM type 2 (diabetes mellitus, type 2) (HCC) 03/24/2012  . Hyperlipidemia 03/24/2012  . Hypertension   . Incisional hernia without mention of obstruction or gangrene 02/07/2011  . Pancreatitis   . Paroxysmal atrial fibrillation (HCC) 03/25/2012   Transient, occurred off of atenolol.  . Renal disorder     Patient Active Problem List   Diagnosis Date Noted  . Partial small bowel obstruction (HCC) 08/26/2017  . GERD (gastroesophageal reflux disease) 08/26/2017  . Diabetes mellitus type 2, uncontrolled, with complications (HCC) 12/15/2012  . Diabetic neuropathy (HCC) 03/25/2012  . Pancreatitis, acute 03/23/2012  . Hyponatremia  03/23/2012  . Alcohol abuse 03/23/2012  . GOUT 05/30/2009  . Morbid obesity (HCC) 05/30/2009  . Tobacco abuse 05/30/2009  . Essential hypertension 05/30/2009    Past Surgical History:  Procedure Laterality Date  . APPENDECTOMY  04/15/2009  . COLON SURGERY  04/15/2009  . HERNIA REPAIR  01/25/11   lap ventral hernia repair x9  . LAPAROSCOPIC CHOLECYSTECTOMY    . LAPAROSCOPIC LYSIS OF ADHESIONS N/A 09/17/2012   Procedure: LAPAROSCOPIC and Open  LYSIS OF ADHESIONS;  Surgeon: Ardeth Sportsman, MD;  Location: [facility];  Service: General;  Laterality: N/A;  . VENTRAL HERNIA REPAIR N/A 09/17/2012   Procedure: LAPAROSCOPIC VENTRAL HERNIA;  Surgeon: Ardeth Sportsman, MD;  Location: [facility];  Service: General;  Laterality: N/A;        Home Medications    Prior to Admission medications   Medication Sig Start Date End Date Taking? Authorizing Provider  acetaminophen (TYLENOL) 500 MG tablet Take 1,000 mg by mouth daily.    [provider]  allopurinol (ZYLOPRIM) 300 MG tablet Take 300 mg by mouth daily.  01/21/11   [provider]  atenolol (TENORMIN) 100 MG tablet Take 100 mg by mouth daily.    [provider]  docusate sodium (COLACE) 100 MG capsule Take 1 capsule (100 mg total) by mouth daily. 08/28/17   Glade Lloyd, MD  folic acid (FOLVITE) 1 MG tablet Take 1  tablet (1 mg total) by mouth daily. 08/29/17   Glade LloydAlekh, Kshitiz, MD  lisinopril (PRINIVIL,ZESTRIL) 5 MG tablet Take 5 mg by mouth daily.    [provider]  meloxicam (MOBIC) 15 MG tablet Take 15 mg by mouth daily.    [provider]  Multiple Minerals-Vitamins (CALCIUM-MAGNESIUM-ZINC-D3 PO) Take 1 tablet by mouth daily.    [provider]  omeprazole (PRILOSEC) 20 MG capsule Take 20 mg by mouth daily.    [provider]  ondansetron (ZOFRAN) 4 MG tablet Take 1 tablet (4 mg total) by mouth daily as needed for nausea or vomiting. 08/28/17 08/28/18  Glade LloydAlekh, Kshitiz, MD  polyethylene glycol  (MIRALAX) packet Take 17 g by mouth daily as needed for moderate constipation. 08/28/17   Glade LloydAlekh, Kshitiz, MD  pravastatin (PRAVACHOL) 20 MG tablet Take 20 mg by mouth daily.    [provider]  thiamine 100 MG tablet Take 1 tablet (100 mg total) by mouth daily. 08/29/17   Glade LloydAlekh, Kshitiz, MD  TOUJEO MAX SOLOSTAR 300 UNIT/ML SOPN Inject 110 Units into the skin daily. 08/18/17   [provider]  traMADol (ULTRAM) 50 MG tablet Take 1 tablet (50 mg total) by mouth every 6 (six) hours as needed for moderate pain. 08/28/17 08/28/18  Glade LloydAlekh, Kshitiz, MD    Family History Family History  Problem Relation Age of Onset  . Hypertension Mother   . Hypertension Father     Social History Social History   Tobacco Use  . Smoking status: Former Smoker    Packs/day: 1.00    Years: 30.00    Pack years: 30.00    Types: Cigarettes  . Smokeless tobacco: Never Used  Substance Use Topics  . Alcohol use: Yes  . Drug use: No     Allergies   Hydrocodone   Review of Systems Review of Systems  Constitutional: Negative for activity change, appetite change and fever.  HENT: Negative for congestion and postnasal drip.   Eyes: Negative for visual disturbance.  Respiratory: Negative for cough, chest tightness and shortness of breath.   Cardiovascular: Negative for chest pain and leg swelling.  Gastrointestinal: Negative for abdominal pain, nausea and vomiting.  Genitourinary: Negative for dysuria, hematuria and testicular pain.  Musculoskeletal: Positive for arthralgias, back pain and myalgias.  Skin: Negative for rash.  Neurological: Negative for dizziness, weakness and headaches.    all other systems are negative except as noted in the HPI and PMH.    Physical Exam Updated Vital Signs BP 137/90   Pulse (!) 101   Temp (!) 97.5 F (36.4 C)   Resp 20   Ht 5\' 11"  (1.803 m)   Wt 135.2 kg (298 lb)   SpO2 99%   BMI 41.56 kg/m   Physical Exam  Constitutional: He is oriented to person,  place, and time. He appears well-developed and well-nourished. No distress.  HENT:  Head: Normocephalic and atraumatic.  Mouth/Throat: Oropharynx is clear and moist. No oropharyngeal exudate.  Eyes: Pupils are equal, round, and reactive to light. Conjunctivae and EOM are normal.  Neck: Normal range of motion. Neck supple.  No meningismus.  Cardiovascular: Normal rate, regular rhythm, normal heart sounds and intact distal pulses.  No murmur heard. Pulmonary/Chest: Effort normal and breath sounds normal. No respiratory distress. He exhibits tenderness.  Abdominal: Soft. There is no tenderness. There is no rebound and no guarding.  Musculoskeletal: Normal range of motion. He exhibits tenderness. He exhibits no edema.  R SI joint tenderness 5/5 strength in  bilateral lower extremities. Ankle plantar and dorsiflexion intact. Great toe extension intact bilaterally. +2 DP and PT pulses. +1 patellar reflexes bilaterally. Antalgic gait.   Neurological: He is alert and oriented to person, place, and time. No cranial nerve deficit. He exhibits normal muscle tone. Coordination normal.  No ataxia on finger to nose bilaterally. No pronator drift. 5/5 strength throughout. CN 2-12 intact.Equal grip strength. Sensation intact.   Skin: Skin is warm. Capillary refill takes less than 2 seconds.  Psychiatric: He has a normal mood and affect. His behavior is normal.  Nursing note and vitals reviewed.    ED Treatments / Results  Labs (all labs ordered are listed, but only abnormal results are displayed) Labs Reviewed  CBG MONITORING, ED - Abnormal; Notable for the following components:      Result Value   Glucose-Capillary 260 (*)    All other components within normal limits  URINALYSIS, ROUTINE W REFLEX MICROSCOPIC    EKG None  Radiology No results found.  Procedures Procedures (including critical care time)  Medications Ordered in ED Medications  HYDROmorphone (DILAUDID) injection 1 mg (has no  administration in time range)  ketorolac (TORADOL) injection 60 mg (has no administration in time range)     Initial Impression / Assessment and Plan / ED Course  I have reviewed the triage vital signs and the nursing notes.  Pertinent labs & imaging results that were available during my care of the patient were reviewed by me and considered in my medical decision making (see chart for details).     Sciatica with difficulty walking. No focal weakness. No fever or incontinence. No red flags on exam.  Decreased reflexes but otherwise neuro intact and able to ambulate. Presentation consistent with sciatica. Already on steroids and norco.   PVR  0 mL. Low suspicion for cord compression or cauda equina. HR and BP improved after taking home meds this morning and getting pain meds.   followup with Dr. Margo Aye and Romeo Apple for MRI. No indication for emergent MRI today. Able to ambulate. Continue steroids and norco. Will add robaxin. Patient knows to monitor blood sugar closely while on steroids.  Return precautions discussed. BP 127/79 (BP Location: Right Arm)   Pulse 83   Temp (!) 97.5 F (36.4 C)   Resp 18   Ht 5\' 11"  (1.803 m)   Wt 135.2 kg (298 lb)   SpO2 99%   BMI 41.56 kg/m   Final Clinical Impressions(s) / ED Diagnoses   Final diagnoses:  Sciatica of right side    ED Discharge Orders    None       , Jeannett Senior, MD 09/29/17 [phone]

## 2017-09-29 NOTE — Discharge Instructions (Signed)
Continue the steroids as prescribed by Dr. Margo AyeHall and use the pain medicine as needed.  Follow-up with Dr. Margo AyeHall or Dr. Romeo AppleHarrison for MRI of your back.  Monitor your blood sugars closely while you are on the steroids.  Return to the ED if you develop worsening symptoms including weakness, numbness, incontinence or any other concerns.

## 2017-10-05 ENCOUNTER — Encounter (HOSPITAL_COMMUNITY): Payer: Self-pay | Admitting: *Deleted

## 2017-10-05 ENCOUNTER — Emergency Department (HOSPITAL_COMMUNITY)
Admission: EM | Admit: 2017-10-05 | Discharge: 2017-10-05 | Disposition: A | Discharge status: Home / Self Care | Payer: BLUE CROSS/BLUE SHIELD | Attending: Emergency Medicine | Admitting: Emergency Medicine

## 2017-10-05 DIAGNOSIS — E1140 Type 2 diabetes mellitus with diabetic neuropathy, unspecified: Secondary | ICD-10-CM | POA: Diagnosis not present

## 2017-10-05 DIAGNOSIS — E785 Hyperlipidemia, unspecified: Secondary | ICD-10-CM | POA: Insufficient documentation

## 2017-10-05 DIAGNOSIS — M25551 Pain in right hip: Secondary | ICD-10-CM | POA: Diagnosis present

## 2017-10-05 DIAGNOSIS — Z87891 Personal history of nicotine dependence: Secondary | ICD-10-CM | POA: Insufficient documentation

## 2017-10-05 DIAGNOSIS — Z79899 Other long term (current) drug therapy: Secondary | ICD-10-CM | POA: Diagnosis not present

## 2017-10-05 DIAGNOSIS — I10 Essential (primary) hypertension: Secondary | ICD-10-CM | POA: Insufficient documentation

## 2017-10-05 DIAGNOSIS — G5701 Lesion of sciatic nerve, right lower limb: Secondary | ICD-10-CM | POA: Diagnosis not present

## 2017-10-05 DIAGNOSIS — I480 Paroxysmal atrial fibrillation: Secondary | ICD-10-CM | POA: Insufficient documentation

## 2017-10-05 MED ORDER — DIAZEPAM 5 MG PO TABS
5.0000 mg | ORAL_TABLET | Freq: Once | ORAL | Status: AC
  Administered 2017-10-05: 5 mg via ORAL
  Filled 2017-10-05: qty 1

## 2017-10-05 MED ORDER — KETOROLAC TROMETHAMINE 60 MG/2ML IM SOLN
15.0000 mg | Freq: Once | INTRAMUSCULAR | Status: AC
  Administered 2017-10-05: 15 mg via INTRAMUSCULAR
  Filled 2017-10-05: qty 2

## 2017-10-05 MED ORDER — ACETAMINOPHEN 500 MG PO TABS
1000.0000 mg | ORAL_TABLET | Freq: Once | ORAL | Status: AC
  Administered 2017-10-05: 1000 mg via ORAL
  Filled 2017-10-05: qty 2

## 2017-10-05 MED ORDER — MORPHINE SULFATE (PF) 4 MG/ML IV SOLN
4.0000 mg | Freq: Once | INTRAVENOUS | Status: AC
  Administered 2017-10-05: 4 mg via INTRAMUSCULAR
  Filled 2017-10-05: qty 1

## 2017-10-05 NOTE — Discharge Instructions (Signed)
Take tylenol 1000mg (2 extra strength) four times a day. Take your naproxen as prescribed.  No heavy lifting >10 lbs or twisting at the waist.  Call the number provided for ortho

## 2017-10-05 NOTE — ED Notes (Signed)
ED Provider at bedside. 

## 2017-10-05 NOTE — ED Triage Notes (Signed)
Pt complains of right hip pain radiating down right leg. Pt was seen last week at [facility], dx'd with sciatica. Pt states he felt better after getting shots for pain but has not felt better since. Pt finished steroids 5 days ago.

## 2017-10-05 NOTE — ED Provider Notes (Signed)
[name]   CSN: [id number] Arrival date & time: 10/05/17  [id number]     History   Chief Complaint Chief Complaint  Patient presents with  . Hip Pain    HPI [name] is a 52 y.o. male.  52 yo M with a cc of R sided low back pain. Going on for past week or so.  The patient was seen in the ED about a week ago with a similar complaint.  At that time he improved symptomatically he was diagnosed with sciatica and discharged home with a burst dose of steroids muscle relaxant and an NSAID.  Since then the patient felt that the pain had been consistently bad.  Does not feel that it is significantly worsened.  Has pain with motion of the leg.  States that the bottom of his foot feels tingly but he attributes this to his diabetes.  He denies trauma.  Denies loss of bowel or bladder denies loss of perirectal sensation.  Denies fevers or chills.  The history is provided by the patient.  Hip Pain  This is a new problem. The current episode started less than 1 hour ago. The problem occurs constantly. The problem has not changed since onset.Pertinent negatives include no chest pain, no abdominal pain, no headaches and no shortness of breath. Nothing aggravates the symptoms. Nothing relieves the symptoms. He has tried nothing for the symptoms. The treatment provided no relief.    Past Medical History:  Diagnosis Date  . Diabetes mellitus without complication (HCC)   . Diabetic neuropathy (HCC) 03/25/2012  . Diverticulitis   . DM type 2 (diabetes mellitus, type 2) (HCC) 03/24/2012  . Hyperlipidemia 03/24/2012  . Hypertension   . Incisional hernia without mention of obstruction or gangrene 02/07/2011  . Pancreatitis   . Paroxysmal atrial fibrillation (HCC) 03/25/2012   Transient, occurred off of atenolol.  . Renal disorder     Patient Active Problem List   Diagnosis Date Noted  . Partial small bowel obstruction (HCC) 08/26/2017  . GERD  (gastroesophageal reflux disease) 08/26/2017  . Diabetes mellitus type 2, uncontrolled, with complications (HCC) 12/15/2012  . Diabetic neuropathy (HCC) 03/25/2012  . Pancreatitis, acute 03/23/2012  . Hyponatremia 03/23/2012  . Alcohol abuse 03/23/2012  . GOUT 05/30/2009  . Morbid obesity (HCC) 05/30/2009  . Tobacco abuse 05/30/2009  . Essential hypertension 05/30/2009    Past Surgical History:  Procedure Laterality Date  . APPENDECTOMY  04/15/2009  . COLON SURGERY  04/15/2009  . HERNIA REPAIR  01/25/11   lap ventral hernia repair x9  . LAPAROSCOPIC CHOLECYSTECTOMY    . LAPAROSCOPIC LYSIS OF ADHESIONS N/A 09/17/2012   Procedure: LAPAROSCOPIC and Open  LYSIS OF ADHESIONS;  Surgeon: Ardeth SportsmanSteven C. Gross, MD;  Location: [facility];  Service: General;  Laterality: N/A;  . VENTRAL HERNIA REPAIR N/A 09/17/2012   Procedure: LAPAROSCOPIC VENTRAL HERNIA;  Surgeon: Ardeth SportsmanSteven C. Gross, MD;  Location: [facility];  Service: General;  Laterality: N/A;        Home Medications    Prior to Admission medications   Medication Sig Start Date End Date Taking? Authorizing Provider  acetaminophen (TYLENOL) 500 MG tablet Take 1,000 mg by mouth daily.    [provider]  allopurinol (ZYLOPRIM) 300 MG tablet Take 300 mg by mouth daily.  01/21/11   [provider]  atenolol (TENORMIN) 100 MG tablet Take 100 mg by mouth daily.    [provider]  docusate sodium (COLACE) 100  MG capsule Take 1 capsule (100 mg total) by mouth daily. 08/28/17   Glade Lloyd, MD  folic acid (FOLVITE) 1 MG tablet Take 1 tablet (1 mg total) by mouth daily. 08/29/17   Glade Lloyd, MD  lisinopril (PRINIVIL,ZESTRIL) 5 MG tablet Take 5 mg by mouth daily.    [provider]  meloxicam (MOBIC) 15 MG tablet Take 15 mg by mouth daily.    [provider]  methocarbamol (ROBAXIN) 500 MG tablet Take 1 tablet (500 mg total) by mouth 2 (two) times daily as needed for muscle spasms. 09/29/17   Rancour, Jeannett Senior, MD    Multiple Minerals-Vitamins (CALCIUM-MAGNESIUM-ZINC-D3 PO) Take 1 tablet by mouth daily.    [provider]  naproxen (NAPROSYN) 375 MG tablet Take 1 tablet (375 mg total) by mouth 2 (two) times daily as needed. 09/29/17   Rancour, Jeannett Senior, MD  omeprazole (PRILOSEC) 20 MG capsule Take 20 mg by mouth daily.    [provider]  ondansetron (ZOFRAN) 4 MG tablet Take 1 tablet (4 mg total) by mouth daily as needed for nausea or vomiting. 08/28/17 08/28/18  Glade Lloyd, MD  polyethylene glycol (MIRALAX) packet Take 17 g by mouth daily as needed for moderate constipation. 08/28/17   Glade Lloyd, MD  pravastatin (PRAVACHOL) 20 MG tablet Take 20 mg by mouth daily.    [provider]  thiamine 100 MG tablet Take 1 tablet (100 mg total) by mouth daily. 08/29/17   Glade Lloyd, MD  TOUJEO MAX SOLOSTAR 300 UNIT/ML SOPN Inject 110 Units into the skin daily. 08/18/17   [provider]  traMADol (ULTRAM) 50 MG tablet Take 1 tablet (50 mg total) by mouth every 6 (six) hours as needed for moderate pain. 08/28/17 08/28/18  Glade Lloyd, MD    Family History Family History  Problem Relation Age of Onset  . Hypertension Mother   . Hypertension Father     Social History Social History   Tobacco Use  . Smoking status: Former Smoker    Packs/day: 1.00    Years: 30.00    Pack years: 30.00    Types: Cigarettes  . Smokeless tobacco: Never Used  Substance Use Topics  . Alcohol use: Yes  . Drug use: No     Allergies   Hydrocodone   Review of Systems Review of Systems  Constitutional: Negative for chills and fever.  HENT: Negative for congestion and facial swelling.   Eyes: Negative for discharge and visual disturbance.  Respiratory: Negative for shortness of breath.   Cardiovascular: Negative for chest pain and palpitations.  Gastrointestinal: Negative for abdominal pain, diarrhea and vomiting.  Musculoskeletal: Positive for arthralgias and back pain. Negative for  myalgias.  Skin: Negative for color change and rash.  Neurological: Negative for tremors, syncope and headaches.  Psychiatric/Behavioral: Negative for confusion and dysphoric mood.     Physical Exam Updated Vital Signs BP (!) 191/123 (BP Location: Left Arm)   Pulse 73   Temp 97.6 F (36.4 C) (Oral)   Resp 18   SpO2 98%   Physical Exam  Constitutional: He is oriented to person, place, and time. He appears well-developed and well-nourished.  HENT:  Head: Normocephalic and atraumatic.  Eyes: Pupils are equal, round, and reactive to light. EOM are normal.  Neck: Normal range of motion. Neck supple. No JVD present.  Cardiovascular: Normal rate and regular rhythm. Exam reveals no gallop and no friction rub.  No murmur heard. Pulmonary/Chest: No respiratory distress. He has no wheezes.  Abdominal:  He exhibits no distension and no mass. There is no tenderness. There is no rebound and no guarding.  Musculoskeletal: Normal range of motion.  Focally tender to the piriformis muscle belly.  Intact pulse motor and sensation distally.  Reflexes 1+ and equal to the other side.  No clonus.  No weakness.  Straight leg raise test negative bilaterally.  No midline spinal tenderness.  Neurological: He is alert and oriented to person, place, and time.  Skin: No rash noted. No pallor.  Psychiatric: He has a normal mood and affect. His behavior is normal.  Nursing note and vitals reviewed.    ED Treatments / Results  Labs (all labs ordered are listed, but only abnormal results are displayed) Labs Reviewed - No data to display  EKG None  Radiology No results found.  Procedures Procedures (including critical care time)  Medications Ordered in ED Medications  ketorolac (TORADOL) injection 15 mg (15 mg Intramuscular Given 10/05/17 0919)  morphine 4 MG/ML injection 4 mg (4 mg Intramuscular Given 10/05/17 0919)  acetaminophen (TYLENOL) tablet 1,000 mg (1,000 mg Oral Given 10/05/17 0917)    diazepam (VALIUM) tablet 5 mg (5 mg Oral Given 10/05/17 4098)     Initial Impression / Assessment and Plan / ED Course  I have reviewed the triage vital signs and the nursing notes.  Pertinent labs & imaging results that were available during my care of the patient were reviewed by me and considered in my medical decision making (see chart for details).     52 yo M with a chief complaint of right sided low back pain.  This localizes to the piriformis on my exam.  I will treat this is piriformis syndrome.  I discussed with the patient at length about home care, and what he should take for pain.  His family physician had referred him to [facility] and he has not yet heard back from them.  I discussed with him of the name change and was given the number in the contact information so that he could contact them himself on Monday.  I do not feel that imaging is warranted without any signs of cauda equina syndrome.  Will treat symptomatically here.  At the patient follow-up with his PCP and Ortho.  9:36 AM:  I have discussed the diagnosis/risks/treatment options with the patient and family and believe the pt to be eligible for discharge home to follow-up with PCP, ORtho. We also discussed returning to the ED immediately if new or worsening sx occur. We discussed the sx which are most concerning (e.g., sudden worsening pain, fever, weakness or numbness to the leg loss of bowel or bladder.) that necessitate immediate return. Medications administered to the patient during their visit and any new prescriptions provided to the patient are listed below.  Medications given during this visit Medications  ketorolac (TORADOL) injection 15 mg (15 mg Intramuscular Given 10/05/17 0919)  morphine 4 MG/ML injection 4 mg (4 mg Intramuscular Given 10/05/17 0919)  acetaminophen (TYLENOL) tablet 1,000 mg (1,000 mg Oral Given 10/05/17 0917)  diazepam (VALIUM) tablet 5 mg (5 mg Oral Given 10/05/17 1191)       The patient appears reasonably screen and/or stabilized for discharge and I doubt any other medical condition or other [facility] requiring further screening, evaluation, or treatment in the ED at this time prior to discharge.    Final Clinical Impressions(s) / ED Diagnoses   Final diagnoses:  Piriformis syndrome, right    ED Discharge Orders  None       Melene Plan, [place] 10/05/17 [phone]

## 2017-10-17 ENCOUNTER — Emergency Department (HOSPITAL_COMMUNITY): Payer: BLUE CROSS/BLUE SHIELD

## 2017-10-17 ENCOUNTER — Emergency Department (HOSPITAL_COMMUNITY)
Admission: EM | Admit: 2017-10-17 | Discharge: 2017-10-17 | Disposition: A | Discharge status: Home / Self Care | Payer: BLUE CROSS/BLUE SHIELD | Attending: Emergency Medicine | Admitting: Emergency Medicine

## 2017-10-17 ENCOUNTER — Encounter (HOSPITAL_COMMUNITY): Payer: Self-pay | Admitting: Emergency Medicine

## 2017-10-17 ENCOUNTER — Other Ambulatory Visit: Payer: Self-pay

## 2017-10-17 DIAGNOSIS — Z87891 Personal history of nicotine dependence: Secondary | ICD-10-CM | POA: Insufficient documentation

## 2017-10-17 DIAGNOSIS — S39012D Strain of muscle, fascia and tendon of lower back, subsequent encounter: Secondary | ICD-10-CM | POA: Insufficient documentation

## 2017-10-17 DIAGNOSIS — X58XXXD Exposure to other specified factors, subsequent encounter: Secondary | ICD-10-CM | POA: Insufficient documentation

## 2017-10-17 DIAGNOSIS — I10 Essential (primary) hypertension: Secondary | ICD-10-CM | POA: Insufficient documentation

## 2017-10-17 DIAGNOSIS — M5431 Sciatica, right side: Secondary | ICD-10-CM | POA: Diagnosis not present

## 2017-10-17 DIAGNOSIS — M545 Low back pain: Secondary | ICD-10-CM | POA: Diagnosis present

## 2017-10-17 DIAGNOSIS — E1140 Type 2 diabetes mellitus with diabetic neuropathy, unspecified: Secondary | ICD-10-CM | POA: Insufficient documentation

## 2017-10-17 DIAGNOSIS — Z794 Long term (current) use of insulin: Secondary | ICD-10-CM | POA: Diagnosis not present

## 2017-10-17 MED ORDER — ONDANSETRON 4 MG PO TBDP
4.0000 mg | ORAL_TABLET | Freq: Once | ORAL | Status: AC
  Administered 2017-10-17: 4 mg via ORAL
  Filled 2017-10-17: qty 1

## 2017-10-17 MED ORDER — OXYCODONE-ACETAMINOPHEN 5-325 MG PO TABS: 1.0000 | ORAL_TABLET | ORAL | 0 refills | Status: DC | PRN

## 2017-10-17 MED ORDER — OXYCODONE-ACETAMINOPHEN 5-325 MG PO TABS
1.0000 | ORAL_TABLET | Freq: Once | ORAL | Status: AC
  Administered 2017-10-17: 1 via ORAL
  Filled 2017-10-17: qty 1

## 2017-10-17 MED ORDER — HYDROMORPHONE HCL 1 MG/ML IJ SOLN
1.0000 mg | Freq: Once | INTRAMUSCULAR | Status: AC
  Administered 2017-10-17: 1 mg via INTRAMUSCULAR
  Filled 2017-10-17: qty 1

## 2017-10-17 NOTE — Discharge Instructions (Addendum)
You may take the oxycodone prescribed for pain relief in place of your hydrocodone.  This will make you drowsy - do not drive within 4 hours of taking this medication. Avoid letting your pain escalate before taking a dose of your pain medicine as discussed as this will make it harder to keep under control.   Avoid lifting,  Bending,  Twisting or any other activity that worsens your pain over the next week.  You should get rechecked urgently if you develop weakness in your leg(s) or loss of bladder or bowel function - these are symptoms of a worsening condition.

## 2017-10-17 NOTE — ED Provider Notes (Signed)
[facility] EMERGENCY DEPARTMENT Provider Note   CSN: [id number] Arrival date & time: 10/17/17  [id number]     History   Chief Complaint Chief Complaint  Patient presents with  . Back Pain    HPI [name] is a 52 y.o. male with a past medical history as outlined below, most significant for diabetes with mild peripheral diabetic neuropathy, hypertension presenting with an almost 6-week history of right lower back pain with radiculopathy to his foot.  He has undergone imaging by his orthopedist in [facility] and is scheduled for steroid injections in 1 week for low lumbar and sacral inflammatory source of pain, although patient states his plain films completed there were negative for any acute findings.  He denies weakness in his extremities, however at times he has pain severe enough with certain movements and with walking that he feels unsteady on his feet.  He denies urinary retention or incontinence and has no saddle anesthesia.  He also denies abdominal pain or distention, nausea, vomiting, fevers or chills.  He is currently taking hydrocodone which does not offer significant pain relief.  He states he has had little sleep the past several nights secondary to pain.  His pain is improved when he flexes his knees up, but is worsened when he tries to lie down and with positional changes.  He was advised to avoid any NSAIDs or aspirin between now and his injection which is scheduled for July 5.     The history is provided by the patient.    Past Medical History:  Diagnosis Date  . Diabetes mellitus without complication (HCC)   . Diabetic neuropathy (HCC) 03/25/2012  . Diverticulitis   . DM type 2 (diabetes mellitus, type 2) (HCC) 03/24/2012  . Hyperlipidemia 03/24/2012  . Hypertension   . Incisional hernia without mention of obstruction or gangrene 02/07/2011  . Pancreatitis   . Paroxysmal atrial fibrillation (HCC) 03/25/2012   Transient, occurred off of atenolol.  . Renal  disorder     Patient Active Problem List   Diagnosis Date Noted  . Partial small bowel obstruction (HCC) 08/26/2017  . GERD (gastroesophageal reflux disease) 08/26/2017  . Diabetes mellitus type 2, uncontrolled, with complications (HCC) 12/15/2012  . Diabetic neuropathy (HCC) 03/25/2012  . Pancreatitis, acute 03/23/2012  . Hyponatremia 03/23/2012  . Alcohol abuse 03/23/2012  . GOUT 05/30/2009  . Morbid obesity (HCC) 05/30/2009  . Tobacco abuse 05/30/2009  . Essential hypertension 05/30/2009    Past Surgical History:  Procedure Laterality Date  . APPENDECTOMY  04/15/2009  . COLON SURGERY  04/15/2009  . HERNIA REPAIR  01/25/11   lap ventral hernia repair x9  . LAPAROSCOPIC CHOLECYSTECTOMY    . LAPAROSCOPIC LYSIS OF ADHESIONS N/A 09/17/2012   Procedure: LAPAROSCOPIC and Open  LYSIS OF ADHESIONS;  Surgeon: Ardeth Sportsman, MD;  Location: [facility];  Service: General;  Laterality: N/A;  . VENTRAL HERNIA REPAIR N/A 09/17/2012   Procedure: LAPAROSCOPIC VENTRAL HERNIA;  Surgeon: Ardeth Sportsman, MD;  Location: [facility];  Service: General;  Laterality: N/A;        Home Medications    Prior to Admission medications   Medication Sig Start Date End Date Taking? Authorizing Provider  acetaminophen (TYLENOL) 500 MG tablet Take 1,000 mg by mouth daily.   Yes [provider]  allopurinol (ZYLOPRIM) 300 MG tablet Take 300 mg by mouth daily.  01/21/11  Yes [provider]  amLODipine (NORVASC) 5 MG tablet Take 5 mg by mouth at  bedtime. 10/06/17  Yes [provider]  atenolol (TENORMIN) 100 MG tablet Take 100 mg by mouth daily.   Yes [provider]  docusate sodium (COLACE) 100 MG capsule Take 1 capsule (100 mg total) by mouth daily. 08/28/17  Yes Glade LloydAlekh, Kshitiz, MD  gabapentin (NEURONTIN) 300 MG capsule Take 1 capsule by mouth 2 (two) times daily. 10/06/17  Yes [provider]  lisinopril (PRINIVIL,ZESTRIL) 20 MG tablet Take 20 mg by mouth daily. 09/25/17  Yes  [provider]  methocarbamol (ROBAXIN) 500 MG tablet Take 1 tablet (500 mg total) by mouth 2 (two) times daily as needed for muscle spasms. 09/29/17  Yes Rancour, Jeannett SeniorStephen, MD  Multiple Minerals-Vitamins (CALCIUM-MAGNESIUM-ZINC-D3 PO) Take 1 tablet by mouth daily.   Yes [provider]  omeprazole (PRILOSEC) 20 MG capsule Take 20 mg by mouth daily.   Yes [provider]  polyethylene glycol (MIRALAX) packet Take 17 g by mouth daily as needed for moderate constipation. 08/28/17  Yes Glade LloydAlekh, Kshitiz, MD  pravastatin (PRAVACHOL) 20 MG tablet Take 20 mg by mouth daily.   Yes [provider]  TOUJEO MAX SOLOSTAR 300 UNIT/ML SOPN Inject 130 Units into the skin daily.  08/18/17  Yes [provider]  folic acid (FOLVITE) 1 MG tablet Take 1 tablet (1 mg total) by mouth daily. Patient not taking: Reported on 10/17/2017 08/29/17   Glade LloydAlekh, Kshitiz, MD  naproxen (NAPROSYN) 375 MG tablet Take 1 tablet (375 mg total) by mouth 2 (two) times daily as needed. Patient not taking: Reported on 10/17/2017 09/29/17   Glynn Octaveancour, Stephen, MD  ondansetron (ZOFRAN) 4 MG tablet Take 1 tablet (4 mg total) by mouth daily as needed for nausea or vomiting. Patient not taking: Reported on 10/17/2017 08/28/17 08/28/18  Glade LloydAlekh, Kshitiz, MD  oxyCODONE-acetaminophen (PERCOCET/ROXICET) 5-325 MG tablet Take 1 tablet by mouth every 4 (four) hours as needed. 10/17/17   Burgess AmorIdol, , PA-C  thiamine 100 MG tablet Take 1 tablet (100 mg total) by mouth daily. Patient not taking: Reported on 10/17/2017 08/29/17   Glade LloydAlekh, Kshitiz, MD  traMADol (ULTRAM) 50 MG tablet Take 1 tablet (50 mg total) by mouth every 6 (six) hours as needed for moderate pain. Patient not taking: Reported on 10/17/2017 08/28/17 08/28/18  Glade LloydAlekh, Kshitiz, MD    Family History Family History  Problem Relation Age of Onset  . Hypertension Mother   . Hypertension Father     Social History Social History   Tobacco Use  . Smoking status: Former Smoker      Packs/day: 1.00    Years: 30.00    Pack years: 30.00    Types: Cigarettes  . Smokeless tobacco: Never Used  Substance Use Topics  . Alcohol use: Yes  . Drug use: No     Allergies   Hydrocodone   Review of Systems Review of Systems  Constitutional: Negative for fever.  HENT: Negative for congestion and sore throat.   Eyes: Negative.   Respiratory: Negative for chest tightness and shortness of breath.   Cardiovascular: Negative for chest pain and leg swelling.  Gastrointestinal: Negative for abdominal distention, abdominal pain, constipation and nausea.  Genitourinary: Negative.   Musculoskeletal: Positive for back pain. Negative for arthralgias, gait problem, joint swelling and neck pain.  Skin: Negative.  Negative for rash and wound.  Neurological: Negative for dizziness, weakness, light-headedness, numbness and headaches.  Psychiatric/Behavioral: Negative.      Physical Exam Updated Vital Signs BP (!) 160/105   Pulse 79   Temp 97.8  F (36.6 C) (Oral)   Resp 18   Ht 5\' 11"  (1.803 m)   Wt 135.2 kg (298 lb)   SpO2 99% Comment: Simultaneous filing. User may not have seen previous data.  BMI 41.56 kg/m   Physical Exam  Constitutional: He appears well-developed and well-nourished.  HENT:  Head: Normocephalic.  Eyes: Conjunctivae are normal.  Neck: Normal range of motion. Neck supple.  Cardiovascular: Normal rate and intact distal pulses.  Pedal pulses normal.  Pulmonary/Chest: Effort normal.  Abdominal: Soft. Bowel sounds are normal. He exhibits no distension and no mass.  Musculoskeletal: Normal range of motion. He exhibits no edema.       Lumbar back: He exhibits tenderness. He exhibits no bony tenderness, no swelling, no edema and no spasm.  No midline lumbar pain.  He is tender to palpation over his right upper buttock with reproducible radiculopathy with palpation over his sciatic nerve.  Neurological: He is alert. He has normal strength. He displays no  atrophy and no tremor. No sensory deficit. Gait normal.  Reflex Scores:      Patellar reflexes are 2+ on the right side and 2+ on the left side.      Achilles reflexes are 2+ on the right side and 2+ on the left side. No strength deficit noted in hip and knee flexor and extensor muscle groups.  Ankle flexion and extension intact.  Skin: Skin is warm and dry.  Psychiatric: He has a normal mood and affect.  Nursing note and vitals reviewed.    ED Treatments / Results  Labs (all labs ordered are listed, but only abnormal results are displayed) Labs Reviewed - No data to display  EKG None  Radiology No results found.  Procedures Procedures (including critical care time)  Medications Ordered in ED Medications  ondansetron (ZOFRAN-ODT) disintegrating tablet 4 mg (4 mg Oral Given 10/17/17 0956)  HYDROmorphone (DILAUDID) injection 1 mg (1 mg Intramuscular Given 10/17/17 0956)  oxyCODONE-acetaminophen (PERCOCET/ROXICET) 5-325 MG per tablet 1 tablet (1 tablet Oral Given 10/17/17 1131)     Initial Impression / Assessment and Plan / ED Course  I have reviewed the triage vital signs and the nursing notes.  Pertinent labs & imaging results that were available during my care of the patient were reviewed by me and considered in my medical decision making (see chart for details).     Patient may need MRI imaging given radicular pain, this was offered today, especially given this is his third visit to the ED for this complaint.  However he endorses claustrophobia and prefers to avoid this testing at this time.  He states the plan going forward is for steroid injections x3, after which a probable MRI will be performed if he does not get improvement with these injections.  There are no clear neurologic deficits on today's exam, no emergent indication for MRI.  He was prescribed oxycodone in place of the hydrocodone for better pain control until his injection next week.  Heat therapy, activity as  tolerated discussed.  Return precautions outlined.  Follow-up with his orthopedist as planned.  Final Clinical Impressions(s) / ED Diagnoses   Final diagnoses:  Strain of lumbar region, subsequent encounter  Sciatica of right side    ED Discharge Orders        Ordered    oxyCODONE-acetaminophen (PERCOCET/ROXICET) 5-325 MG tablet  Every 4 hours PRN     10/17/17 1057       Burgess Amor, PA-C 10/17/17 1145    Long,  Arlyss Repress, MD 10/17/17 1312

## 2017-10-17 NOTE — ED Triage Notes (Addendum)
Patient c/o low back pain that radiates into right leg. Per patient seen here in ER 3 weeks ago and diagnosed sciatica, given muscle relaxer and naproxen prescription. Per patient no relief. Patient taking hydrocodone that was prescribed by Dr Demetrius CharityHall-still no relief. Wife states patient has appointment at pain management clinic to have injection and patient was told not to take anything with aspirin, ibuprofen, or antibiotics.

## 2017-11-02 ENCOUNTER — Encounter (HOSPITAL_COMMUNITY): Payer: Self-pay | Admitting: Emergency Medicine

## 2017-11-02 ENCOUNTER — Other Ambulatory Visit: Payer: Self-pay

## 2017-11-02 ENCOUNTER — Emergency Department (HOSPITAL_COMMUNITY)
Admission: EM | Admit: 2017-11-02 | Discharge: 2017-11-02 | Disposition: A | Discharge status: Home / Self Care | Payer: BLUE CROSS/BLUE SHIELD | Attending: Emergency Medicine | Admitting: Emergency Medicine

## 2017-11-02 DIAGNOSIS — M5416 Radiculopathy, lumbar region: Secondary | ICD-10-CM | POA: Insufficient documentation

## 2017-11-02 DIAGNOSIS — E1140 Type 2 diabetes mellitus with diabetic neuropathy, unspecified: Secondary | ICD-10-CM | POA: Insufficient documentation

## 2017-11-02 DIAGNOSIS — Z87891 Personal history of nicotine dependence: Secondary | ICD-10-CM | POA: Insufficient documentation

## 2017-11-02 DIAGNOSIS — I10 Essential (primary) hypertension: Secondary | ICD-10-CM | POA: Insufficient documentation

## 2017-11-02 DIAGNOSIS — Z79899 Other long term (current) drug therapy: Secondary | ICD-10-CM | POA: Insufficient documentation

## 2017-11-02 DIAGNOSIS — M545 Low back pain: Secondary | ICD-10-CM | POA: Diagnosis present

## 2017-11-02 MED ORDER — HYDROMORPHONE HCL 1 MG/ML IJ SOLN
1.0000 mg | Freq: Once | INTRAMUSCULAR | Status: AC
  Administered 2017-11-02: 1 mg via INTRAMUSCULAR
  Filled 2017-11-02: qty 1

## 2017-11-02 MED ORDER — ONDANSETRON 4 MG PO TBDP
4.0000 mg | ORAL_TABLET | Freq: Once | ORAL | Status: AC
  Administered 2017-11-02: 4 mg via ORAL
  Filled 2017-11-02: qty 1

## 2017-11-02 MED ORDER — METHOCARBAMOL 750 MG PO TABS: 750.0000 mg | ORAL_TABLET | Freq: Four times a day (QID) | ORAL | 0 refills | Status: DC

## 2017-11-02 MED ORDER — HYDROMORPHONE HCL 1 MG/ML IJ SOLN
0.5000 mg | Freq: Once | INTRAMUSCULAR | Status: AC
  Administered 2017-11-02: 0.5 mg via INTRAMUSCULAR
  Filled 2017-11-02: qty 1

## 2017-11-02 MED ORDER — PREDNISONE 20 MG PO TABS: 40.0000 mg | ORAL_TABLET | Freq: Every day | ORAL | 0 refills | Status: AC

## 2017-11-02 MED ORDER — KETOROLAC TROMETHAMINE 30 MG/ML IJ SOLN
30.0000 mg | Freq: Once | INTRAMUSCULAR | Status: AC
  Administered 2017-11-02: 30 mg via INTRAMUSCULAR
  Filled 2017-11-02: qty 1

## 2017-11-02 MED ORDER — DEXAMETHASONE SODIUM PHOSPHATE 10 MG/ML IJ SOLN
10.0000 mg | Freq: Once | INTRAMUSCULAR | Status: AC
  Administered 2017-11-02: 10 mg via INTRAMUSCULAR
  Filled 2017-11-02: qty 1

## 2017-11-02 NOTE — ED Provider Notes (Signed)
[facility] EMERGENCY DEPARTMENT Provider Note   CSN: [id number] Arrival date & time: 11/02/17  0840     History   Chief Complaint Chief Complaint  Patient presents with  . Back Pain    HPI [name] is a 52 y.o. male presenting with complaints of worse pain in his right lower back with radiation into his right leg to his lower calf since he underwent a steroid injection (describes what sounds like possibly under fluoroscopic guidance) under Dr. Ethelene Hal' care.  He reports pain is worsened with attempts at movement, and severe when he wakes in the middle of the night with attempts at movement/positional changes.  He denies fevers, chills, vomiting, but does endorse nausea when the pain is severe.  He has been in contact with Dr. Ethelene Hal, underwent MRI 2 days ago and is scheduled to f/u with Dr. Ethelene Hal this week.  He denies urinary or fecal incontinence or retention and denies saddle anesthesia, but does endorse tingling in his right foot.  He has fallen twice in the past 2 weeks when he has had sharp stabs of pain with trying to get out of a chair.  Pt states Dr. Ethelene Hal called in a pain medicine prescription for him which he will pick up today. He has also tried anti inflammatories and Salon Pas lidocaine patches without improvement.  The history is provided by the patient.    Past Medical History:  Diagnosis Date  . Diabetes mellitus without complication (HCC)   . Diabetic neuropathy (HCC) 03/25/2012  . Diverticulitis   . DM type 2 (diabetes mellitus, type 2) (HCC) 03/24/2012  . Hyperlipidemia 03/24/2012  . Hypertension   . Incisional hernia without mention of obstruction or gangrene 02/07/2011  . Pancreatitis   . Paroxysmal atrial fibrillation (HCC) 03/25/2012   Transient, occurred off of atenolol.  . Renal disorder     Patient Active Problem List   Diagnosis Date Noted  . Partial small bowel obstruction (HCC) 08/26/2017  . GERD (gastroesophageal reflux disease) 08/26/2017  .  Diabetes mellitus type 2, uncontrolled, with complications (HCC) 12/15/2012  . Diabetic neuropathy (HCC) 03/25/2012  . Pancreatitis, acute 03/23/2012  . Hyponatremia 03/23/2012  . Alcohol abuse 03/23/2012  . GOUT 05/30/2009  . Morbid obesity (HCC) 05/30/2009  . Tobacco abuse 05/30/2009  . Essential hypertension 05/30/2009    Past Surgical History:  Procedure Laterality Date  . APPENDECTOMY  04/15/2009  . COLON SURGERY  04/15/2009  . HERNIA REPAIR  01/25/11   lap ventral hernia repair x9  . LAPAROSCOPIC CHOLECYSTECTOMY    . LAPAROSCOPIC LYSIS OF ADHESIONS N/A 09/17/2012   Procedure: LAPAROSCOPIC and Open  LYSIS OF ADHESIONS;  Surgeon: Ardeth Sportsman, MD;  Location: [facility];  Service: General;  Laterality: N/A;  . VENTRAL HERNIA REPAIR N/A 09/17/2012   Procedure: LAPAROSCOPIC VENTRAL HERNIA;  Surgeon: Ardeth Sportsman, MD;  Location: [facility];  Service: General;  Laterality: N/A;        Home Medications    Prior to Admission medications   Medication Sig Start Date End Date Taking? Authorizing Provider  acetaminophen (TYLENOL) 500 MG tablet Take 1,000 mg by mouth 2 (two) times daily.    Yes [provider]  allopurinol (ZYLOPRIM) 300 MG tablet Take 300 mg by mouth daily.  01/21/11  Yes [provider]  amLODipine (NORVASC) 5 MG tablet Take 5 mg by mouth at bedtime. 10/06/17  Yes [provider]  atenolol (TENORMIN) 100 MG tablet Take 100 mg by mouth daily.  Yes [provider]  Cholecalciferol (VITAMIN D3) 5000 units CAPS Take 2 capsules by mouth daily.   Yes [provider]  colchicine 0.6 MG tablet Take 0.6 mg by mouth at bedtime.   Yes [provider]  gabapentin (NEURONTIN) 300 MG capsule Take 1 capsule by mouth at bedtime.  10/06/17  Yes [provider]  lisinopril (PRINIVIL,ZESTRIL) 20 MG tablet Take 20 mg by mouth daily. 09/25/17  Yes [provider]  meloxicam (MOBIC) 15 MG tablet Take 15 mg by mouth daily.    Yes [provider]  Multiple Minerals-Vitamins (CALCIUM-MAGNESIUM-ZINC-D3 PO) Take 1 tablet by mouth daily.   Yes [provider]  omeprazole (PRILOSEC) 20 MG capsule Take 20 mg by mouth daily.   Yes [provider]  oxyCODONE-acetaminophen (PERCOCET/ROXICET) 5-325 MG tablet Take 1 tablet by mouth every 4 (four) hours as needed. 10/17/17  Yes , Raynelle Fanning, PA-C  polyethylene glycol (MIRALAX) packet Take 17 g by mouth daily as needed for moderate constipation. 08/28/17  Yes Glade Lloyd, MD  pravastatin (PRAVACHOL) 20 MG tablet Take 20 mg by mouth daily.   Yes [provider]  TOUJEO MAX SOLOSTAR 300 UNIT/ML SOPN Inject 130 Units into the skin daily.  08/18/17  Yes [provider]  docusate sodium (COLACE) 100 MG capsule Take 1 capsule (100 mg total) by mouth daily. 08/28/17   Glade Lloyd, MD  folic acid (FOLVITE) 1 MG tablet Take 1 tablet (1 mg total) by mouth daily. Patient not taking: Reported on 10/17/2017 08/29/17   Glade Lloyd, MD  methocarbamol (ROBAXIN-750) 750 MG tablet Take 1 tablet (750 mg total) by mouth 4 (four) times daily. 11/02/17   Burgess Amor, PA-C  naproxen (NAPROSYN) 375 MG tablet Take 1 tablet (375 mg total) by mouth 2 (two) times daily as needed. Patient not taking: Reported on 10/17/2017 09/29/17   Glynn Octave, MD  ondansetron (ZOFRAN) 4 MG tablet Take 1 tablet (4 mg total) by mouth daily as needed for nausea or vomiting. Patient not taking: Reported on 10/17/2017 08/28/17 08/28/18  Glade Lloyd, MD  predniSONE (DELTASONE) 20 MG tablet Take 2 tablets (40 mg total) by mouth daily with breakfast for 4 days. 11/03/17 11/07/17  Burgess Amor, PA-C  thiamine 100 MG tablet Take 1 tablet (100 mg total) by mouth daily. Patient not taking: Reported on 10/17/2017 08/29/17   Glade Lloyd, MD  traMADol (ULTRAM) 50 MG tablet Take 1 tablet (50 mg total) by mouth every 6 (six) hours as needed for moderate pain. Patient not taking: Reported on 10/17/2017  08/28/17 08/28/18  Glade Lloyd, MD    Family History Family History  Problem Relation Age of Onset  . Hypertension Mother   . Hypertension Father     Social History Social History   Tobacco Use  . Smoking status: Former Smoker    Packs/day: 1.00    Years: 30.00    Pack years: 30.00    Types: Cigarettes  . Smokeless tobacco: Never Used  Substance Use Topics  . Alcohol use: Yes  . Drug use: No     Allergies   Hydrocodone   Review of Systems Review of Systems  Constitutional: Negative for fever.  Respiratory: Negative for shortness of breath.   Cardiovascular: Negative for chest pain and leg swelling.  Gastrointestinal: Negative for abdominal distention, abdominal pain and constipation.  Genitourinary: Negative for difficulty urinating, dysuria, flank pain, frequency and urgency.  Musculoskeletal: Positive for back pain. Negative for gait problem and joint swelling.  Skin:  Negative for rash.  Neurological: Positive for numbness. Negative for weakness.     Physical Exam Updated Vital Signs BP (!) 159/105   Pulse 72   Temp 98 F (36.7 C) (Oral)   Resp 20   Ht 5\' 11"  (1.803 m)   Wt 135.2 kg (298 lb)   SpO2 98%   BMI 41.56 kg/m   Physical Exam  Constitutional: He appears well-developed and well-nourished.  HENT:  Head: Normocephalic.  Eyes: Conjunctivae are normal.  Neck: Normal range of motion. Neck supple.  Cardiovascular: Normal rate and intact distal pulses.  Pedal pulses normal.  Pulmonary/Chest: Effort normal.  Abdominal: Soft. Bowel sounds are normal. He exhibits no distension and no mass.  Musculoskeletal: Normal range of motion. He exhibits no edema.       Lumbar back: He exhibits tenderness. He exhibits no bony tenderness, no swelling, no edema and no spasm.  ttp right SI joint which triggers sciatica distribution pain.  No midline lumbar pain, edema or erythema.    Neurological: He is alert. He has normal strength. He displays no atrophy and no  tremor. No sensory deficit. Gait normal. He displays no Babinski's sign on the right side. He displays no Babinski's sign on the left side.  Reflex Scores:      Patellar reflexes are 1+ on the right side and 1+ on the left side. No strength deficit noted in hip and knee flexor and extensor muscle groups.  Ankle flexion and extension intact. SLR positive right.   Skin: Skin is warm and dry.  Psychiatric: He has a normal mood and affect.  Nursing note and vitals reviewed.    ED Treatments / Results  Labs (all labs ordered are listed, but only abnormal results are displayed) Labs Reviewed - No data to display  EKG None  Radiology No results found.  Procedures Procedures (including critical care time)  Medications Ordered in ED Medications  HYDROmorphone (DILAUDID) injection 1 mg (1 mg Intramuscular Given 11/02/17 0919)  ondansetron (ZOFRAN-ODT) disintegrating tablet 4 mg (4 mg Oral Given 11/02/17 0919)  dexamethasone (DECADRON) injection 10 mg (10 mg Intramuscular Given 11/02/17 1026)  ketorolac (TORADOL) 30 MG/ML injection 30 mg (30 mg Intramuscular Given 11/02/17 1025)  HYDROmorphone (DILAUDID) injection 0.5 mg (0.5 mg Intramuscular Given 11/02/17 1028)     Initial Impression / Assessment and Plan / ED Course  I have reviewed the triage vital signs and the nursing notes.  Pertinent labs & imaging results that were available during my care of the patient were reviewed by me and considered in my medical decision making (see chart for details).     Pt given dilaudid injection with mild improved sx.  Repeated dilaudid 0.5 mg along with decadron and toradol IM.  Sx improvement. No neuro deficit on exam or by history to suggest emergent or surgical presentation.  Discussed worsened sx that should prompt immediate re-evaluation including distal weakness, bowel/bladder retention/incontinence. Pt reports several falls in the past week, but with episodes of severe stabs of pain most c/w with  pain elicited and not weakness, esp since this is intermittent.  No clonus on exam, knee dtr's 1+ bilaterally but equal, no foot drop.   Advised need for close f/u with Dr. Ethelene Halamos. Has appt in 6 days, encouraged to get his prescription waiting for him started (oxycodone). Added robaxin, short course of prednisone, advised close watch on cbg's while on prednisone.         Final Clinical Impressions(s) / ED Diagnoses  Final diagnoses:  Lumbar radiculopathy    ED Discharge Orders        Ordered    predniSONE (DELTASONE) 20 MG tablet  Daily with breakfast     11/02/17 1053    methocarbamol (ROBAXIN-750) 750 MG tablet  4 times daily     11/02/17 1053       Burgess Amor, PA-C 11/02/17 1055    Donnetta Hutching, MD 11/02/17 [phone]

## 2017-11-02 NOTE — ED Triage Notes (Signed)
Pt has had ongoing back pain into right leg for several weeks.  Injection into back a week or so ago and MRI Thursday, but pain is unbearable.

## 2017-11-02 NOTE — Discharge Instructions (Signed)
Take your next dose of prednisone tomorrow morning and make sure you have keeping a close watch on your blood glucose levels while on the prednisone.  Get the oxycodone prescription picked up that Dr. Ethelene Halamos called in for you.   Do not drive within 4 hours of taking oxycodone as this will make you drowsy.  Avoid lifting,  Bending,  Twisting or any other activity that worsens your pain over the next week.  Apply heat to your back for 20 minutes several times daily.    You should get rechecked if your symptoms are not better over the next 5 days,  Or you develop increased pain,  Weakness in your leg(s) or loss of bladder or bowel function - these are symptoms of a worse injury.

## 2017-11-13 DIAGNOSIS — M545 Low back pain, unspecified: Secondary | ICD-10-CM | POA: Insufficient documentation

## 2017-11-13 DIAGNOSIS — M549 Dorsalgia, unspecified: Secondary | ICD-10-CM | POA: Insufficient documentation

## 2017-11-21 ENCOUNTER — Encounter (INDEPENDENT_AMBULATORY_CARE_PROVIDER_SITE_OTHER): Payer: Self-pay | Admitting: Diagnostic Neuroimaging

## 2017-11-21 ENCOUNTER — Ambulatory Visit (INDEPENDENT_AMBULATORY_CARE_PROVIDER_SITE_OTHER): Payer: BLUE CROSS/BLUE SHIELD | Admitting: Diagnostic Neuroimaging

## 2017-11-21 DIAGNOSIS — M545 Low back pain, unspecified: Secondary | ICD-10-CM

## 2017-11-21 DIAGNOSIS — Z0289 Encounter for other administrative examinations: Secondary | ICD-10-CM

## 2017-11-21 DIAGNOSIS — M79605 Pain in left leg: Secondary | ICD-10-CM

## 2017-11-21 DIAGNOSIS — M79604 Pain in right leg: Secondary | ICD-10-CM

## 2017-11-22 ENCOUNTER — Encounter (HOSPITAL_COMMUNITY): Payer: Self-pay | Admitting: Emergency Medicine

## 2017-11-22 ENCOUNTER — Emergency Department (HOSPITAL_COMMUNITY)
Admission: EM | Admit: 2017-11-22 | Discharge: 2017-11-22 | Disposition: A | Discharge status: Home / Self Care | Payer: BLUE CROSS/BLUE SHIELD | Attending: Emergency Medicine | Admitting: Emergency Medicine

## 2017-11-22 ENCOUNTER — Other Ambulatory Visit: Payer: Self-pay

## 2017-11-22 DIAGNOSIS — G8929 Other chronic pain: Secondary | ICD-10-CM

## 2017-11-22 DIAGNOSIS — M5441 Lumbago with sciatica, right side: Secondary | ICD-10-CM | POA: Diagnosis not present

## 2017-11-22 DIAGNOSIS — I10 Essential (primary) hypertension: Secondary | ICD-10-CM | POA: Diagnosis not present

## 2017-11-22 DIAGNOSIS — Z87891 Personal history of nicotine dependence: Secondary | ICD-10-CM | POA: Insufficient documentation

## 2017-11-22 DIAGNOSIS — E1140 Type 2 diabetes mellitus with diabetic neuropathy, unspecified: Secondary | ICD-10-CM | POA: Insufficient documentation

## 2017-11-22 DIAGNOSIS — M545 Low back pain: Secondary | ICD-10-CM | POA: Diagnosis present

## 2017-11-22 MED ORDER — PREDNISONE 50 MG PO TABS
50.0000 mg | ORAL_TABLET | Freq: Once | ORAL | Status: AC
  Administered 2017-11-22: 50 mg via ORAL
  Filled 2017-11-22: qty 1

## 2017-11-22 MED ORDER — PREDNISONE 20 MG PO TABS: 40.0000 mg | ORAL_TABLET | Freq: Every day | ORAL | 0 refills | Status: DC

## 2017-11-22 MED ORDER — METHOCARBAMOL 500 MG PO TABS: 500.0000 mg | ORAL_TABLET | Freq: Two times a day (BID) | ORAL | 0 refills | Status: DC

## 2017-11-22 MED ORDER — KETOROLAC TROMETHAMINE 30 MG/ML IJ SOLN
30.0000 mg | Freq: Once | INTRAMUSCULAR | Status: AC
  Administered 2017-11-22: 30 mg via INTRAMUSCULAR
  Filled 2017-11-22: qty 1

## 2017-11-22 MED ORDER — HYDROMORPHONE HCL 1 MG/ML IJ SOLN
1.0000 mg | Freq: Once | INTRAMUSCULAR | Status: AC
  Administered 2017-11-22: 1 mg via SUBCUTANEOUS
  Filled 2017-11-22: qty 1

## 2017-11-22 NOTE — ED Provider Notes (Signed)
[facility] Emergency Department Provider Note MRN:  [id number]  Arrival date & time: 11/22/17     Chief Complaint   Back Pain   History of Present Illness   [name] is a 52 y.o. year-old male with a history of DM, pancreatitis presenting to the ED with chief complaint of back pain.  Patient has been struggling with back pain for over a month.  The pain is located in the lower lumbar back and buttocks, bilaterally.  The pain radiates down the right leg.  The pain is constant, severe, worse with motion.  Patient has undergone MRI at the beginning of the month, steroid injection of the spine, more recent nerve function studies.  Denies fevers, no headache or vision change, no chest pain or shortness of breath, no abdominal pain.  No bowel or bladder dysfunction.  Endorsing more recent right leg weakness over the past week.  Review of Systems  A complete 10 system review of systems was obtained and all systems are negative except as noted in the HPI and PMH.   Patient's Health History    Past Medical History:  Diagnosis Date  . Diabetes mellitus without complication (HCC)   . Diabetic neuropathy (HCC) 03/25/2012  . Diverticulitis   . DM type 2 (diabetes mellitus, type 2) (HCC) 03/24/2012  . Hyperlipidemia 03/24/2012  . Hypertension   . Incisional hernia without mention of obstruction or gangrene 02/07/2011  . Pancreatitis   . Paroxysmal atrial fibrillation (HCC) 03/25/2012   Transient, occurred off of atenolol.  . Renal disorder     Past Surgical History:  Procedure Laterality Date  . APPENDECTOMY  04/15/2009  . COLON SURGERY  04/15/2009  . HERNIA REPAIR  01/25/11   lap ventral hernia repair x9  . LAPAROSCOPIC CHOLECYSTECTOMY    . LAPAROSCOPIC LYSIS OF ADHESIONS N/A 09/17/2012   Procedure: LAPAROSCOPIC and Open  LYSIS OF ADHESIONS;  Surgeon: Ardeth Sportsman, MD;  Location: [facility];  Service: General;  Laterality: N/A;  . VENTRAL HERNIA REPAIR N/A 09/17/2012   Procedure: LAPAROSCOPIC VENTRAL HERNIA;  Surgeon: Ardeth Sportsman, MD;  Location: [facility];  Service: General;  Laterality: N/A;    Family History  Problem Relation Age of Onset  . Hypertension Mother   . Hypertension Father     Social History   Socioeconomic History  . Marital status: Single    Spouse name: Not on file  . Number of children: Not on file  . Years of education: Not on file  . Highest education level: Not on file  Occupational History  . Not on file  Social Needs  . Financial resource strain: Not on file  . Food insecurity:    Worry: Not on file    Inability: Not on file  . Transportation needs:    Medical: Not on file    Non-medical: Not on file  Tobacco Use  . Smoking status: Former Smoker    Packs/day: 1.00    Years: 30.00    Pack years: 30.00    Types: Cigarettes  . Smokeless tobacco: Never Used  Substance and Sexual Activity  . Alcohol use: Yes  . Drug use: No  . Sexual activity: Yes    Partners: Female  Lifestyle  . Physical activity:    Days per week: Not on file    Minutes per session: Not on file  . Stress: Not on file  Relationships  . Social connections:    Talks on phone: Not on  file    Gets together: Not on file    Attends religious service: Not on file    Active member of club or organization: Not on file    Attends meetings of clubs or organizations: Not on file    Relationship status: Not on file  . Intimate partner violence:    Fear of current or ex partner: Not on file    Emotionally abused: Not on file    Physically abused: Not on file    Forced sexual activity: Not on file  Other Topics Concern  . Not on file  Social History Narrative  . Not on file     Physical Exam  Vital Signs and Nursing Notes reviewed Vitals:   11/22/17 2026  BP: (!) 182/114  Pulse: 88  Resp: 12  Temp: (!) 97.3 F (36.3 C)  SpO2: 99%    CONSTITUTIONAL: Well-appearing, NAD NEURO:  Alert and oriented x 3, poor effort with strength test of  right leg due to pain EYES:  eyes equal and reactive ENT/NECK:  no LAD, no JVD CARDIO: Regular rate, well-perfused, normal S1 and S2 PULM:  CTAB no wheezing or rhonchi GI/GU:  normal bowel sounds, non-distended, non-tender MSK/SPINE:  No gross deformities, no edema, tenderness to palpation to lower lumbar spine SKIN:  no rash, atraumatic PSYCH:  Appropriate speech and behavior  Diagnostic and Interventional Summary    EKG Interpretation  Date/Time:    Ventricular Rate:    PR Interval:    QRS Duration:   QT Interval:    QTC Calculation:   R Axis:     Text Interpretation:        Labs Reviewed - No data to display  No orders to display    Medications  HYDROmorphone (DILAUDID) injection 1 mg (1 mg Subcutaneous Given 11/22/17 2108)  ketorolac (TORADOL) 30 MG/ML injection 30 mg (30 mg Intramuscular Given 11/22/17 2107)  HYDROmorphone (DILAUDID) injection 1 mg (1 mg Subcutaneous Given 11/22/17 2158)  predniSONE (DELTASONE) tablet 50 mg (50 mg Oral Given 11/22/17 2158)     Procedures Critical Care  ED Course and Medical Decision Making  I have reviewed the triage vital signs and the nursing notes.  Pertinent labs & imaging results that were available during my care of the patient were reviewed by me and considered in my medical decision making (see below for details). Clinical Course as of Nov 23 2222  Fri Nov 22, 2017  [id number] 51 year old male history of diabetes presenting with acute on chronic back pain, present for over a month, claims MRI was performed earlier in the month but not on record here.  Vital signs stable, afebrile.  No bowel or bladder dysfunction.  Question of weakness to right leg which is fairly new, but may be more related to significant pain on the right side.  We will dose Dilaudid and Toradol and reassess neurological exam.  Will consider MRI if continues to be objectively weak.   [MB]  2136 Repeat exam after pain medication reveals normal strength and sensation to  bilateral lower extremities.  No concern for cord involvement.  Patient had significant pain relief with brief steroid burst earlier in the month, will provide additional steroid burst and have him follow-up with his orthopedist.   [MB]    Clinical Course User Index [MB] Sabas Sous, MD    Feeling much better after medications above, continued reassuring neurological exam.  After the discussed management above, the patient was determined to be safe for  discharge.  The patient was in agreement with this plan and all questions regarding their care were answered.  ED return precautions were discussed and the patient will return to the ED with any significant worsening of condition.  Elmer SowMichael M. Pilar PlateBero, MD [facility] [email or web address]  Final Clinical Impressions(s) / ED Diagnoses     ICD-10-CM   1. Chronic midline low back pain with right-sided sciatica M54.41    G89.29     ED Discharge Orders        Ordered    predniSONE (DELTASONE) 20 MG tablet  Daily with breakfast     11/22/17 2221    methocarbamol (ROBAXIN) 500 MG tablet  2 times daily     11/22/17 2221         Sabas SousBero, Michael M, MD 11/22/17 2224

## 2017-11-22 NOTE — ED Triage Notes (Signed)
Pt c/o of back and leg pain for two months. Pain in both legs.

## 2017-11-22 NOTE — Discharge Instructions (Addendum)
You were evaluated in the Emergency Department and after careful evaluation, we did not find any emergent condition requiring admission or further testing in the hospital.  Your symptoms today seem to be due to a flare of your back pain.  Please use medications provided as needed for pain and follow-up with your orthopedic specialist.  Please return to the Emergency Department if you experience any worsening of your condition.  We encourage you to follow up with a primary care provider.  Thank you for allowing [name] to be a part of your care.

## 2017-11-22 NOTE — Procedures (Signed)
[facility]  NCS (NERVE CONDUCTION STUDY) WITH EMG (ELECTROMYOGRAPHY) REPORT   STUDY DATE: 11/21/17 PATIENT NAME: [name] DOB: 21-May-1965 MRN: [id number]  ORDERING CLINICIAN: Pat Patrick, MD  TECHNOLOGIST: Charlesetta Ivory ELECTROMYOGRAPHER: Glenford Bayley. , MD  CLINICAL INFORMATION: 52 year old male with lower extremity numbness / pain. Evaluate for neuropathy.  FINDINGS: NERVE CONDUCTION STUDY:  Right ulnar motor response has prolonged distal latency, decreased amplitude and slow conduction velocity.  Bilateral tibial motor response of normal distal latencies, decreased amplitudes, slow conduction velocities.  Bilateral peroneal motor responses have normal distal latencies, decreased amplitudes and slow conduction velocities.   Right radial sensory response has decreased amplitude and normal peak latency.  Bilateral sural, bilateral superficial peroneal and right ulnar sensory responses could not be obtained.  Bilateral tibial F wave latencies are prolonged.  Right ulnar F-wave latency is prolonged.   NEEDLE ELECTROMYOGRAPHY:  Needle examination of right lower extremity and right lumbar paraspinal muscles is unremarkable.   IMPRESSION:   Abnormal study demonstrating: - Moderate-severe, length dependent, axonal sensorimotor polyneuropathy with demyelinating features.     INTERPRETING PHYSICIAN:  Suanne Marker, MD Certified in Neurology, Neurophysiology and Neuroimaging  [facility] [address] [place], [place] [address] [phone]   [facility]    Nerve / Sites Muscle Latency Ref. Amplitude Ref. Rel Amp Segments Distance Velocity Ref. Area    ms ms mV mV %  cm m/s m/s mVms  R Ulnar - ADM     Wrist ADM 4.7 ?3.3 2.6 ?6.0 100 Wrist - ADM 7   11.1     B.Elbow ADM 11.5  2.0  78.8 B.Elbow - Wrist 21 31 ?49 10.0     A.Elbow ADM 14.6  2.3  113 A.Elbow - B.Elbow 10 32 ?49 11.9         A.Elbow - Wrist      R  Peroneal - EDB     Ankle EDB 5.8 ?6.5 2.1 ?2.0 100 Ankle - EDB 9   7.6     Fib head EDB 16.0  1.7  79.9 Fib head - Ankle 34 33 ?44 6.7     Pop fossa EDB 19.0  1.5  90.4 Pop fossa - Fib head 10 34 ?44 6.3         Pop fossa - Ankle      L Peroneal - EDB     Ankle EDB 6.5 ?6.5 2.7 ?2.0 100 Ankle - EDB 9   9.9     Fib head EDB 17.6  2.0  73.9 Fib head - Ankle 34 31 ?44 7.1     Pop fossa EDB 20.7  1.5  76.4 Pop fossa - Fib head 10 32 ?44 6.2         Pop fossa - Ankle      R Tibial - AH     Ankle AH 5.4 ?5.8 2.2 ?4.0 100 Ankle - AH 9   7.1     Pop fossa AH 19.0  1.7  77.7 Pop fossa - Ankle 40 29 ?41 7.8  L Tibial - AH     Ankle AH 5.4 ?5.8 1.4 ?4.0 100 Ankle - AH 9   6.7     Pop fossa AH 18.6  0.9  61.6 Pop fossa - Ankle 40 30 ?41 7.0               SNC    Nerve / Sites Rec. Site Peak Lat Ref.  Amp  Ref. Segments Distance    ms ms V V  cm  R Radial - Anatomical snuff box (Forearm)     Forearm Wrist 2.9 ?2.9 2 ?15 Forearm - Wrist 10  R Sural - Ankle (Calf)     Calf Ankle NR ?4.4 NR ?6 Calf - Ankle 14  L Sural - Ankle (Calf)     Calf Ankle NR ?4.4 NR ?6 Calf - Ankle 14  R Superficial peroneal - Ankle     Lat leg Ankle NR ?4.4 NR ?6 Lat leg - Ankle 14  L Superficial peroneal - Ankle     Lat leg Ankle NR ?4.4 NR ?6 Lat leg - Ankle 14  R Ulnar - Orthodromic, (Dig V, Mid palm)     Dig V Wrist NR ?3.1 NR ?5 Dig V - Wrist 9011                 F  Wave    Nerve F Lat Ref.   ms ms  R Tibial - AH 69.3 ?56.0  L Tibial - AH 70.2 ?56.0  R Ulnar - ADM 42.1 ?32.0           EMG full       EMG Summary Table    Spontaneous MUAP Recruitment  Muscle IA Fib PSW Fasc Other Amp Dur. Poly Pattern  R. Vastus medialis Normal None None None _______ Normal Normal Normal Normal  R. Tibialis anterior Normal None None None _______ Normal Normal Normal Normal  R. Gastrocnemius (Medial head) Normal None None None _______ Normal Normal Normal Normal  R. Lumbar paraspinals Normal None None None _______ Normal  Normal Normal Normal

## 2017-11-28 DIAGNOSIS — M5136 Other intervertebral disc degeneration, lumbar region: Secondary | ICD-10-CM | POA: Insufficient documentation

## 2017-12-12 ENCOUNTER — Encounter: Payer: Self-pay | Admitting: Diagnostic Neuroimaging

## 2017-12-16 ENCOUNTER — Encounter: Payer: Self-pay | Admitting: Diagnostic Neuroimaging

## 2017-12-16 ENCOUNTER — Ambulatory Visit (INDEPENDENT_AMBULATORY_CARE_PROVIDER_SITE_OTHER): Payer: BLUE CROSS/BLUE SHIELD | Admitting: Diagnostic Neuroimaging

## 2017-12-16 VITALS — BP 131/93 | HR 82 | Ht 71.0 in | Wt 283.8 lb

## 2017-12-16 DIAGNOSIS — G629 Polyneuropathy, unspecified: Secondary | ICD-10-CM

## 2017-12-16 DIAGNOSIS — R269 Unspecified abnormalities of gait and mobility: Secondary | ICD-10-CM | POA: Diagnosis not present

## 2017-12-16 NOTE — Progress Notes (Signed)
[facility]  PATIENT: [name] DOB: 1965/06/27  REFERRING CLINICIAN: J Beane HISTORY FROM: patient  REASON FOR VISIT: new consult    HISTORICAL  CHIEF COMPLAINT:  Chief Complaint  Patient presents with  . Polyneuropathy    rm 6, New Pt, fiancee- Pam, "EMG/NCS in Aug, numbness/tingling in both legs up to my buttocks; getting injection in my back Friday- have had 1 already"    HISTORY OF PRESENT ILLNESS:   52 year old male here for evaluation of neuropathy.  History of diabetes.  May 2019 patient developed low back pain rating to the right leg.  Patient went to orthopedic clinic, had lumbar spine x-rays, which showed some degenerative changes, and then patient was referred here for EMG nerve conduction study, which showed moderate to severe polyneuropathy with axonal and monitoring features.  Patient then referred back for further neuropathy work-up.  Patient has significant weakness and decreased sensation in the lower extremities.  Patient also on gabapentin for pain.  Chart review of hospital records, indicates history of alcohol abuse, alcoholic hepatitis and alcoholic pancreatitis.  However patient denies any history of alcohol abuse.   REVIEW OF SYSTEMS: Full 14 system review of systems performed and negative with exception of: Numbness weakness joint pain.  ALLERGIES: Allergies  Allergen Reactions  . Hydrocodone Itching    HOME MEDICATIONS: Outpatient Medications Prior to Visit  Medication Sig Dispense Refill  . acetaminophen (TYLENOL) 500 MG tablet Take 1,000 mg by mouth 2 (two) times daily.     Marland Kitchen allopurinol (ZYLOPRIM) 300 MG tablet Take 300 mg by mouth daily.     Marland Kitchen amLODipine (NORVASC) 5 MG tablet Take 5 mg by mouth at bedtime.  2  . atenolol (TENORMIN) 100 MG tablet Take 100 mg by mouth daily.    . Cholecalciferol (VITAMIN D3) 5000 units CAPS Take 2 capsules by mouth daily.    . colchicine 0.6 MG tablet Take 0.6 mg by mouth at bedtime.     . gabapentin (NEURONTIN) 300 MG capsule Take 1 capsule by mouth at bedtime.   2  . Multiple Minerals-Vitamins (CALCIUM-MAGNESIUM-ZINC-D3 PO) Take 1 tablet by mouth daily.    Marland Kitchen olmesartan (BENICAR) 40 MG tablet 40 mg daily.  0  . omeprazole (PRILOSEC) 20 MG capsule Take 20 mg by mouth daily.    Marland Kitchen oxyCODONE-acetaminophen (PERCOCET/ROXICET) 5-325 MG tablet Take 1 tablet by mouth every 4 (four) hours as needed. 30 tablet 0  . polyethylene glycol (MIRALAX) packet Take 17 g by mouth daily as needed for moderate constipation. 10 each 0  . pravastatin (PRAVACHOL) 20 MG tablet Take 20 mg by mouth daily.    Nathen May MAX SOLOSTAR 300 UNIT/ML SOPN Inject 130 Units into the skin daily.   5  . folic acid (FOLVITE) 1 MG tablet Take 1 tablet (1 mg total) by mouth daily. (Patient not taking: Reported on 10/17/2017) 30 tablet 0  . meloxicam (MOBIC) 15 MG tablet Take 15 mg by mouth daily.    . methocarbamol (ROBAXIN) 500 MG tablet Take 1 tablet (500 mg total) by mouth 2 (two) times daily. (Patient not taking: Reported on 12/16/2017) 20 tablet 0  . naproxen (NAPROSYN) 375 MG tablet Take 1 tablet (375 mg total) by mouth 2 (two) times daily as needed. (Patient not taking: Reported on 10/17/2017) 30 tablet 0  . thiamine 100 MG tablet Take 1 tablet (100 mg total) by mouth daily. (Patient not taking: Reported on 10/17/2017) 30 tablet 0  . traMADol (ULTRAM) 50 MG  tablet Take 1 tablet (50 mg total) by mouth every 6 (six) hours as needed for moderate pain. (Patient not taking: Reported on 10/17/2017) 14 tablet 0  . docusate sodium (COLACE) 100 MG capsule Take 1 capsule (100 mg total) by mouth daily. 14 capsule 0  . lisinopril (PRINIVIL,ZESTRIL) 20 MG tablet Take 20 mg by mouth daily.  5  . methocarbamol (ROBAXIN-750) 750 MG tablet Take 1 tablet (750 mg total) by mouth 4 (four) times daily. 28 tablet 0  . ondansetron (ZOFRAN) 4 MG tablet Take 1 tablet (4 mg total) by mouth daily as needed for nausea or vomiting. (Patient not  taking: Reported on 10/17/2017) 20 tablet 0  . predniSONE (DELTASONE) 20 MG tablet Take 2 tablets (40 mg total) by mouth daily with breakfast. 8 tablet 0   No facility-administered medications prior to visit.     PAST MEDICAL HISTORY: Past Medical History:  Diagnosis Date  . Degeneration of lumbar intervertebral disc   . Diabetes mellitus without complication (HCC)   . Diabetic neuropathy (HCC) 03/25/2012  . Diverticulitis   . DM type 2 (diabetes mellitus, type 2) (HCC) 03/24/2012  . Gout    hx of  . Hyperlipidemia 03/24/2012  . Hypertension   . Incisional hernia without mention of obstruction or gangrene 02/07/2011  . Pancreatitis   . Paroxysmal atrial fibrillation (HCC) 03/25/2012   Transient, occurred off of atenolol.  . Renal disorder     PAST SURGICAL HISTORY: Past Surgical History:  Procedure Laterality Date  . APPENDECTOMY  04/15/2009  . COLON SURGERY  04/15/2009  . HERNIA REPAIR  01/25/11   lap ventral hernia repair x9  . LAPAROSCOPIC CHOLECYSTECTOMY  1993  . LAPAROSCOPIC LYSIS OF ADHESIONS N/A 09/17/2012   Procedure: LAPAROSCOPIC and Open  LYSIS OF ADHESIONS;  Surgeon: Ardeth Sportsman, MD;  Location: [facility];  Service: General;  Laterality: N/A;  . VENTRAL HERNIA REPAIR N/A 09/17/2012   Procedure: LAPAROSCOPIC VENTRAL HERNIA;  Surgeon: Ardeth Sportsman, MD;  Location: [facility];  Service: General;  Laterality: N/A;    FAMILY HISTORY: Family History  Problem Relation Age of Onset  . Hypertension Mother   . Hypertension Father     SOCIAL HISTORY: Social History   Socioeconomic History  . Marital status: Single    Spouse name: Not on file  . Number of children: 2  . Years of education: 52  . Highest education level: Not on file  Occupational History    Comment: NA  Social Needs  . Financial resource strain: Not on file  . Food insecurity:    Worry: Not on file    Inability: Not on file  . Transportation needs:    Medical: Not on file    Non-medical: Not on  file  Tobacco Use  . Smoking status: Former Smoker    Packs/day: 1.00    Years: 30.00    Pack years: 30.00    Types: Cigarettes    Last attempt to quit: 05/18/2010    Years since quitting: 7.5  . Smokeless tobacco: Never Used  Substance and Sexual Activity  . Alcohol use: Yes    Comment: "moderate"  . Drug use: No  . Sexual activity: Yes    Partners: Female  Lifestyle  . Physical activity:    Days per week: Not on file    Minutes per session: Not on file  . Stress: Not on file  Relationships  . Social connections:    Talks on phone: Not on file  Gets together: Not on file    Attends religious service: Not on file    Active member of club or organization: Not on file    Attends meetings of clubs or organizations: Not on file    Relationship status: Not on file  . Intimate partner violence:    Fear of current or ex partner: Not on file    Emotionally abused: Not on file    Physically abused: Not on file    Forced sexual activity: Not on file  Other Topics Concern  . Not on file  Social History Narrative   Lives with fiancee   Caffeine - sodas, 3-4 daily     PHYSICAL EXAM  GENERAL EXAM/CONSTITUTIONAL: Vitals:  Vitals:   12/16/17 1437  BP: (!) 131/93  Pulse: 82  Weight: 283 lb 12.8 oz (128.7 kg)  Height: 5\' 11"  (1.803 m)     Body mass index is 39.58 kg/m. Wt Readings from Last 3 Encounters:  12/16/17 283 lb 12.8 oz (128.7 kg)  11/02/17 298 lb (135.2 kg)  10/17/17 298 lb (135.2 kg)     Patient is in no distress; well developed, nourished and groomed; neck is supple  CARDIOVASCULAR:  Examination of carotid arteries is normal; no carotid bruits  Regular rate and rhythm, no murmurs  Examination of peripheral vascular system by observation and palpation is normal  EYES:  Ophthalmoscopic exam of optic discs and posterior segments is normal; no papilledema or hemorrhages  Visual Acuity Screening   Right eye Left eye Both eyes  Without correction:      With correction: 20/30 20/40      MUSCULOSKELETAL:  Gait, strength, tone, movements noted in Neurologic exam below  NEUROLOGIC: MENTAL STATUS:  No flowsheet data found.  awake, alert, oriented to person, place and time  recent and remote memory intact  normal attention and concentration  language fluent, comprehension intact, naming intact  fund of knowledge appropriate  CRANIAL NERVE:   2nd - no papilledema on fundoscopic exam  2nd, 3rd, 4th, 6th - pupils equal and reactive to light, visual fields full to confrontation, extraocular muscles intact, no nystagmus  5th - facial sensation symmetric  7th - facial strength symmetric  8th - hearing intact  9th - palate elevates symmetrically, uvula midline  11th - shoulder shrug symmetric  12th - tongue protrusion midline  MOTOR:   normal bulk and tone, full strength in the BUE  BLE (RIGHT HF 4, RIGHT KNEE EXT / FLEX 4, RIGHT DF 5; LEFT HF 2-3, LEFT KNEE EXT/FLEX 3, DF 4)  SENSORY:   normal and symmetric to light touch; DECR PP, TEMP VIB IN FEET AND LOWER LEGS  COORDINATION:   finger-nose-finger, fine finger movements normal  REFLEXES:   deep tendon reflexes TRACE and symmetric; ABSENT IN LEGS  GAIT/STATION:   ANTALGIC GAIT; LIMPING; USING CANE     DIAGNOSTIC DATA (LABS, IMAGING, TESTING) - I reviewed patient records, labs, notes, testing and imaging myself where available.  Lab Results  Component Value Date   WBC 6.6 08/28/2017   HGB 13.5 08/28/2017   HCT 39.3 08/28/2017   MCV 100.8 (H) 08/28/2017   PLT 149 (L) 08/28/2017      Component Value Date/Time   NA 138 08/28/2017 0514   K 3.9 08/28/2017 0514   CL 107 08/28/2017 0514   CO2 24 08/28/2017 0514   GLUCOSE 96 08/28/2017 0514   BUN 9 08/28/2017 0514   CREATININE 0.69 08/28/2017 0514   CALCIUM 8.2 (L)  08/28/2017 0514   PROT 7.2 08/26/2017 1136   ALBUMIN 3.5 08/26/2017 1136   AST 25 08/26/2017 1136   ALT 22 08/26/2017 1136    ALKPHOS 54 08/26/2017 1136   BILITOT 0.8 08/26/2017 1136   GFRNONAA >60 08/28/2017 0514   GFRAA >60 08/28/2017 0514   Lab Results  Component Value Date   CHOL 232 (H) 03/23/2012   HDL 43 03/23/2012   LDLCALC 119 (H) 03/23/2012   TRIG 348 (H) 03/23/2012   CHOLHDL 5.4 03/23/2012   Lab Results  Component Value Date   HGBA1C 7.8 (H) 09/18/2012   Lab Results  Component Value Date   VITAMINB12 449 01/25/2009   Lab Results  Component Value Date   TSH 0.949 03/24/2012     11/21/17 EMG/NCS - Moderate-severe, length dependent, axonal sensorimotor polyneuropathy with demyelinating features.   ASSESSMENT AND PLAN  52 y.o. year old male here with progressive lower extremity numbness and weakness since May 2019, in the setting of diabetes and low back pain, here for evaluation.   Dx: right lumbar radiculopathy + diabetic neuropathy (+/- diabetic amyotrophy) + obesity neuropathy + left knee pain  1. Neuropathy       PLAN:  I spent 40 minutes of face to face time with patient. Greater than 50% of time was spent in counseling and coordination of care with patient. This is necessary because patient's symptoms are not optimally controlled / improved. In summary we discussed: - Diagnostic results, impressions, or recommended diagnostic studies: EMG results reviewed - Prognosis: guarded   - check neuropathy / myopathy labs - repeat MRI lumbar spine w/wo - check LP (eval for CIDP); then may consider trial of IVIG - continue pain mgmt per Dr. Ethelene Hal  Orders Placed This Encounter  Procedures  . For home use only DME 4 wheeled rolling walker with seat  . MR Lumbar Spine W Wo Contrast  . DG FLUORO GUIDED LOC OF NEEDLE/CATH TIP FOR SPINAL INJECT LT  . CBC with Differential/Platelet  . Comprehensive metabolic panel  . Hemoglobin A1c  . TSH  . Vitamin B12  . CK  . Aldolase   Return pending test results.    Suanne Marker, MD 12/16/2017, 3:22 PM Certified in Neurology,  Neurophysiology and Neuroimaging  [facility] [address], [address] [place], [place] [address] [phone]

## 2017-12-17 LAB — CBC WITH DIFFERENTIAL/PLATELET
BASOS: 1 %
Basophils Absolute: 0.1 10*3/uL (ref 0.0–0.2)
EOS (ABSOLUTE): 0.1 10*3/uL (ref 0.0–0.4)
Eos: 1 %
HEMOGLOBIN: 16.2 g/dL (ref 13.0–17.7)
Hematocrit: 46.9 % (ref 37.5–51.0)
IMMATURE GRANS (ABS): 0 10*3/uL (ref 0.0–0.1)
Immature Granulocytes: 0 %
LYMPHS: 34 %
Lymphocytes Absolute: 3.7 10*3/uL — ABNORMAL HIGH (ref 0.7–3.1)
MCH: 33.3 pg — ABNORMAL HIGH (ref 26.6–33.0)
MCHC: 34.5 g/dL (ref 31.5–35.7)
MCV: 97 fL (ref 79–97)
MONOCYTES: 9 %
Monocytes Absolute: 1 10*3/uL — ABNORMAL HIGH (ref 0.1–0.9)
NEUTROS ABS: 5.9 10*3/uL (ref 1.4–7.0)
Neutrophils: 55 %
Platelets: 241 10*3/uL (ref 150–450)
RBC: 4.86 x10E6/uL (ref 4.14–5.80)
RDW: 12.5 % (ref 12.3–15.4)
WBC: 10.7 10*3/uL (ref 3.4–10.8)

## 2017-12-17 LAB — COMPREHENSIVE METABOLIC PANEL
ALBUMIN: 4.5 g/dL (ref 3.5–5.5)
ALT: 28 IU/L (ref 0–44)
AST: 30 IU/L (ref 0–40)
Albumin/Globulin Ratio: 1.4 (ref 1.2–2.2)
Alkaline Phosphatase: 63 IU/L (ref 39–117)
BILIRUBIN TOTAL: 0.6 mg/dL (ref 0.0–1.2)
BUN / CREAT RATIO: 15 (ref 9–20)
BUN: 13 mg/dL (ref 6–24)
CALCIUM: 9.9 mg/dL (ref 8.7–10.2)
CHLORIDE: 96 mmol/L (ref 96–106)
CO2: 22 mmol/L (ref 20–29)
Creatinine, Ser: 0.88 mg/dL (ref 0.76–1.27)
GFR, EST AFRICAN AMERICAN: 113 mL/min/{1.73_m2} (ref >59)
GFR, EST NON AFRICAN AMERICAN: 97 mL/min/{1.73_m2} (ref >59)
GLUCOSE: 219 mg/dL — AB (ref 65–99)
Globulin, Total: 3.2 g/dL (ref 1.5–4.5)
Potassium: 4.8 mmol/L (ref 3.5–5.2)
Sodium: 139 mmol/L (ref 134–144)
TOTAL PROTEIN: 7.7 g/dL (ref 6.0–8.5)

## 2017-12-17 LAB — CK: CK TOTAL: 150 U/L (ref 24–204)

## 2017-12-17 LAB — HEMOGLOBIN A1C
Est. average glucose Bld gHb Est-mCnc: 200 mg/dL
Hgb A1c MFr Bld: 8.6 % — ABNORMAL HIGH (ref 4.8–5.6)

## 2017-12-17 LAB — VITAMIN B12: VITAMIN B 12: 273 pg/mL (ref 232–1245)

## 2017-12-17 LAB — TSH: TSH: 1.63 u[IU]/mL (ref 0.450–4.500)

## 2017-12-17 LAB — ALDOLASE: Aldolase: 8.7 U/L (ref 3.3–10.3)

## 2017-12-20 ENCOUNTER — Telehealth: Payer: Self-pay | Admitting: *Deleted

## 2017-12-20 NOTE — Telephone Encounter (Signed)
LVM informing patient that his labs showed his diabetes is slightly worsening. Otherwise the results are unremarkable. Advised him that he needs to schedule his MRI. Gave office number and requested he call back before 12 noon to schedule MRI.

## 2017-12-25 ENCOUNTER — Telehealth: Payer: Self-pay | Admitting: Diagnostic Neuroimaging

## 2017-12-25 NOTE — Telephone Encounter (Signed)
[employer] Auth: [id number] (exp. 12/25/17 to 01/23/18) order faxed to [facility] they will reach out to the pt to schedule.

## 2017-12-26 ENCOUNTER — Telehealth: Payer: Self-pay | Admitting: Diagnostic Neuroimaging

## 2017-12-26 MED ORDER — ALPRAZOLAM 0.5 MG PO TABS: 0.5000 mg | ORAL_TABLET | ORAL | 0 refills | Status: DC | PRN

## 2017-12-26 NOTE — Telephone Encounter (Signed)
Meds ordered this encounter  Medications  . ALPRAZolam (XANAX) 0.5 MG tablet    Sig: Take 1 tablet (0.5 mg total) by mouth as needed for anxiety (for sedation before MRI scan; take 1 hour before scan; may repeat 15 min before scan).    Dispense:  3 tablet    Refill:  0    Suanne Marker, MD 12/26/2017, 3:04 PM Certified in Neurology, Neurophysiology and Neuroimaging  [facility] [address], [address] [place], [place] [address] [phone]

## 2017-12-26 NOTE — Addendum Note (Signed)
Addended by: Joycelyn Schmid R on: 12/26/2017 03:04 PM   Modules accepted: Orders

## 2017-12-26 NOTE — Telephone Encounter (Signed)
Pt states re: his upcoming MRI pt will feel claustrophobic, he is asking that something be called into  [facility] [address] [place], [place] - [place] [address] Laurell Josephs [phone] (Phone) [phone] (Fax)

## 2017-12-26 NOTE — Telephone Encounter (Signed)
I spoke to pt and fax received confirmation xanax for [facility] [phone]. Pt aware needs driver.  Instructed on administration.  He verbalized understanding.

## 2018-01-02 ENCOUNTER — Ambulatory Visit
Admission: RE | Admit: 2018-01-02 | Discharge: 2018-01-02 | Disposition: A | Discharge status: Home / Self Care | Payer: BLUE CROSS/BLUE SHIELD | Source: Ambulatory Visit | Attending: Diagnostic Neuroimaging | Admitting: Diagnostic Neuroimaging

## 2018-01-02 ENCOUNTER — Telehealth: Payer: Self-pay | Admitting: *Deleted

## 2018-01-02 ENCOUNTER — Other Ambulatory Visit: Payer: BLUE CROSS/BLUE SHIELD

## 2018-01-02 VITALS — BP 153/94 | HR 76

## 2018-01-02 DIAGNOSIS — G629 Polyneuropathy, unspecified: Secondary | ICD-10-CM

## 2018-01-02 DIAGNOSIS — R269 Unspecified abnormalities of gait and mobility: Secondary | ICD-10-CM

## 2018-01-02 NOTE — Discharge Instructions (Signed)

## 2018-01-02 NOTE — Telephone Encounter (Signed)
Specimen Collected: 01/02/18 09:53 Last Resulted: 01/02/18 12:03   Spoke to Rin, with [facility].   Total Protein, CSF 15 - 45 mg/dL [id number]     Glucose, CSF 40 - 80 mg/dL [id number]     Color, CSF COLORLESS COLORLESS   Appearance, CSF CLEAR CLEAR   RBC Count, CSF 0 - 10 cells/uL 1   WBC, CSF 0 - 5 cells/uL 2   Segmented Neutrophils-CSF  CANCELED   Comment: Unable to report due to  insufficient analyzable cells.

## 2018-01-02 NOTE — Telephone Encounter (Signed)
MRI lumbar report and LP results reviewed. With EMG results and symptoms, patient likely has CIDP. Will proceed with IVIG. _VRP

## 2018-01-06 LAB — PROTEIN, CSF: TOTAL PROTEIN, CSF: 132 mg/dL — AB (ref 15–45)

## 2018-01-06 LAB — CSF CULTURE W GRAM STAIN
MICRO NUMBER:: 91093781
SPECIMEN QUALITY:: ADEQUATE

## 2018-01-06 LAB — CSF CELL COUNT WITH DIFFERENTIAL
RBC Count, CSF: 1 cells/uL (ref 0–10)
WBC CSF: 2 {cells}/uL (ref 0–5)

## 2018-01-06 LAB — CSF CULTURE: RESULT: NO GROWTH

## 2018-01-06 LAB — GLUCOSE, CSF: Glucose, CSF: 111 mg/dL — ABNORMAL HIGH (ref 40–80)

## 2018-01-06 NOTE — Telephone Encounter (Signed)
I called and reviewed test results.  Most likely represents CIDP.  We will proceed with IVIG therapy.  Benefits and side effects were reviewed with patient.  Suanne MarkerVIKRAM R. , MD 01/06/2018, 5:24 PM Certified in Neurology, Neurophysiology and Neuroimaging  [facility] [address], [address] [place], [place] [id number] [phone]

## 2018-01-07 NOTE — Telephone Encounter (Signed)
Spoke to [employer] IncPam, girfriend (on [employer]) of plan.  Gave results of labs and CSF, most likely represents (CIDP) per Dr. Marjory LiesPenumalli and will proceed wth IVIG. She verbalized understanding.  Asking about MRI results. I will call her back on this.

## 2018-01-07 NOTE — Telephone Encounter (Signed)
Watkins,Pam(on DPR) has called asking to be told what Dr Marjory LiesPenumalli explained to pt.  Isabella StallingWatkins,Pam states pt is not very clear on what he was told.  Please call

## 2018-01-07 NOTE — Telephone Encounter (Signed)
-   MRI lumbar spine shows --> multi-level degenerative arthritis; mild pinched nerves and spinal stenosis - no enough to explain severity of pts symptoms; CIDP is a better explanation.  -VRP

## 2018-01-08 ENCOUNTER — Telehealth: Payer: Self-pay | Admitting: Diagnostic Neuroimaging

## 2018-01-08 NOTE — Telephone Encounter (Signed)
Continue pain mgmt per Dr. Ethelene Halamos. Ok to take NSAIDs as needed.Hopefully symptoms and pain will improve with IVIG. -VRP

## 2018-01-08 NOTE — Telephone Encounter (Signed)
[facility] and gave her the MRI results.  She verbalized understanding.  She did have questions of who to speak to relating to his pain management.  Dr. Ethelene Halamos has performed injections into his back.  Was given oxycontin for pain, was taken off mobic prior to back injections and has not restarted.  I relayed that I would speak to Dr. Ramos's/ [facility] office concerning his pain management, restarting mobic.  She will call.  Is it ok to take nsaids with cidp? Will IVIG help with pain?  Will ask.

## 2018-01-08 NOTE — Telephone Encounter (Signed)
Spoke to Campbell Soupiffany with [facility]. Needs MRI report and [place]/EMG report.  There is no VDRL or nerve bx.  Once order for IVIG completed by Dr. Marjory LiesPenumalli then will fax to her at [phone].

## 2018-01-08 NOTE — Telephone Encounter (Signed)
Spoke to [employer]am.  Relayed per below.  She will f/u for pt with Dr. Ethelene Halamos for PM.  She asked about next appt.  I will call her when IVIG is approved and let her know then when to schedule.

## 2018-01-08 NOTE — Telephone Encounter (Signed)
Tiffany with [facility] calling regarding IVIG referral.

## 2018-01-08 NOTE — Telephone Encounter (Addendum)
Fax confirmation received [phone] (emg/ mri and orders).   Per Dr. Marjory LiesPenumalli 6-8 wks f/u after initial dose of IVIG.  (eat healthy diet).  Elevated protein in csf was not related to diet.

## 2018-01-13 NOTE — Telephone Encounter (Signed)
[facility] from [facility] [phone], fax [phone] DX G61.81 CIDP. LOADING: IVIG 200gms (2g/kg) Total IV dose over 5 days, then MAINTENANCE :  100gms, (1 gm/kg) Total dose over 2 days.   Repeat infusion every 3 wks.   Premed with tylenol 650mg  po x 1 and benadryl 25mg  po x 1.   To be signed and faxed back.

## 2018-01-13 NOTE — Telephone Encounter (Signed)
Signed and fax confirmation received [phone] [facility]  [phone].

## 2018-01-14 ENCOUNTER — Encounter (HOSPITAL_COMMUNITY): Payer: Self-pay | Admitting: Emergency Medicine

## 2018-01-14 ENCOUNTER — Emergency Department (HOSPITAL_COMMUNITY)
Admission: EM | Admit: 2018-01-14 | Discharge: 2018-01-15 | Disposition: A | Discharge status: Home / Self Care | Payer: BLUE CROSS/BLUE SHIELD | Attending: Emergency Medicine | Admitting: Emergency Medicine

## 2018-01-14 ENCOUNTER — Other Ambulatory Visit: Payer: Self-pay

## 2018-01-14 DIAGNOSIS — M79604 Pain in right leg: Secondary | ICD-10-CM | POA: Insufficient documentation

## 2018-01-14 DIAGNOSIS — I10 Essential (primary) hypertension: Secondary | ICD-10-CM | POA: Diagnosis not present

## 2018-01-14 DIAGNOSIS — E119 Type 2 diabetes mellitus without complications: Secondary | ICD-10-CM | POA: Diagnosis not present

## 2018-01-14 DIAGNOSIS — M79605 Pain in left leg: Secondary | ICD-10-CM | POA: Diagnosis not present

## 2018-01-14 DIAGNOSIS — Z79899 Other long term (current) drug therapy: Secondary | ICD-10-CM | POA: Insufficient documentation

## 2018-01-14 DIAGNOSIS — Z87891 Personal history of nicotine dependence: Secondary | ICD-10-CM | POA: Diagnosis not present

## 2018-01-14 DIAGNOSIS — E1140 Type 2 diabetes mellitus with diabetic neuropathy, unspecified: Secondary | ICD-10-CM | POA: Insufficient documentation

## 2018-01-14 MED ORDER — DEXAMETHASONE SODIUM PHOSPHATE 10 MG/ML IJ SOLN
10.0000 mg | Freq: Once | INTRAMUSCULAR | Status: AC
  Administered 2018-01-15: 10 mg via INTRAMUSCULAR
  Filled 2018-01-14: qty 1

## 2018-01-14 MED ORDER — HYDROMORPHONE HCL 1 MG/ML IJ SOLN
1.0000 mg | Freq: Once | INTRAMUSCULAR | Status: AC
  Administered 2018-01-15: 1 mg via INTRAMUSCULAR
  Filled 2018-01-14: qty 1

## 2018-01-14 MED ORDER — HYDROMORPHONE HCL 1 MG/ML IJ SOLN
1.0000 mg | Freq: Once | INTRAMUSCULAR | Status: AC
  Administered 2018-01-14: 1 mg via INTRAMUSCULAR
  Filled 2018-01-14: qty 1

## 2018-01-14 NOTE — ED Triage Notes (Signed)
Patient recently diagnosed with CIDP and is to start IV therapy, but home health has not started treatment yet, and is having lower back pain, hip pain difficulty walking, not relieved by home medications.

## 2018-01-14 NOTE — ED Provider Notes (Signed)
[facility] EMERGENCY DEPARTMENT Provider Note   CSN: [id number] Arrival date & time: 01/14/18  2145     History   Chief Complaint Chief Complaint  Patient presents with  . Back Pain    HPI Brian SpinnerDavid A [name] is a 52 y.o. male a history of diabetes, hypertension, paroxysmal A. fib and recent diagnosis of CIDP who is in the process of arranging home health for IVIG infusions presenting with bilateral stabbing, aching pain in his upper legs along with persistent weakness which goes along with this diagnosis. He is followed by Dr. Marjory LiesPenumalli for this condition.  He has taken oxycodone recently for his pain which will give relief for about 2 hours, but has been getting poor sleep secondary to pain. His gabapentin has also been increased with equivocal improvement.  He and wife state he has had several falls over the past month given weakness in his proximal thighs but denies any injury or pain related to falls.  He has a walker and also a wheelchair for home use as needed.  He has found no alleviators for the pain in his legs.  The history is provided by the patient.    Past Medical History:  Diagnosis Date  . Degeneration of lumbar intervertebral disc   . Diabetes mellitus without complication (HCC)   . Diabetic neuropathy (HCC) 03/25/2012  . Diverticulitis   . DM type 2 (diabetes mellitus, type 2) (HCC) 03/24/2012  . Gout    hx of  . Hyperlipidemia 03/24/2012  . Hypertension   . Incisional hernia without mention of obstruction or gangrene 02/07/2011  . Pancreatitis   . Paroxysmal atrial fibrillation (HCC) 03/25/2012   Transient, occurred off of atenolol.  . Renal disorder     Patient Active Problem List   Diagnosis Date Noted  . Degeneration of lumbar intervertebral disc 11/28/2017  . Back pain 11/13/2017  . Partial small bowel obstruction (HCC) 08/26/2017  . GERD (gastroesophageal reflux disease) 08/26/2017  . Diabetes mellitus type 2, uncontrolled, with complications (HCC)  12/15/2012  . Diabetic neuropathy (HCC) 03/25/2012  . Pancreatitis, acute 03/23/2012  . Hyponatremia 03/23/2012  . Alcohol abuse 03/23/2012  . GOUT 05/30/2009  . Morbid obesity (HCC) 05/30/2009  . Tobacco abuse 05/30/2009  . Essential hypertension 05/30/2009    Past Surgical History:  Procedure Laterality Date  . APPENDECTOMY  04/15/2009  . COLON SURGERY  04/15/2009  . HERNIA REPAIR  01/25/11   lap ventral hernia repair x9  . LAPAROSCOPIC CHOLECYSTECTOMY  1993  . LAPAROSCOPIC LYSIS OF ADHESIONS N/A 09/17/2012   Procedure: LAPAROSCOPIC and Open  LYSIS OF ADHESIONS;  Surgeon: Ardeth SportsmanSteven C. Gross, MD;  Location: [facility];  Service: General;  Laterality: N/A;  . VENTRAL HERNIA REPAIR N/A 09/17/2012   Procedure: LAPAROSCOPIC VENTRAL HERNIA;  Surgeon: Ardeth SportsmanSteven C. Gross, MD;  Location: [facility];  Service: General;  Laterality: N/A;        Home Medications    Prior to Admission medications   Medication Sig Start Date End Date Taking? Authorizing Provider  acetaminophen (TYLENOL) 500 MG tablet Take 1,000 mg by mouth 2 (two) times daily.     [provider]  allopurinol (ZYLOPRIM) 300 MG tablet Take 300 mg by mouth daily.  01/21/11   [provider]  ALPRAZolam Prudy Feeler(XANAX) 0.5 MG tablet Take 1 tablet (0.5 mg total) by mouth as needed for anxiety (for sedation before MRI scan; take 1 hour before scan; may repeat 15 min before scan). 12/26/17   Penumalli, Glenford BayleyVikram R, MD  amLODipine (NORVASC) 5 MG tablet Take 5 mg by mouth at bedtime. 10/06/17   [provider]  atenolol (TENORMIN) 100 MG tablet Take 100 mg by mouth daily.    [provider]  Cholecalciferol (VITAMIN D3) 5000 units CAPS Take 2 capsules by mouth daily.    [provider]  colchicine 0.6 MG tablet Take 0.6 mg by mouth at bedtime.    [provider]  folic acid (FOLVITE) 1 MG tablet Take 1 tablet (1 mg total) by mouth daily. Patient not taking: Reported on 10/17/2017 08/29/17   Glade Lloyd, MD    gabapentin (NEURONTIN) 300 MG capsule Take 1 capsule by mouth at bedtime.  10/06/17   [provider]  HYDROmorphone (DILAUDID) 2 MG tablet Take 1 tablet (2 mg total) by mouth every 4 (four) hours as needed for severe pain. 01/15/18   Burgess Amor, PA-C  meloxicam (MOBIC) 15 MG tablet Take 15 mg by mouth daily.    [provider]  methocarbamol (ROBAXIN) 500 MG tablet Take 1 tablet (500 mg total) by mouth 2 (two) times daily. Patient not taking: Reported on 12/16/2017 11/22/17   Sabas Sous, MD  Multiple Minerals-Vitamins (CALCIUM-MAGNESIUM-ZINC-D3 PO) Take 1 tablet by mouth daily.    [provider]  naproxen (NAPROSYN) 375 MG tablet Take 1 tablet (375 mg total) by mouth 2 (two) times daily as needed. Patient not taking: Reported on 10/17/2017 09/29/17   Glynn Octave, MD  olmesartan (BENICAR) 40 MG tablet 40 mg daily. 12/15/17   [provider]  omeprazole (PRILOSEC) 20 MG capsule Take 20 mg by mouth daily.    [provider]  oxyCODONE-acetaminophen (PERCOCET/ROXICET) 5-325 MG tablet Take 1 tablet by mouth every 4 (four) hours as needed. 10/17/17   Burgess Amor, PA-C  polyethylene glycol (MIRALAX) packet Take 17 g by mouth daily as needed for moderate constipation. 08/28/17   Glade Lloyd, MD  pravastatin (PRAVACHOL) 20 MG tablet Take 20 mg by mouth daily.    [provider]  predniSONE (DELTASONE) 20 MG tablet Take 2 tablets (40 mg total) by mouth daily for 4 doses. 01/14/18 01/18/18  Burgess Amor, PA-C  thiamine 100 MG tablet Take 1 tablet (100 mg total) by mouth daily. Patient not taking: Reported on 10/17/2017 08/29/17   Glade Lloyd, MD  TOUJEO MAX SOLOSTAR 300 UNIT/ML SOPN Inject 130 Units into the skin daily.  08/18/17   [provider]  traMADol (ULTRAM) 50 MG tablet Take 1 tablet (50 mg total) by mouth every 6 (six) hours as needed for moderate pain. Patient not taking: Reported on 10/17/2017 08/28/17 08/28/18  Glade Lloyd, MD     Family History Family History  Problem Relation Age of Onset  . Hypertension Mother   . Hypertension Father     Social History Social History   Tobacco Use  . Smoking status: Former Smoker    Packs/day: 1.00    Years: 30.00    Pack years: 30.00    Types: Cigarettes    Last attempt to quit: 05/18/2010    Years since quitting: 7.6  . Smokeless tobacco: Never Used  Substance Use Topics  . Alcohol use: Yes    Comment: "moderate"  . Drug use: No     Allergies   Hydrocodone   Review of Systems Review of Systems  Constitutional: Negative for fever.  Musculoskeletal: Positive for arthralgias. Negative for joint swelling and myalgias.  Neurological: Positive for weakness. Negative for numbness.     Physical Exam  Updated Vital Signs BP 137/78   Pulse 84   Temp 98.1 F (36.7 C) (Oral)   Resp 20   Ht 5\' 11"  (1.803 m)   Wt 129.3 kg   SpO2 97%   BMI 39.75 kg/m   Physical Exam  Constitutional: He is oriented to person, place, and time. He appears well-developed and well-nourished.  HENT:  Head: Normocephalic.  Eyes: Conjunctivae are normal.  Neck: Normal range of motion. Neck supple.  Cardiovascular: Normal rate and intact distal pulses.  Pedal pulses normal. Trace bilateral ankle edema.  Pulmonary/Chest: Effort normal.  Abdominal: Soft. Bowel sounds are normal. He exhibits no distension and no mass.  Musculoskeletal: He exhibits no edema.       Lumbar back: He exhibits tenderness. He exhibits no swelling, no edema and no spasm.  Neurological: He is alert and oriented to person, place, and time. He displays no atrophy and no tremor. No sensory deficit. Gait normal.  Equal grip strength.  Weakness in hip flexion, L>R, ankle flexion and extension intact.  Skin: Skin is warm and dry.  Psychiatric: He has a normal mood and affect.  Nursing note and vitals reviewed.    ED Treatments / Results  Labs (all labs ordered are listed, but only abnormal results are  displayed) Labs Reviewed - No data to display  EKG None  Radiology No results found.  Procedures Procedures (including critical care time)  Medications Ordered in ED Medications  HYDROmorphone (DILAUDID) injection 1 mg (has no administration in time range)  dexamethasone (DECADRON) injection 10 mg (has no administration in time range)  HYDROmorphone (DILAUDID) injection 1 mg (1 mg Intramuscular Given 01/14/18 2313)     Initial Impression / Assessment and Plan / ED Course  I have reviewed the triage vital signs and the nursing notes.  Pertinent labs & imaging results that were available during my care of the patient were reviewed by me and considered in my medical decision making (see chart for details).     Pt requested course of prednisone while awaiting the arrangement of the IVIG infusions as this has helped in the past.  Discussed with Dr. Wilford Corner with the neurohospitalist service who confirmed no contraindication with giving steroids.  Discussed his increasing weakness and falls and pt is frustrated with the wait of getting home health for infusions arranged.  It would not be unreasonable to admit to get his IVIG started as home health is being set up.  Offered this to the patient but he prefers to not be admitted.    Given decadron here and will give burst dosing over the next 4 days.  Advised f/u with his neurology office as needed.   The patient appears reasonably screened and/or stabilized for discharge and I doubt any other medical condition or other [facility] requiring further screening, evaluation, or treatment in the ED at this time prior to discharge.   Final Clinical Impressions(s) / ED Diagnoses   Final diagnoses:  Pain in both lower extremities    ED Discharge Orders         Ordered    HYDROmorphone (DILAUDID) 2 MG tablet  Every 4 hours PRN     01/15/18 0004    predniSONE (DELTASONE) 20 MG tablet  Daily     01/15/18 0004           Burgess Amor, PA-C 01/15/18  0055    Terrilee Files, MD 01/15/18 1224

## 2018-01-15 ENCOUNTER — Telehealth: Payer: Self-pay | Admitting: *Deleted

## 2018-01-15 MED ORDER — HYDROMORPHONE HCL 2 MG PO TABS: 2.0000 mg | ORAL_TABLET | ORAL | 0 refills | Status: DC | PRN

## 2018-01-15 MED ORDER — PREDNISONE 20 MG PO TABS: 40.0000 mg | ORAL_TABLET | Freq: Every day | ORAL | 0 refills | Status: AC

## 2018-01-15 NOTE — Discharge Instructions (Addendum)
Take your next dose of prednisone tomorrow evening.  You may take the dilaudid prescribed for pain relief.  This will make you drowsy - do not drive within 4 hours of taking this medication.

## 2018-01-15 NOTE — Telephone Encounter (Signed)
[facility] request for another ICD 10 code for pts labs.  Added G61.81 CIDP.  Fax confirmation received. [phone].

## 2018-02-04 ENCOUNTER — Telehealth: Payer: Self-pay | Admitting: *Deleted

## 2018-02-04 NOTE — Telephone Encounter (Signed)
Received call from Tiffany with [facility]. She reported the patient received his first infusion of IVIG over 4 days-  on 10/8 thru 10/11. She stated he reported that he could tell improvement in his weakness and unsteadiness. This RN acknowledged and thanked her for the call.

## 2018-03-10 ENCOUNTER — Telehealth: Payer: Self-pay | Admitting: Diagnostic Neuroimaging

## 2018-03-10 NOTE — Telephone Encounter (Signed)
Called [name] and advised her Dr Marjory LiesPenumalli wants to see [name] 40-6 months for FU.  She asked if he could be seen in Dec due to new insurance/deductibke in Jan. This RN advised there is no availability and Dr Marjory LiesPenumalli advised a 4-6 month FU, unless the patient begins to have problems, new symptoms, worsening symptoms.  Scheduled for first available, April and put him on wait list. She stated she would call back tomorrow if that FU wasn't good with patient. [name] verbalized understanding, appreciation.

## 2018-03-10 NOTE — Telephone Encounter (Signed)
Pam, patient's girlfriend ([place] calling to discuss when patient is supposed to come back to see Dr. Marjory LiesPenumalli.

## 2018-03-10 NOTE — Telephone Encounter (Signed)
Every 4-6 months. _VRP

## 2018-03-24 ENCOUNTER — Telehealth: Payer: Self-pay | Admitting: *Deleted

## 2018-03-24 NOTE — Telephone Encounter (Signed)
[facility]; placed on Dr [facility] desk for review. Per Dr Marjory LiesPenumalli, called patient to inquire how he is doing. He stated he has an appt with PCP, Dr Margo AyeHall tomorrow. He stated his vision is the same, no changes. This RN advised that Dr Marjory LiesPenumalli didn't feel it was related to CIDP; patient has received several doses of IVIG therapy. Advised him if after Dr Margo AyeHall examines him he feels Dr Marjory LiesPenumalli needs to see him, to let us know. We will work him in to be seen. Patient verbalized understanding, appreciation of call.

## 2018-04-02 ENCOUNTER — Telehealth: Payer: Self-pay | Admitting: *Deleted

## 2018-04-02 NOTE — Telephone Encounter (Signed)
Received fax from [facility] re: IVIG infusions on 02/20/18, 02/21/18: patient reported no side effects or adverse reactions , no access or infusion issues to be noted, patient tolerated infusion well.  Infusion on 03/12/18: patient reported headache, no access or infusion issues to be noted, patient ambulating without cane although still weak and unsteady, steady pain in legs at night during sleep is alleviated now, decreased amount of pain meds. Infusion on 03/13/18: patient reported no side effects or adverse reactions, no access or infusion issues ot be noted, patient tolerated infusion well.  Reports on Dr [facility]'s desk for review.

## 2018-04-28 NOTE — Telephone Encounter (Signed)
Spoke to [employer], fiance.  Pt still with eye problem, seeing double vision, eye can move up and down, but not sideways. Is wearing patch.  Has been to pcp, changed insulin for change in A1c, but did not really address eye problem.  They have not seen opthamologist.  IVIG has helped with pain better, no falls, legs stronger.  Last infusion was last Thursday/Friday.  Eye problem started right before thanksgiving (driving to destination).  If needs opthamologist, can we refer? Please advise.

## 2018-04-28 NOTE — Telephone Encounter (Signed)
Continue IVIG tx. No other tx advised. -VRP

## 2018-04-28 NOTE — Telephone Encounter (Signed)
Pts girlfriend Pam requesting a call stating the pts eye has not gotten any better, would like to discuss Dr. Marjory Lies referring him elsewhere or to discuss the next option. Please advise

## 2018-04-28 NOTE — Telephone Encounter (Signed)
Relayed to Pam, fiance, that per Dr. Marjory Lies to continue IVIG, no other trxt, if pcp (who they see on Friday), will address and refer to opthamologist if this think needs it.  They would address it then.  She verbalized understanding.

## 2018-05-21 NOTE — Telephone Encounter (Signed)
Received [facility] and POC. Summary:  Pt seen by skilled nurse for admin of Panzyga 100 grams total IV dose given over 2 days every 3weeks via peripheral intravenous infusion.  Pt has tolerated infusions well.  Pt reports legs seem to be getting better, ambulating is stronger and no longer having night pain in legs.  Fax confirmation received to [phone]

## 2018-06-05 ENCOUNTER — Telehealth: Payer: Self-pay | Admitting: *Deleted

## 2018-06-05 NOTE — Telephone Encounter (Signed)
Received fax from [facility], patient received Panzyga 50 gms on 05/14/18, 05/15/18. Patient reported no side effects or adverse reactions, no access or infusion issues to be noted; patient tolerated the infusions well.

## 2018-06-23 ENCOUNTER — Telehealth: Payer: Self-pay | Admitting: *Deleted

## 2018-06-23 NOTE — Telephone Encounter (Signed)
Received fax from [facility], patient's insurance stating they are no longer covering services 06/19/18 - 09/17/2018. Ref # [id number]. Called [facility] to discuss, LVM for Consuella Lose with [employer]. requesting she call back. Patient has been receiving Panzyga infusions through [facility] since last year.

## 2018-06-24 NOTE — Telephone Encounter (Signed)
Called patient to discuss. He stated that he recently received EOB for the 2 IVIG  infusions he has received this year. He stated that is how he found out [facility] is now out of [employer]. He stated his bill is $34,500. He was due for infusion this week, but he called [facility] and cancelled because of cost.  He called [employer] to discuss cost and his inability to pay and was told to fax information to [facility]; he has done so. He stated the infusions have made him "a lot better, and his legs stronger".  He does not wish to pursue infusions through another company, stated he has to get  This straightened out first with [employer]. [employer] has told  him he needs to get providers through [place] or [facility] in [place]. He has to change PCP and stated he is sure Dr Marjory Lies is out of his network now as well.  He stated he will call [employer] again to FU on fax he sent. I advised he ask [employer] for specific infusion groups and neurologists in their network. He has FU in this office 08/16/18, and he will leave it but call back after he gets answers from [facility]. He verbalized understanding, appreciation of call.

## 2018-06-27 ENCOUNTER — Telehealth: Payer: Self-pay | Admitting: *Deleted

## 2018-06-27 NOTE — Telephone Encounter (Signed)
Received fax from [facility]: patient received Panzyga 50 gms infusions on 06/04/2018, 06/05/2018. Patient tolerated infusions well, no reported side effects or adverse reactions, no access or infusions issues noted.

## 2018-07-23 ENCOUNTER — Telehealth: Payer: Self-pay | Admitting: Diagnostic Neuroimaging

## 2018-07-23 NOTE — Telephone Encounter (Signed)
Received fax from [facility] stating the charges denied for the following -  services: medical- pharmacy, facility: [facility], dates of service: 07/17/18 - 07/17/2019, ref #: [id number] Called patient and LVM requesting call back to discuss.

## 2018-07-23 NOTE — Telephone Encounter (Signed)
Pt called stating that due to his insurance change we are no longer in network and he will be canceling his appt. Pt would like a call back if there is anything else that he needs to be informed of. Please advise.

## 2018-07-23 NOTE — Telephone Encounter (Signed)
Called patient and asked if he had gotten VM earlier today re:  Received fax from [facility] stating the charges denied for the following -  services: medical- pharmacy, facility: [facility], dates of service: 07/17/18 - 07/17/2019, ref #: [id number]. He had not listened. I asked if he had new neurologist; he stated he hasn't because of Covid 19. He has not had IVIG infusions either, stated he doesn't want to at this time. He has been in touch with [employer] and [facility], is waiting on them to call him back. I advised he let us know if he needs anything, will hold onto fax received today. Patient verbalized understanding, appreciation.

## 2018-08-13 ENCOUNTER — Ambulatory Visit: Payer: Self-pay | Admitting: Diagnostic Neuroimaging

## 2018-08-13 ENCOUNTER — Encounter

## 2020-12-07 DIAGNOSIS — Z789 Other specified health status: Secondary | ICD-10-CM | POA: Insufficient documentation

## 2020-12-07 DIAGNOSIS — J449 Chronic obstructive pulmonary disease, unspecified: Secondary | ICD-10-CM | POA: Insufficient documentation

## 2020-12-07 DIAGNOSIS — F32A Depression, unspecified: Secondary | ICD-10-CM | POA: Insufficient documentation

## 2020-12-07 DIAGNOSIS — G629 Polyneuropathy, unspecified: Secondary | ICD-10-CM | POA: Insufficient documentation

## 2020-12-07 DIAGNOSIS — G6181 Chronic inflammatory demyelinating polyneuritis: Secondary | ICD-10-CM | POA: Insufficient documentation

## 2021-03-21 DIAGNOSIS — G4730 Sleep apnea, unspecified: Secondary | ICD-10-CM | POA: Insufficient documentation

## 2021-03-21 DIAGNOSIS — Z9181 History of falling: Secondary | ICD-10-CM | POA: Insufficient documentation

## 2021-03-21 DIAGNOSIS — Z8719 Personal history of other diseases of the digestive system: Secondary | ICD-10-CM | POA: Insufficient documentation

## 2021-04-21 DIAGNOSIS — I272 Pulmonary hypertension, unspecified: Secondary | ICD-10-CM | POA: Insufficient documentation

## 2021-04-24 DIAGNOSIS — D649 Anemia, unspecified: Secondary | ICD-10-CM | POA: Insufficient documentation

## 2021-06-07 DIAGNOSIS — E8809 Other disorders of plasma-protein metabolism, not elsewhere classified: Secondary | ICD-10-CM | POA: Insufficient documentation

## 2021-08-04 DIAGNOSIS — K7460 Unspecified cirrhosis of liver: Secondary | ICD-10-CM | POA: Insufficient documentation

## 2022-03-02 DIAGNOSIS — E1144 Type 2 diabetes mellitus with diabetic amyotrophy: Secondary | ICD-10-CM | POA: Insufficient documentation

## 2022-08-23 ENCOUNTER — Other Ambulatory Visit: Payer: Self-pay

## 2022-08-23 ENCOUNTER — Emergency Department (HOSPITAL_COMMUNITY): Payer: BLUE CROSS/BLUE SHIELD

## 2022-08-23 ENCOUNTER — Emergency Department (HOSPITAL_COMMUNITY)
Admission: EM | Admit: 2022-08-23 | Discharge: 2022-08-24 | Disposition: A | Discharge status: Home / Self Care | Payer: BLUE CROSS/BLUE SHIELD | Attending: Emergency Medicine | Admitting: Emergency Medicine

## 2022-08-23 ENCOUNTER — Encounter (HOSPITAL_COMMUNITY): Payer: Self-pay | Admitting: Emergency Medicine

## 2022-08-23 DIAGNOSIS — R55 Syncope and collapse: Secondary | ICD-10-CM | POA: Diagnosis not present

## 2022-08-23 DIAGNOSIS — N179 Acute kidney failure, unspecified: Secondary | ICD-10-CM | POA: Insufficient documentation

## 2022-08-23 DIAGNOSIS — Z794 Long term (current) use of insulin: Secondary | ICD-10-CM | POA: Insufficient documentation

## 2022-08-23 DIAGNOSIS — W01198A Fall on same level from slipping, tripping and stumbling with subsequent striking against other object, initial encounter: Secondary | ICD-10-CM | POA: Insufficient documentation

## 2022-08-23 DIAGNOSIS — Y92007 Garden or yard of unspecified non-institutional (private) residence as the place of occurrence of the external cause: Secondary | ICD-10-CM | POA: Diagnosis not present

## 2022-08-23 DIAGNOSIS — S0003XA Contusion of scalp, initial encounter: Secondary | ICD-10-CM | POA: Insufficient documentation

## 2022-08-23 DIAGNOSIS — E119 Type 2 diabetes mellitus without complications: Secondary | ICD-10-CM | POA: Insufficient documentation

## 2022-08-23 DIAGNOSIS — I10 Essential (primary) hypertension: Secondary | ICD-10-CM | POA: Diagnosis not present

## 2022-08-23 DIAGNOSIS — Z79899 Other long term (current) drug therapy: Secondary | ICD-10-CM | POA: Diagnosis not present

## 2022-08-23 DIAGNOSIS — Y906 Blood alcohol level of 120-199 mg/100 ml: Secondary | ICD-10-CM | POA: Diagnosis not present

## 2022-08-23 DIAGNOSIS — D649 Anemia, unspecified: Secondary | ICD-10-CM | POA: Insufficient documentation

## 2022-08-23 DIAGNOSIS — S0990XA Unspecified injury of head, initial encounter: Secondary | ICD-10-CM

## 2022-08-23 DIAGNOSIS — Y93H2 Activity, gardening and landscaping: Secondary | ICD-10-CM | POA: Diagnosis not present

## 2022-08-23 DIAGNOSIS — E860 Dehydration: Secondary | ICD-10-CM | POA: Insufficient documentation

## 2022-08-23 LAB — COMPREHENSIVE METABOLIC PANEL
ALT: 14 U/L (ref 0–44)
AST: 22 U/L (ref 15–41)
Albumin: 1.7 g/dL — ABNORMAL LOW (ref 3.5–5.0)
Alkaline Phosphatase: 167 U/L — ABNORMAL HIGH (ref 38–126)
Anion gap: 9 (ref 5–15)
BUN: 18 mg/dL (ref 6–20)
CO2: 25 mmol/L (ref 22–32)
Calcium: 7.6 mg/dL — ABNORMAL LOW (ref 8.9–10.3)
Chloride: 98 mmol/L (ref 98–111)
Creatinine, Ser: 1.32 mg/dL — ABNORMAL HIGH (ref 0.61–1.24)
GFR, Estimated: >60 mL/min (ref >60)
Glucose, Bld: 269 mg/dL — ABNORMAL HIGH (ref 70–99)
Potassium: 3.8 mmol/L (ref 3.5–5.1)
Sodium: 132 mmol/L — ABNORMAL LOW (ref 135–145)
Total Bilirubin: 0.4 mg/dL (ref 0.3–1.2)
Total Protein: 6.6 g/dL (ref 6.5–8.1)

## 2022-08-23 LAB — CBC WITH DIFFERENTIAL/PLATELET
Abs Immature Granulocytes: 0.02 10*3/uL (ref 0.00–0.07)
Basophils Absolute: 0 10*3/uL (ref 0.0–0.1)
Basophils Relative: 1 %
Eosinophils Absolute: 0.1 10*3/uL (ref 0.0–0.5)
Eosinophils Relative: 1 %
HCT: 27.6 % — ABNORMAL LOW (ref 39.0–52.0)
Hemoglobin: 7.8 g/dL — ABNORMAL LOW (ref 13.0–17.0)
Immature Granulocytes: 0 %
Lymphocytes Relative: 31 %
Lymphs Abs: 2.3 10*3/uL (ref 0.7–4.0)
MCH: 23.5 pg — ABNORMAL LOW (ref 26.0–34.0)
MCHC: 28.3 g/dL — ABNORMAL LOW (ref 30.0–36.0)
MCV: 83.1 fL (ref 80.0–100.0)
Monocytes Absolute: 0.8 10*3/uL (ref 0.1–1.0)
Monocytes Relative: 11 %
Neutro Abs: 4.3 10*3/uL (ref 1.7–7.7)
Neutrophils Relative %: 56 %
Platelets: 205 10*3/uL (ref 150–400)
RBC: 3.32 MIL/uL — ABNORMAL LOW (ref 4.22–5.81)
RDW: 19.6 % — ABNORMAL HIGH (ref 11.5–15.5)
WBC: 7.5 10*3/uL (ref 4.0–10.5)
nRBC: 0 % (ref 0.0–0.2)

## 2022-08-23 LAB — TROPONIN I (HIGH SENSITIVITY): Troponin I (High Sensitivity): 7 ng/L (ref <18)

## 2022-08-23 LAB — ETHANOL: Alcohol, Ethyl (B): 155 mg/dL — ABNORMAL HIGH (ref <10)

## 2022-08-23 MED ORDER — SODIUM CHLORIDE 0.9 % IV BOLUS
1000.0000 mL | Freq: Once | INTRAVENOUS | Status: AC
  Administered 2022-08-23: 1000 mL via INTRAVENOUS

## 2022-08-23 MED ORDER — SODIUM CHLORIDE 0.9 % IV BOLUS
500.0000 mL | Freq: Once | INTRAVENOUS | Status: AC
  Administered 2022-08-23: 500 mL via INTRAVENOUS

## 2022-08-23 NOTE — ED Notes (Signed)
XR and phleb at bedside

## 2022-08-23 NOTE — ED Notes (Signed)
Gave snack  

## 2022-08-23 NOTE — ED Triage Notes (Signed)
Pt passed out after mowing the yard. Pt states he took 4 shot of alcohol.

## 2022-08-23 NOTE — ED Provider Notes (Signed)
[place] [facility] AT [facility] Provider Note   CSN: [id number] Arrival date & time: 08/23/22  2014     History  Chief Complaint  Patient presents with   Loss of Consciousness    [name] is a 57 y.o. male with past medical history of diabetes, A-fib, hyperlipidemia, hypertension who presents to the ED complaining of syncopal episode.  Patient reports that he was mowing the lawn previous episode and he got up and felt unsteady on his feet, his legs gave out, and then passed out hitting his head on concrete.  Patient states that at baseline he notes weakness of both of his legs and this is unchanged from his norm.  States the weakness is secondary to peripheral neuropathy from his diabetes.  He denies preceding chest pain, shortness of breath, vision changes, or other symptoms.  He is not anticoagulated.  He denies a history of recurrent syncope.  Wife reports that it was difficult to arouse patient following him hitting his head so she called EMS who brought patient to the hospital for further evaluation.  Patient denies any current symptoms including headache, vision changes, focal weakness, nausea, vomiting.  He denies recent illness including fever, cough congestion, or urinary symptoms.      Home Medications Prior to Admission medications   Medication Sig Start Date End Date Taking? Authorizing Provider  acetaminophen (TYLENOL) 500 MG tablet Take 1,000 mg by mouth 2 (two) times daily.     [provider]  allopurinol (ZYLOPRIM) 300 MG tablet Take 300 mg by mouth daily.  01/21/11   [provider]  ALPRAZolam Prudy Feeler) 0.5 MG tablet Take 1 tablet (0.5 mg total) by mouth as needed for anxiety (for sedation before MRI scan; take 1 hour before scan; may repeat 15 min before scan). 12/26/17   Penumalli, Glenford Bayley, MD  amLODipine (NORVASC) 5 MG tablet Take 5 mg by mouth at bedtime. 10/06/17   [provider]  atenolol (TENORMIN) 100 MG tablet  Take 100 mg by mouth daily.    [provider]  Cholecalciferol (VITAMIN D3) 5000 units CAPS Take 2 capsules by mouth daily.    [provider]  colchicine 0.6 MG tablet Take 0.6 mg by mouth at bedtime.    [provider]  folic acid (FOLVITE) 1 MG tablet Take 1 tablet (1 mg total) by mouth daily. Patient not taking: Reported on 10/17/2017 08/29/17   Glade Lloyd, MD  gabapentin (NEURONTIN) 300 MG capsule Take 1 capsule by mouth at bedtime.  10/06/17   [provider]  HYDROmorphone (DILAUDID) 2 MG tablet Take 1 tablet (2 mg total) by mouth every 4 (four) hours as needed for severe pain. 01/15/18   Burgess Amor, PA-C  meloxicam (MOBIC) 15 MG tablet Take 15 mg by mouth daily.    [provider]  methocarbamol (ROBAXIN) 500 MG tablet Take 1 tablet (500 mg total) by mouth 2 (two) times daily. Patient not taking: Reported on 12/16/2017 11/22/17   Sabas Sous, MD  Multiple Minerals-Vitamins (CALCIUM-MAGNESIUM-ZINC-D3 PO) Take 1 tablet by mouth daily.    [provider]  naproxen (NAPROSYN) 375 MG tablet Take 1 tablet (375 mg total) by mouth 2 (two) times daily as needed. Patient not taking: Reported on 10/17/2017 09/29/17   Glynn Octave, MD  olmesartan (BENICAR) 40 MG tablet 40 mg daily. 12/15/17   [provider]  omeprazole (PRILOSEC) 20 MG capsule Take 20 mg by mouth daily.    [provider]  oxyCODONE-acetaminophen (PERCOCET/ROXICET) 5-325 MG tablet Take 1 tablet by mouth every 4 (four) hours as needed. 10/17/17   Burgess Amor, PA-C  polyethylene glycol (MIRALAX) packet Take 17 g by mouth daily as needed for moderate constipation. 08/28/17   Glade Lloyd, MD  pravastatin (PRAVACHOL) 20 MG tablet Take 20 mg by mouth daily.    [provider]  thiamine 100 MG tablet Take 1 tablet (100 mg total) by mouth daily. Patient not taking: Reported on 10/17/2017 08/29/17   Glade Lloyd, MD  TOUJEO MAX SOLOSTAR 300 UNIT/ML SOPN Inject  130 Units into the skin daily.  08/18/17   [provider]      Allergies    Hydrocodone    Review of Systems   Review of Systems  All other systems reviewed and are negative.   Physical Exam Updated Vital Signs BP (!) 152/90   Pulse 97   Temp 98.3 F (36.8 C) (Oral)   Resp (!) 21   Ht 5\' 11"  (1.803 m)   Wt 108.9 kg   SpO2 97%   BMI 33.47 kg/m  Physical Exam Vitals and nursing note reviewed.  Constitutional:      General: He is not in acute distress.    Appearance: Normal appearance. He is not ill-appearing or toxic-appearing.  HENT:     Head: Normocephalic.     Comments: Small hematoma to the occipital scalp, no active bleeding or other wounds noted    Right Ear: External ear normal.     Left Ear: External ear normal.     Mouth/Throat:     Mouth: Mucous membranes are dry.  Eyes:     General: No scleral icterus.    Extraocular Movements: Extraocular movements intact.     Conjunctiva/sclera: Conjunctivae normal.     Pupils: Pupils are equal, round, and reactive to light.  Cardiovascular:     Rate and Rhythm: Normal rate and regular rhythm.     Heart sounds: No murmur heard. Pulmonary:     Effort: Pulmonary effort is normal. No respiratory distress.     Breath sounds: Normal breath sounds. No stridor. No wheezing, rhonchi or rales.  Abdominal:     General: Abdomen is flat. There is no distension.     Palpations: Abdomen is soft.     Tenderness: There is no abdominal tenderness. There is no right CVA tenderness, left CVA tenderness, guarding or rebound.  Musculoskeletal:        General: Normal range of motion.     Cervical back: Normal range of motion and neck supple. No rigidity or tenderness.     Right lower leg: No edema.     Left lower leg: No edema.     Comments: No midline CTL spinal tenderness, step-offs, or deformities  Skin:    General: Skin is warm and dry.     Capillary Refill: Capillary refill takes less than 2 seconds.     Coloration: Skin  is not jaundiced or pale.     Findings: No rash.  Neurological:     General: No focal deficit present.     Mental Status: He is alert and oriented to person, place, and time.     GCS: GCS eye subscore is 4. GCS verbal subscore is 5. GCS motor subscore is 6.     Cranial Nerves: Cranial nerves 2-12 are intact. No cranial nerve deficit, dysarthria or facial asymmetry.     Motor: Motor function is intact. No weakness, tremor, atrophy or  seizure activity.     Coordination: Coordination is intact.  Psychiatric:        Behavior: Behavior normal.     ED Results / Procedures / Treatments   Labs (all labs ordered are listed, but only abnormal results are displayed) Labs Reviewed  CBC WITH DIFFERENTIAL/PLATELET - Abnormal; Notable for the following components:      Result Value   RBC 3.32 (*)    Hemoglobin 7.8 (*)    HCT 27.6 (*)    MCH 23.5 (*)    MCHC 28.3 (*)    RDW 19.6 (*)    All other components within normal limits  COMPREHENSIVE METABOLIC PANEL - Abnormal; Notable for the following components:   Sodium 132 (*)    Glucose, Bld 269 (*)    Creatinine, Ser 1.32 (*)    Calcium 7.6 (*)    Albumin 1.7 (*)    Alkaline Phosphatase 167 (*)    All other components within normal limits  ETHANOL - Abnormal; Notable for the following components:   Alcohol, Ethyl (B) 155 (*)    All other components within normal limits  CBG MONITORING, ED  TROPONIN I (HIGH SENSITIVITY)    EKG Normal sinus rhythm, no STEMI  Radiology CT Head Wo Contrast  Result Date: 08/23/2022 CLINICAL DATA:  Polytrauma, blunt; Head trauma, moderate-severe EXAM: CT HEAD WITHOUT CONTRAST CT CERVICAL SPINE WITHOUT CONTRAST TECHNIQUE: Multidetector CT imaging of the head and cervical spine was performed following the standard protocol without intravenous contrast. Multiplanar CT image reconstructions of the cervical spine were also generated. RADIATION DOSE REDUCTION: This exam was performed according to the departmental  dose-optimization program which includes automated exposure control, adjustment of the mA and/or kV according to patient size and/or use of iterative reconstruction technique. COMPARISON:  None Available. FINDINGS: CT HEAD FINDINGS Brain: Cerebral ventricle sizes are concordant with the degree of cerebral volume loss. Patchy and confluent areas of decreased attenuation are noted throughout the deep and periventricular white matter of the cerebral hemispheres bilaterally, compatible with chronic microvascular ischemic disease. No evidence of large-territorial acute infarction. No parenchymal hemorrhage. No mass lesion. No extra-axial collection. No mass effect or midline shift. No hydrocephalus. Basilar cisterns are patent. Vascular: No hyperdense vessel. Atherosclerotic calcifications are present within the cavernous internal carotid arteries. Skull: No acute fracture or focal lesion. Sinuses/Orbits:Right sphenoid sinus mucosal thickening. Right mastoidectomy. Paranasal sinuses and mastoid air cells are clear. The orbits are unremarkable. Other: 1.1 cm subcutaneus soft tissue hematoma formation along the right occipital scalp. CT CERVICAL SPINE FINDINGS Alignment: Normal. Skull base and vertebrae: Multilevel moderate degenerative changes of the spine. Associated severe right C2-C3 osseous neural foraminal stenosis. No severe osseous central canal stenosis. No acute fracture. No aggressive appearing focal osseous lesion or focal pathologic process. Soft tissues and spinal canal: No prevertebral fluid or swelling. No visible canal hematoma. Upper chest: Unremarkable. Other: Atherosclerotic plaque of the carotid arteries within the neck. IMPRESSION: 1. No acute intracranial abnormality. 2. No acute displaced fracture or traumatic listhesis of the cervical spine. 3. A 1.1 cm subcutaneus soft tissue hematoma formation along the right occipital scalp. 4. Multilevel moderate degenerative changes of the spine. Associated  severe right C2-C3 osseous neural foraminal stenosis. Electronically Signed   By: Tish Frederickson M.D.   On: 08/23/2022 22:28   CT Cervical Spine Wo Contrast  Result Date: 08/23/2022 CLINICAL DATA:  Polytrauma, blunt; Head trauma, moderate-severe EXAM: CT HEAD WITHOUT CONTRAST CT CERVICAL SPINE WITHOUT CONTRAST TECHNIQUE: Multidetector CT imaging of  the head and cervical spine was performed following the standard protocol without intravenous contrast. Multiplanar CT image reconstructions of the cervical spine were also generated. RADIATION DOSE REDUCTION: This exam was performed according to the departmental dose-optimization program which includes automated exposure control, adjustment of the mA and/or kV according to patient size and/or use of iterative reconstruction technique. COMPARISON:  None Available. FINDINGS: CT HEAD FINDINGS Brain: Cerebral ventricle sizes are concordant with the degree of cerebral volume loss. Patchy and confluent areas of decreased attenuation are noted throughout the deep and periventricular white matter of the cerebral hemispheres bilaterally, compatible with chronic microvascular ischemic disease. No evidence of large-territorial acute infarction. No parenchymal hemorrhage. No mass lesion. No extra-axial collection. No mass effect or midline shift. No hydrocephalus. Basilar cisterns are patent. Vascular: No hyperdense vessel. Atherosclerotic calcifications are present within the cavernous internal carotid arteries. Skull: No acute fracture or focal lesion. Sinuses/Orbits:Right sphenoid sinus mucosal thickening. Right mastoidectomy. Paranasal sinuses and mastoid air cells are clear. The orbits are unremarkable. Other: 1.1 cm subcutaneus soft tissue hematoma formation along the right occipital scalp. CT CERVICAL SPINE FINDINGS Alignment: Normal. Skull base and vertebrae: Multilevel moderate degenerative changes of the spine. Associated severe right C2-C3 osseous neural foraminal  stenosis. No severe osseous central canal stenosis. No acute fracture. No aggressive appearing focal osseous lesion or focal pathologic process. Soft tissues and spinal canal: No prevertebral fluid or swelling. No visible canal hematoma. Upper chest: Unremarkable. Other: Atherosclerotic plaque of the carotid arteries within the neck. IMPRESSION: 1. No acute intracranial abnormality. 2. No acute displaced fracture or traumatic listhesis of the cervical spine. 3. A 1.1 cm subcutaneus soft tissue hematoma formation along the right occipital scalp. 4. Multilevel moderate degenerative changes of the spine. Associated severe right C2-C3 osseous neural foraminal stenosis. Electronically Signed   By: Tish Frederickson M.D.   On: 08/23/2022 22:28   DG Chest Port 1 View  Result Date: 08/23/2022 CLINICAL DATA:  Syncope EXAM: PORTABLE CHEST 1 VIEW COMPARISON:  01/12/2011 FINDINGS: The heart size and mediastinal contours are within normal limits. Both lungs are clear. The visualized skeletal structures are unremarkable. IMPRESSION: No active disease. Electronically Signed   By: Helyn Numbers M.D.   On: 08/23/2022 20:59    Procedures Procedures    Medications Ordered in ED Medications  sodium chloride 0.9 % bolus 1,000 mL (0 mLs Intravenous Stopped 08/23/22 2252)  sodium chloride 0.9 % bolus 500 mL (500 mLs Intravenous New Bag/Given 08/23/22 2252)    ED Course/ Medical Decision Making/ A&P                             Medical Decision Making Amount and/or Complexity of Data Reviewed Labs: ordered. Decision-making details documented in ED Course. Radiology: ordered. Decision-making details documented in ED Course. ECG/medicine tests: ordered. Decision-making details documented in ED Course.   Medical Decision Making:   [name] is a 57 y.o. male who presented to the ED today with head injury detailed above.    Additional history discussed with patient's family/caregivers.  Patient's presentation is  complicated by their history of multiple comorbidities.  Patient placed on continuous vitals and telemetry monitoring while in ED which was reviewed periodically.  Complete initial physical exam performed, notably the patient was in no acute distress.  Neurologically intact.  Abdomen soft and nontender.  Minor hematoma to the occipital scalp but no other signs of head injury. EOMI. PERRL. No midline spinal  tenderness.    Reviewed and confirmed nursing documentation for past medical history, family history, social history.    Initial Assessment:   With the patient's presentation, differential diagnosis includes but is not limited to: drug overdose - opioids, alcohol, sedatives, antipsychotics, drug withdrawal, others; Metabolic: hypoxia, hypoglycemia, hyperglycemia, hypercalcemia, hypernatremia, hyponatremia, uremia, hepatic encephalopathy, hypothyroidism, hyperthyroidism, vitamin B12 or thiamine deficiency, carbon monoxide poisoning, Wilson's disease, Lactic acidosis, DKA/HHOS; Infectious: meningitis, encephalitis, bacteremia/sepsis, urinary tract infection, pneumonia, neurosyphilis; Structural: Space-occupying lesion, (brain tumor, subdural hematoma, hydrocephalus,); Vascular: stroke, subarachnoid hemorrhage, coronary ischemia, hypertensive encephalopathy, CNS vasculitis, thrombotic thrombocytopenic purpura, disseminated intravascular coagulation, hyperviscosity; Psychiatric: Schizophrenia, depression; Other: Seizure, hypothermia, heat stroke, dementia ; Trauma ICH/SAH, fracture, disk herniation.  This is most consistent with an acute complicated illness  Initial Plan:  Screening labs including CBC and Metabolic panel to evaluate for infectious or metabolic etiology of disease.  Urinalysis with reflex culture ordered to evaluate for UTI or relevant urologic/nephrologic pathology.  CXR to evaluate for structural/infectious intrathoracic pathology.  EKG and troponin to evaluate for cardiac  pathology Ethanol to assess alcohol level CT brain and neck to assess for traumatic injuries Objective evaluation as below reviewed   Initial Study Results:   Laboratory  All laboratory results reviewed without evidence of clinically relevant pathology.   Exceptions include: Sodium 132, glucose 269, creatinine 1.32, calcium 7.6, alk phos 167, albumin 1.7, hemoglobin 7.8, alcohol 155  EKG EKG was reviewed independently.  Normal sinus rhythm.  No STEMI.  Radiology:  All images reviewed independently. Agree with radiology report at this time.   CT Head Wo Contrast  Result Date: 08/23/2022 CLINICAL DATA:  Polytrauma, blunt; Head trauma, moderate-severe EXAM: CT HEAD WITHOUT CONTRAST CT CERVICAL SPINE WITHOUT CONTRAST TECHNIQUE: Multidetector CT imaging of the head and cervical spine was performed following the standard protocol without intravenous contrast. Multiplanar CT image reconstructions of the cervical spine were also generated. RADIATION DOSE REDUCTION: This exam was performed according to the departmental dose-optimization program which includes automated exposure control, adjustment of the mA and/or kV according to patient size and/or use of iterative reconstruction technique. COMPARISON:  None Available. FINDINGS: CT HEAD FINDINGS Brain: Cerebral ventricle sizes are concordant with the degree of cerebral volume loss. Patchy and confluent areas of decreased attenuation are noted throughout the deep and periventricular white matter of the cerebral hemispheres bilaterally, compatible with chronic microvascular ischemic disease. No evidence of large-territorial acute infarction. No parenchymal hemorrhage. No mass lesion. No extra-axial collection. No mass effect or midline shift. No hydrocephalus. Basilar cisterns are patent. Vascular: No hyperdense vessel. Atherosclerotic calcifications are present within the cavernous internal carotid arteries. Skull: No acute fracture or focal lesion.  Sinuses/Orbits:Right sphenoid sinus mucosal thickening. Right mastoidectomy. Paranasal sinuses and mastoid air cells are clear. The orbits are unremarkable. Other: 1.1 cm subcutaneus soft tissue hematoma formation along the right occipital scalp. CT CERVICAL SPINE FINDINGS Alignment: Normal. Skull base and vertebrae: Multilevel moderate degenerative changes of the spine. Associated severe right C2-C3 osseous neural foraminal stenosis. No severe osseous central canal stenosis. No acute fracture. No aggressive appearing focal osseous lesion or focal pathologic process. Soft tissues and spinal canal: No prevertebral fluid or swelling. No visible canal hematoma. Upper chest: Unremarkable. Other: Atherosclerotic plaque of the carotid arteries within the neck. IMPRESSION: 1. No acute intracranial abnormality. 2. No acute displaced fracture or traumatic listhesis of the cervical spine. 3. A 1.1 cm subcutaneus soft tissue hematoma formation along the right occipital scalp. 4. Multilevel moderate degenerative changes of the spine.  Associated severe right C2-C3 osseous neural foraminal stenosis. Electronically Signed   By: Tish Frederickson M.D.   On: 08/23/2022 22:28   CT Cervical Spine Wo Contrast  Result Date: 08/23/2022 CLINICAL DATA:  Polytrauma, blunt; Head trauma, moderate-severe EXAM: CT HEAD WITHOUT CONTRAST CT CERVICAL SPINE WITHOUT CONTRAST TECHNIQUE: Multidetector CT imaging of the head and cervical spine was performed following the standard protocol without intravenous contrast. Multiplanar CT image reconstructions of the cervical spine were also generated. RADIATION DOSE REDUCTION: This exam was performed according to the departmental dose-optimization program which includes automated exposure control, adjustment of the mA and/or kV according to patient size and/or use of iterative reconstruction technique. COMPARISON:  None Available. FINDINGS: CT HEAD FINDINGS Brain: Cerebral ventricle sizes are concordant  with the degree of cerebral volume loss. Patchy and confluent areas of decreased attenuation are noted throughout the deep and periventricular white matter of the cerebral hemispheres bilaterally, compatible with chronic microvascular ischemic disease. No evidence of large-territorial acute infarction. No parenchymal hemorrhage. No mass lesion. No extra-axial collection. No mass effect or midline shift. No hydrocephalus. Basilar cisterns are patent. Vascular: No hyperdense vessel. Atherosclerotic calcifications are present within the cavernous internal carotid arteries. Skull: No acute fracture or focal lesion. Sinuses/Orbits:Right sphenoid sinus mucosal thickening. Right mastoidectomy. Paranasal sinuses and mastoid air cells are clear. The orbits are unremarkable. Other: 1.1 cm subcutaneus soft tissue hematoma formation along the right occipital scalp. CT CERVICAL SPINE FINDINGS Alignment: Normal. Skull base and vertebrae: Multilevel moderate degenerative changes of the spine. Associated severe right C2-C3 osseous neural foraminal stenosis. No severe osseous central canal stenosis. No acute fracture. No aggressive appearing focal osseous lesion or focal pathologic process. Soft tissues and spinal canal: No prevertebral fluid or swelling. No visible canal hematoma. Upper chest: Unremarkable. Other: Atherosclerotic plaque of the carotid arteries within the neck. IMPRESSION: 1. No acute intracranial abnormality. 2. No acute displaced fracture or traumatic listhesis of the cervical spine. 3. A 1.1 cm subcutaneus soft tissue hematoma formation along the right occipital scalp. 4. Multilevel moderate degenerative changes of the spine. Associated severe right C2-C3 osseous neural foraminal stenosis. Electronically Signed   By: Tish Frederickson M.D.   On: 08/23/2022 22:28   DG Chest Port 1 View  Result Date: 08/23/2022 CLINICAL DATA:  Syncope EXAM: PORTABLE CHEST 1 VIEW COMPARISON:  01/12/2011 FINDINGS: The heart size and  mediastinal contours are within normal limits. Both lungs are clear. The visualized skeletal structures are unremarkable. IMPRESSION: No active disease. Electronically Signed   By: Helyn Numbers M.D.   On: 08/23/2022 20:59      Final Assessment and Plan:   57 year old male presenting to the ED for head injury and loss of consciousness.  Reports that he was outside mowing the grass and drinking alcohol when he felt weak in his legs stumbled and lost consciousness.  Patient states that weakness in his legs is at baseline secondary to his peripheral neuropathy.  Workup initiated as above for further assessment.  Patient with elevated alcohol level.  Hemoglobin at patient's baseline per chart review.  Alk phos at baseline per chart review.  Creatinine elevated at 1.32.  Patient does appear clinically dehydrated.  In setting of this as well as alcohol consumption, suspect AKI secondary to dehydration and patient given 1.5 L IV fluids to replenish and tolerating p.o. without difficulty.  Small scalp hematoma but otherwise CT head and cervical spine without significant acute findings.  Patient is hypertensive but afebrile, maintaining oxygen saturation, and without signs  of acute distress.  Neurologically intact on exam.  No meningismus.  No recent infectious symptoms.  Suspect fall/syncope related to dehydration complicated by alcohol intoxication complicated by multiple comorbidities resulting in patient's injuries.  Discussed all findings with patient and wife at bedside.  Patient currently asymptomatic and stating that he is hungry.  Discussed plan to replete with fluids and patient is agreeable to this.  Patient would like to be discharged home which I do think is reasonable in the setting of overall reassuring labs.  Will have patient closely follow-up with primary care within the next week for recheck of hemoglobin and kidney function.  Patient agreeable to do so.  Strict ED return precautions given, all  questions answered, and stable for discharge.   Clinical Impression:  1. Syncope, unspecified syncope type   2. Injury of head, initial encounter   3. Hematoma of scalp, initial encounter   4. Acute kidney injury (HCC)   5. Anemia, unspecified type   6. Dehydration      Discharge           Final Clinical Impression(s) / ED Diagnoses Final diagnoses:  Syncope, unspecified syncope type  Injury of head, initial encounter  Hematoma of scalp, initial encounter  Acute kidney injury (HCC)  Anemia, unspecified type  Dehydration    Rx / DC Orders ED Discharge Orders     None         Richardson Dopp 08/23/22 2259    Bethann Berkshire, MD 08/25/22 1252

## 2022-08-23 NOTE — Discharge Instructions (Addendum)
Thank you for letting us take care of you today.  Overall, your workup was reassuring.  You do appear dehydrated and there is a slight increase in your kidney function.  We gave you fluids to help with this.  Be sure to stay well-hydrated at home to prevent any further worsening of your kidney function.  Your blood counts are low but this appears typical for you.  I recommend following up with your PCP within the next week for recheck of your blood counts and kidney function.  Your scans showed a small hematoma to the back of her head but no other significant findings in your head or neck.  Your chest x-ray and heart enzymes were normal as well.  Please continue to stay well-hydrated.  For any headache related to your injury and, you may take over-the-counter medication as needed.  Follow-up with your PCP as discussed.  For any new or worsening condition or recurrent head injuries, please return to the nearest emergency department for reevaluation.

## 2022-09-14 ENCOUNTER — Encounter (HOSPITAL_COMMUNITY): Payer: Self-pay | Admitting: Emergency Medicine

## 2022-09-14 ENCOUNTER — Other Ambulatory Visit: Payer: Self-pay

## 2022-09-14 ENCOUNTER — Emergency Department (HOSPITAL_COMMUNITY)
Admission: EM | Admit: 2022-09-14 | Discharge: 2022-09-14 | Disposition: A | Discharge status: Home / Self Care | Payer: BLUE CROSS/BLUE SHIELD | Attending: Emergency Medicine | Admitting: Emergency Medicine

## 2022-09-14 DIAGNOSIS — R739 Hyperglycemia, unspecified: Secondary | ICD-10-CM

## 2022-09-14 DIAGNOSIS — E119 Type 2 diabetes mellitus without complications: Secondary | ICD-10-CM | POA: Diagnosis not present

## 2022-09-14 DIAGNOSIS — Z794 Long term (current) use of insulin: Secondary | ICD-10-CM | POA: Insufficient documentation

## 2022-09-14 DIAGNOSIS — I10 Essential (primary) hypertension: Secondary | ICD-10-CM | POA: Insufficient documentation

## 2022-09-14 DIAGNOSIS — R531 Weakness: Secondary | ICD-10-CM | POA: Insufficient documentation

## 2022-09-14 DIAGNOSIS — Z79899 Other long term (current) drug therapy: Secondary | ICD-10-CM | POA: Diagnosis not present

## 2022-09-14 HISTORY — DX: Unspecified cirrhosis of liver: K74.60

## 2022-09-14 LAB — PROTIME-INR
INR: 1.4 — ABNORMAL HIGH (ref 0.8–1.2)
Prothrombin Time: 17.1 s — ABNORMAL HIGH (ref 11.4–15.2)

## 2022-09-14 LAB — CBC WITH DIFFERENTIAL/PLATELET
Abs Immature Granulocytes: 0.02 K/uL (ref 0.00–0.07)
Basophils Absolute: 0.1 K/uL (ref 0.0–0.1)
Basophils Relative: 1 %
Eosinophils Absolute: 0 K/uL (ref 0.0–0.5)
Eosinophils Relative: 0 %
HCT: 28.9 % — ABNORMAL LOW (ref 39.0–52.0)
Hemoglobin: 8.1 g/dL — ABNORMAL LOW (ref 13.0–17.0)
Immature Granulocytes: 0 %
Lymphocytes Relative: 28 %
Lymphs Abs: 2.1 K/uL (ref 0.7–4.0)
MCH: 23.8 pg — ABNORMAL LOW (ref 26.0–34.0)
MCHC: 28 g/dL — ABNORMAL LOW (ref 30.0–36.0)
MCV: 85 fL (ref 80.0–100.0)
Monocytes Absolute: 0.7 K/uL (ref 0.1–1.0)
Monocytes Relative: 9 %
Neutro Abs: 4.6 K/uL (ref 1.7–7.7)
Neutrophils Relative %: 62 %
Platelets: 213 K/uL (ref 150–400)
RBC: 3.4 MIL/uL — ABNORMAL LOW (ref 4.22–5.81)
RDW: 20.9 % — ABNORMAL HIGH (ref 11.5–15.5)
WBC: 7.6 K/uL (ref 4.0–10.5)
nRBC: 0 % (ref 0.0–0.2)

## 2022-09-14 LAB — URINALYSIS, ROUTINE W REFLEX MICROSCOPIC
Bacteria, UA: NONE SEEN
Bilirubin Urine: NEGATIVE
Glucose, UA: >=500 mg/dL — AB
Ketones, ur: NEGATIVE mg/dL
Leukocytes,Ua: NEGATIVE
Nitrite: NEGATIVE
Protein, ur: 30 mg/dL — AB
Specific Gravity, Urine: 1.021 (ref 1.005–1.030)
pH: 5 (ref 5.0–8.0)

## 2022-09-14 LAB — COMPREHENSIVE METABOLIC PANEL WITH GFR
ALT: 15 U/L (ref 0–44)
AST: 25 U/L (ref 15–41)
Albumin: 1.7 g/dL — ABNORMAL LOW (ref 3.5–5.0)
Alkaline Phosphatase: 188 U/L — ABNORMAL HIGH (ref 38–126)
Anion gap: 10 (ref 5–15)
BUN: 16 mg/dL (ref 6–20)
CO2: 21 mmol/L — ABNORMAL LOW (ref 22–32)
Calcium: 7.5 mg/dL — ABNORMAL LOW (ref 8.9–10.3)
Chloride: 101 mmol/L (ref 98–111)
Creatinine, Ser: 1.21 mg/dL (ref 0.61–1.24)
GFR, Estimated: >60 mL/min
Glucose, Bld: 462 mg/dL — ABNORMAL HIGH (ref 70–99)
Potassium: 4 mmol/L (ref 3.5–5.1)
Sodium: 132 mmol/L — ABNORMAL LOW (ref 135–145)
Total Bilirubin: 0.5 mg/dL (ref 0.3–1.2)
Total Protein: 7.3 g/dL (ref 6.5–8.1)

## 2022-09-14 LAB — TROPONIN I (HIGH SENSITIVITY)
Troponin I (High Sensitivity): 7 ng/L
Troponin I (High Sensitivity): 6 ng/L (ref <18)

## 2022-09-14 LAB — CBG MONITORING, ED: Glucose-Capillary: 252 mg/dL — ABNORMAL HIGH (ref 70–99)

## 2022-09-14 LAB — AMMONIA: Ammonia: 17 umol/L (ref 9–35)

## 2022-09-14 MED ORDER — INSULIN ASPART 100 UNIT/ML IV SOLN
10.0000 [IU] | Freq: Once | INTRAVENOUS | Status: AC
  Administered 2022-09-14: 10 [IU] via INTRAVENOUS

## 2022-09-14 MED ORDER — SODIUM CHLORIDE 0.9 % IV BOLUS
500.0000 mL | Freq: Once | INTRAVENOUS | Status: AC
  Administered 2022-09-14: 500 mL via INTRAVENOUS

## 2022-09-14 NOTE — ED Provider Notes (Addendum)
[place] EMERGENCY DEPARTMENT AT [facility] Provider Note   CSN: [id number] Arrival date & time: 09/14/22  0015     History  Chief Complaint  Patient presents with   Weakness    [name] is a 57 y.o. male.  Patient is a 57 year old male with past medical history of hypertension, cirrhosis, type 2 diabetes, GERD.  Patient presenting today with complaints of weakness.  He reports a several week history of feeling weak, having no energy, and feeling as if he needs to sleep more frequently.  He denies to me he is having any fevers or chills.  He was seen here 3 weeks ago after falling and striking his head.  He had a hemoglobin at that time of 7.8.  At 1 point approximately 6 months ago, he was given a blood transfusion due to a hemoglobin less than 7.  It was recommended that he have an endoscopy, however patient declined.  This was at [facility].  The history is provided by the patient.       Home Medications Prior to Admission medications   Medication Sig Start Date End Date Taking? Authorizing Provider  acetaminophen (TYLENOL) 500 MG tablet Take 1,000 mg by mouth 2 (two) times daily.     [provider]  allopurinol (ZYLOPRIM) 300 MG tablet Take 300 mg by mouth daily.  01/21/11   [provider]  ALPRAZolam Prudy Feeler) 0.5 MG tablet Take 1 tablet (0.5 mg total) by mouth as needed for anxiety (for sedation before MRI scan; take 1 hour before scan; may repeat 15 min before scan). 12/26/17   Penumalli, Glenford Bayley, MD  amLODipine (NORVASC) 5 MG tablet Take 5 mg by mouth at bedtime. 10/06/17   [provider]  atenolol (TENORMIN) 100 MG tablet Take 100 mg by mouth daily.    [provider]  Cholecalciferol (VITAMIN D3) 5000 units CAPS Take 2 capsules by mouth daily.    [provider]  colchicine 0.6 MG tablet Take 0.6 mg by mouth at bedtime.    [provider]  folic acid (FOLVITE) 1 MG tablet Take 1 tablet (1 mg total) by mouth  daily. Patient not taking: Reported on 10/17/2017 08/29/17   Glade Lloyd, MD  gabapentin (NEURONTIN) 300 MG capsule Take 1 capsule by mouth at bedtime.  10/06/17   [provider]  HYDROmorphone (DILAUDID) 2 MG tablet Take 1 tablet (2 mg total) by mouth every 4 (four) hours as needed for severe pain. 01/15/18   Burgess Amor, PA-C  meloxicam (MOBIC) 15 MG tablet Take 15 mg by mouth daily.    [provider]  methocarbamol (ROBAXIN) 500 MG tablet Take 1 tablet (500 mg total) by mouth 2 (two) times daily. Patient not taking: Reported on 12/16/2017 11/22/17   Sabas Sous, MD  Multiple Minerals-Vitamins (CALCIUM-MAGNESIUM-ZINC-D3 PO) Take 1 tablet by mouth daily.    [provider]  naproxen (NAPROSYN) 375 MG tablet Take 1 tablet (375 mg total) by mouth 2 (two) times daily as needed. Patient not taking: Reported on 10/17/2017 09/29/17   Glynn Octave, MD  olmesartan (BENICAR) 40 MG tablet 40 mg daily. 12/15/17   [provider]  omeprazole (PRILOSEC) 20 MG capsule Take 20 mg by mouth daily.    [provider]  oxyCODONE-acetaminophen (PERCOCET/ROXICET) 5-325 MG tablet Take 1 tablet by mouth every 4 (four) hours as needed. 10/17/17   Burgess Amor, PA-C  polyethylene glycol (MIRALAX) packet Take 17 g by mouth daily as  needed for moderate constipation. 08/28/17   Glade Lloyd, MD  pravastatin (PRAVACHOL) 20 MG tablet Take 20 mg by mouth daily.    [provider]  thiamine 100 MG tablet Take 1 tablet (100 mg total) by mouth daily. Patient not taking: Reported on 10/17/2017 08/29/17   Glade Lloyd, MD  TOUJEO MAX SOLOSTAR 300 UNIT/ML SOPN Inject 130 Units into the skin daily.  08/18/17   [provider]      Allergies    Hydrocodone and Venlafaxine    Review of Systems   Review of Systems  All other systems reviewed and are negative.   Physical Exam Updated Vital Signs BP (!) 148/92   Pulse (!) 111   Temp 98.3 F (36.8 C) (Oral)   Resp  18   Ht 5\' 11"  (1.803 m)   Wt 108.9 kg   SpO2 97%   BMI 33.47 kg/m  Physical Exam Vitals and nursing note reviewed.  Constitutional:      General: He is not in acute distress.    Appearance: He is well-developed. He is not diaphoretic.  HENT:     Head: Normocephalic and atraumatic.  Cardiovascular:     Rate and Rhythm: Normal rate and regular rhythm.     Heart sounds: No murmur heard.    No friction rub.  Pulmonary:     Effort: Pulmonary effort is normal. No respiratory distress.     Breath sounds: Normal breath sounds. No wheezing or rales.  Abdominal:     General: Bowel sounds are normal. There is no distension.     Palpations: Abdomen is soft.     Tenderness: There is no abdominal tenderness.  Musculoskeletal:        General: Normal range of motion.     Cervical back: Normal range of motion and neck supple.  Skin:    General: Skin is warm and dry.  Neurological:     Mental Status: He is alert and oriented to person, place, and time.     Coordination: Coordination normal.     ED Results / Procedures / Treatments   Labs (all labs ordered are listed, but only abnormal results are displayed) Labs Reviewed  COMPREHENSIVE METABOLIC PANEL  CBC WITH DIFFERENTIAL/PLATELET  PROTIME-INR  AMMONIA  URINALYSIS, ROUTINE W REFLEX MICROSCOPIC  TROPONIN I (HIGH SENSITIVITY)    EKG EKG Interpretation  Date/Time:  Friday Sep 14 2022 00:30:01 EDT Ventricular Rate:  111 PR Interval:  162 QRS Duration: 99 QT Interval:  363 QTC Calculation: 494 R Axis:   70 Text Interpretation: Sinus tachycardia Low voltage, extremity leads Borderline prolonged QT interval Baseline wander in lead(s) II III aVR aVL aVF V2 Confirmed by Geoffery Lyons ([id number]) on 09/14/2022 12:32:56 AM  Radiology No results found.  Procedures Procedures    Medications Ordered in ED Medications - No data to display  ED Course/ Medical Decision Making/ A&P  Patient is a 57 year old male with history of  cirrhosis and diabetes presenting with complaints of weakness and excessive sleepiness that has been worsening over the past several weeks.  Patient arrives here with stable vital signs.  He is slightly pale appearing, but physical examination otherwise unremarkable.  Workup initiated including CBC, CMP, troponin, and ammonia level.  He has a hemoglobin of 8.1 which is actually improved from 3 weeks ago when it was 7.8.  Blood sugar is 462.  Remainder of laboratory studies either normal or unremarkable.  Patient's blood sugar returned significantly elevated and I suspect that this  is the cause of his weakness.  He was hydrated with normal saline and given 10 units of NovoLog.  His repeat is now 252.  The remainder of the workup shows no obvious abnormality.  His ammonia level is normal.  I see nothing emergent and feel as though he can safely be discharged.  I have advised him to take a record of his blood sugars at home and continue his diabetes medicines as scheduled.  From what he tells me, I get the feeling he may not be completely compliant with his medications.  Final Clinical Impression(s) / ED Diagnoses Final diagnoses:  None    Rx / DC Orders ED Discharge Orders     None         Geoffery Lyons, MD 09/14/22 1610    Geoffery Lyons, MD 09/14/22 [phone]

## 2022-09-14 NOTE — ED Notes (Signed)
ED Provider at bedside. 

## 2022-09-14 NOTE — ED Triage Notes (Signed)
Pt c/o of weakness and "doesn't feel right" the last week. Progressively has gotten weak since previous fall. Pt concerned about low hbg again.

## 2022-09-14 NOTE — ED Notes (Signed)
Pt has urinal at bedside and aware we need a urine sample. Will continue care of pt

## 2022-09-14 NOTE — Discharge Instructions (Signed)
Continue medications as previously prescribed.  Keep a record of your blood sugars at home and follow-up with your primary doctor next week to go over the results.  Return to the ER if your symptoms significantly worsen or change.

## 2022-10-16 ENCOUNTER — Encounter (INDEPENDENT_AMBULATORY_CARE_PROVIDER_SITE_OTHER): Payer: Self-pay | Admitting: *Deleted

## 2022-10-17 ENCOUNTER — Telehealth: Payer: Self-pay | Admitting: Emergency Medicine

## 2022-10-17 NOTE — Telephone Encounter (Signed)
Pts caregiver called to verify orders for for iron infusions from Dr Scharlene Gloss office.  Left VM with Dr Marcelo Baldy office to verify as the infusion clinic has not received any orders at this time.  Waiting for call back from Dr Marcelo Baldy office. Caregiver updated and verbalized understanding.

## 2022-10-18 ENCOUNTER — Other Ambulatory Visit: Payer: Self-pay

## 2022-10-18 DIAGNOSIS — D509 Iron deficiency anemia, unspecified: Secondary | ICD-10-CM | POA: Insufficient documentation

## 2022-10-18 DIAGNOSIS — D500 Iron deficiency anemia secondary to blood loss (chronic): Secondary | ICD-10-CM

## 2022-10-24 ENCOUNTER — Telehealth: Payer: Self-pay | Admitting: Pharmacy Technician

## 2022-10-24 NOTE — Telephone Encounter (Addendum)
Patient will be scheduled as soon as possible.  Auth Submission: NO AUTH NEEDED Site of care: Site of care: [facility] Payer: [place] [employer] Medication & CPT/J Code(s) submitted: Feraheme (ferumoxytol) [id number] Route of submission (phone, fax, portal):  Phone # Fax # Auth type: Buy/Bill Units/visits requested: 2 Reference number:  Approval from: 10/24/22 to 02/24/23    2nd verify: [id number]

## 2022-11-07 ENCOUNTER — Encounter (INDEPENDENT_AMBULATORY_CARE_PROVIDER_SITE_OTHER): Payer: Self-pay | Admitting: Gastroenterology

## 2022-11-07 ENCOUNTER — Encounter (HOSPITAL_COMMUNITY): Payer: Self-pay | Admitting: Internal Medicine

## 2022-11-07 ENCOUNTER — Ambulatory Visit (INDEPENDENT_AMBULATORY_CARE_PROVIDER_SITE_OTHER): Payer: Self-pay | Admitting: Gastroenterology

## 2022-11-07 ENCOUNTER — Encounter (INDEPENDENT_AMBULATORY_CARE_PROVIDER_SITE_OTHER): Payer: Self-pay

## 2022-11-07 VITALS — BP 118/77 | HR 96 | Temp 97.1°F | Ht 71.0 in | Wt 250.0 lb

## 2022-11-07 DIAGNOSIS — K7460 Unspecified cirrhosis of liver: Secondary | ICD-10-CM

## 2022-11-07 NOTE — Progress Notes (Deleted)
57 year-old man with alcoholic cirrhosis (on bumex and aldactone at home), recurrent pancreatitis, obesity here with epigastric pain radiating to the back. Found to have acute pancreatitis. Presenting for hospital follow up.  **Recurrent acute alcoholic pancreatitis  EtOH abuse: Stopped Ozempic 3 weeks ago. CT with large pancreatic fluid collection, no e/o infection. Declined EtOH rehab resources.Pt reports drinking about 3 drinks a day.  -Folate 1 mg qd  **Acute on chronic anemia with Fe Deficiency  Suspect chronic GI bleed, at risk for varices: Pt refused scopes. Received 1 u pRBCs -Holding anticoagulation -Fe sulfate q MWF -Omeprazole 80 mg qd only taking 20 mg currently, will increase to 40 BID until follows up with PCP -Could qualify for IV iron if fails oral replacement  **Alcoholic cirrhosis with ascites  Steatohepatitis: Decompensated. Pt weight is up about 20 labs from January.  -Spironolactone 50 mg qd -> Increase to 100 mg qd today will recheck chem in 2 weeks **MG RESULTED LOW, will recheck in 2 weeks, encourage Mg adherence and recheck, if good then will increase to 100 mg. -Bumex 4 mg qd -Rec 2 G salt diet -Rec decrease fluid intake by 30% -Atarax prn itching   This is a 57 year-old man with alcoholic cirrhosis (on bumex and aldactone at home), recurrent pancreatitis, obesity here with epigastric pain radiating to the back. Found to have acute pancreatitis.   Acute alcoholic pancreatitis - recurrent, due to alcohol use. Multiple episodes in the past for pancreatitis secondary to alcohol use. Last drink was 2/21 though. Ozempic was stopped 3 weeks ago. Pain was better-controlled 2/25 so advanced to low fat diet but had some discomfort with eating but was eating a cheesecake. Emphasized low fat diet. CT on 224 showing 15.2 x 6.5 x 6.7 cm fluid collection related to pancreatitis and based on imaging appears to be an acute pancreatic collection. Discussed with GI and since this is a  new collection would not be indication to drink currently until matures. Is not showing signs of superimposed infection currently. Refusing resources for alcohol rehab. Diet advanced to full diet and patient able to tolerate and asked to go home so was discharged. Importance of avoiding alcohol emphasized to the patient and the patient instructed to follow-up with GI doctor to further monitor her fluid pocket because they mature her time in the drainage. Given strict return precautions for fevers, chills, other concerning symptoms.  Acute on chronic anemia with iron deficiency Baseline hgb has trended down episodically in the past year. On this admission, presented 2/24 with hgb 7.8, decreased to 6.8 on 2/26. Improved to 7.7 today (2/27) with 1 unit of packed red blood cells suspect cause is intermittent bleeding perhaps secondary to portal hypertensive gastropathy with inability to reproduce RBCs to recover to prior baseline. Compounding factor could be malnutrition in setting of other comorbidities. GI was consulted and EGD was suggested for variceal screening as well as to monitor for portal hypertensive gastropathy or PID. Patient refused this. He did require he is in the past. Started on iron supplementation based on iron deficiency. Will discharge on increased dose of PPI. Instructed patient not to use NSAID or Goody or BC powder as could worsen bleeding. Discussed risk benefits of anticoagulation with the patient that since he is not doing an EGD that we would hold it until follow-up to ensure hemoglobin stable. Should recheck CBC at follow-up

## 2022-11-09 ENCOUNTER — Encounter (HOSPITAL_COMMUNITY)
Admission: RE | Admit: 2022-11-09 | Discharge: 2022-11-09 | Disposition: A | Discharge status: Home / Self Care | Payer: BLUE CROSS/BLUE SHIELD | Source: Ambulatory Visit | Attending: Internal Medicine | Admitting: Internal Medicine

## 2022-11-09 NOTE — Progress Notes (Signed)
Called patient and left voice mail that his appointment for today was cancelled due to computer outage. Informed patient that we would call next week and get him rescheduled.

## 2022-11-16 ENCOUNTER — Encounter (HOSPITAL_COMMUNITY)
Admission: RE | Admit: 2022-11-16 | Discharge: 2022-11-16 | Disposition: A | Discharge status: Home / Self Care | Payer: BLUE CROSS/BLUE SHIELD | Source: Ambulatory Visit | Attending: Internal Medicine | Admitting: Internal Medicine

## 2022-11-16 VITALS — BP 149/97 | HR 95 | Temp 98.1°F | Resp 18

## 2022-11-16 DIAGNOSIS — D500 Iron deficiency anemia secondary to blood loss (chronic): Secondary | ICD-10-CM | POA: Diagnosis not present

## 2022-11-16 DIAGNOSIS — Z01818 Encounter for other preprocedural examination: Secondary | ICD-10-CM | POA: Diagnosis not present

## 2022-11-16 MED ORDER — ACETAMINOPHEN 325 MG PO TABS
650.0000 mg | ORAL_TABLET | Freq: Once | ORAL | Status: AC
  Administered 2022-11-16: 650 mg via ORAL

## 2022-11-16 MED ORDER — SODIUM CHLORIDE 0.9 % IV SOLN
510.0000 mg | Freq: Once | INTRAVENOUS | Status: AC
  Administered 2022-11-16: 510 mg via INTRAVENOUS
  Filled 2022-11-16: qty 510

## 2022-11-16 MED ORDER — DIPHENHYDRAMINE HCL 25 MG PO CAPS
25.0000 mg | ORAL_CAPSULE | Freq: Once | ORAL | Status: AC
  Administered 2022-11-16: 25 mg via ORAL

## 2022-11-16 NOTE — Progress Notes (Signed)
Diagnosis: Iron Deficiency Anemia  Provider:  Dwana Melena MD  Procedure: IV Infusion  IV Type: Peripheral, IV Location: L Forearm  Feraheme (Ferumoxytol), Dose: 510 mg  Infusion Start Time: 1133  Infusion Stop Time: 1150  Post Infusion IV Care: Observation period completed and Peripheral IV Discontinued  Discharge: Condition: Good, Destination: Home . AVS Provided  Performed by:  Arrie Senate, RN

## 2022-11-23 ENCOUNTER — Ambulatory Visit: Payer: BLUE CROSS/BLUE SHIELD | Admitting: Internal Medicine

## 2022-11-23 ENCOUNTER — Other Ambulatory Visit (HOSPITAL_COMMUNITY)
Admission: RE | Admit: 2022-11-23 | Discharge: 2022-11-23 | Disposition: A | Discharge status: Home / Self Care | Payer: BLUE CROSS/BLUE SHIELD | Source: Ambulatory Visit | Attending: Internal Medicine | Admitting: Internal Medicine

## 2022-11-23 ENCOUNTER — Encounter (HOSPITAL_COMMUNITY): Payer: BLUE CROSS/BLUE SHIELD | Attending: Internal Medicine | Admitting: Emergency Medicine

## 2022-11-23 ENCOUNTER — Encounter (HOSPITAL_COMMUNITY): Admission: RE | Admit: 2022-11-23 | Payer: BLUE CROSS/BLUE SHIELD | Source: Ambulatory Visit

## 2022-11-23 ENCOUNTER — Encounter: Payer: Self-pay | Admitting: Internal Medicine

## 2022-11-23 ENCOUNTER — Ambulatory Visit: Payer: BLUE CROSS/BLUE SHIELD | Attending: Internal Medicine | Admitting: Internal Medicine

## 2022-11-23 VITALS — BP 138/86 | HR 85 | Ht 71.0 in | Wt 268.6 lb

## 2022-11-23 VITALS — BP 130/86 | HR 95 | Temp 98.3°F | Resp 18

## 2022-11-23 DIAGNOSIS — I509 Heart failure, unspecified: Secondary | ICD-10-CM | POA: Insufficient documentation

## 2022-11-23 DIAGNOSIS — K7030 Alcoholic cirrhosis of liver without ascites: Secondary | ICD-10-CM | POA: Diagnosis not present

## 2022-11-23 DIAGNOSIS — R601 Generalized edema: Secondary | ICD-10-CM | POA: Diagnosis not present

## 2022-11-23 DIAGNOSIS — D500 Iron deficiency anemia secondary to blood loss (chronic): Secondary | ICD-10-CM | POA: Insufficient documentation

## 2022-11-23 DIAGNOSIS — D509 Iron deficiency anemia, unspecified: Secondary | ICD-10-CM

## 2022-11-23 LAB — BASIC METABOLIC PANEL
Anion gap: 7 (ref 5–15)
BUN: 12 mg/dL (ref 6–20)
CO2: 23 mmol/L (ref 22–32)
Calcium: 7.7 mg/dL — ABNORMAL LOW (ref 8.9–10.3)
Chloride: 104 mmol/L (ref 98–111)
Creatinine, Ser: 1.09 mg/dL (ref 0.61–1.24)
GFR, Estimated: >60 mL/min (ref >60)
Glucose, Bld: 202 mg/dL — ABNORMAL HIGH (ref 70–99)
Potassium: 4 mmol/L (ref 3.5–5.1)
Sodium: 134 mmol/L — ABNORMAL LOW (ref 135–145)

## 2022-11-23 LAB — BRAIN NATRIURETIC PEPTIDE: B Natriuretic Peptide: 66 pg/mL (ref 0.0–100.0)

## 2022-11-23 MED ORDER — BUMETANIDE 2 MG PO TABS: 2.0000 mg | ORAL_TABLET | Freq: Every day | ORAL | 3 refills | Status: DC

## 2022-11-23 MED ORDER — DIPHENHYDRAMINE HCL 25 MG PO CAPS
25.0000 mg | ORAL_CAPSULE | Freq: Once | ORAL | Status: AC
  Administered 2022-11-23: 25 mg via ORAL

## 2022-11-23 MED ORDER — ACETAMINOPHEN 325 MG PO TABS
650.0000 mg | ORAL_TABLET | Freq: Once | ORAL | Status: AC
  Administered 2022-11-23: 650 mg via ORAL

## 2022-11-23 MED ORDER — SODIUM CHLORIDE 0.9 % IV SOLN
510.0000 mg | Freq: Once | INTRAVENOUS | Status: AC
  Administered 2022-11-23: 510 mg via INTRAVENOUS
  Filled 2022-11-23: qty 17

## 2022-11-23 NOTE — Progress Notes (Signed)
Diagnosis: Iron Deficiency Anemia  Provider:  Dwana Melena MD  Procedure: IV Infusion  IV Type: Peripheral, IV Location: L Antecubital  Feraheme (Ferumoxytol), Dose: 510 mg  Infusion Start Time: 1045  Infusion Stop Time: 1105  Post Infusion IV Care: Observation period completed and Peripheral IV Discontinued  Discharge: Condition: Good, Destination: Home . AVS Provided  Performed by:  Arrie Senate, RN

## 2022-11-23 NOTE — Patient Instructions (Signed)
Medication Instructions:  Your physician has recommended you make the following change in your medication:   Restart Bumex 2 mg Daily   *If you need a refill on your cardiac medications before your next appointment, please call your pharmacy*   Lab Work: Your physician recommends that you return for lab work in: Today  Your physician recommends that you return for lab work in: Friday (August 9)    If you have labs (blood work) drawn today and your tests are completely normal, you will receive your results only by: [employer] (if you have MyChart) OR A paper copy in the mail If you have any lab test that is abnormal or we need to change your treatment, we will call you to review the results.   Testing/Procedures: Your physician has requested that you have an echocardiogram. Echocardiography is a painless test that uses sound waves to create images of your heart. It provides your doctor with information about the size and shape of your heart and how well your heart's chambers and valves are working. This procedure takes approximately one hour. There are no restrictions for this procedure. Please do NOT wear cologne, perfume, aftershave, or lotions (deodorant is allowed). Please arrive 15 minutes prior to your appointment time.    Follow-Up: At [facility], you and your health needs are our priority.  As part of our continuing mission to provide you with exceptional heart care, we have created designated Provider Care Teams.  These Care Teams include your primary Cardiologist (physician) and Advanced Practice Providers (APPs -  Physician Assistants and Nurse Practitioners) who all work together to provide you with the care you need, when you need it.  We recommend signing up for the patient portal called "MyChart".  Sign up information is provided on this After Visit Summary.  MyChart is used to connect with patients for Virtual Visits (Telemedicine).  Patients are able to  view lab/test results, encounter notes, upcoming appointments, etc.  Non-urgent messages can be sent to your provider as well.   To learn more about what you can do with MyChart, go to [email or web address].    Your next appointment:   1 month(s)  Provider:   You may see Dietrich Pates, MD or one of the following Advanced Practice Providers on your designated Care Team:   Randall An, PA-C  Jacolyn Reedy, PA-C     Other Instructions Thank you for choosing [facility]!

## 2022-11-23 NOTE — Progress Notes (Signed)
Cardiology Office Note   Date:  11/23/2022   ID:  [name], DOB 06/06/1965, MRN [id number]  PCP:  Benita Stabile, MD  Cardiologist:   Dietrich Pates, MD   PT referred for evaluation of CHF    History of Present Illness: [name] is a 57 y.o. male with a history of COPD on BiPAP), OSA, atrial fibrillation, DM, idiopathic demyelinating polyneuropathy, EtOH, HFpEF   RHC after diuresis was normal   CT of chest (done at [facility] in Feb 2024 showed severe coronary calcifications, patchy airspace disease)  Patient seen [facility]  He was admitted for LE swelling about  5 or 6 years ago  swelling   Diuresed Did better    The pt says over the  last 4 wks he has developed LE swelling   He does say that he ran out of Bumex Not sure when    He is still taking spironolactone     He denies CP though every once in awhile a sensation of "lightness" comes across chest   Gets a little lightheaded   Current Meds  Medication Sig   acetaminophen (TYLENOL) 500 MG tablet Take 1,000 mg by mouth 2 (two) times daily.    allopurinol (ZYLOPRIM) 300 MG tablet Take 300 mg by mouth daily.    bumetanide (BUMEX) 2 MG tablet Take 4 mg by mouth daily.   Chlorpheniramine Maleate (ALLERGY RELIEF PO) Take by mouth.   ferrous sulfate 325 (65 FE) MG tablet Take 325 mg by mouth daily with breakfast.   folic acid (FOLVITE) 1 MG tablet Take 1 tablet (1 mg total) by mouth daily.   gabapentin (NEURONTIN) 300 MG capsule Take 2 capsules by mouth at bedtime. 600 mg 2-3 times per day   hydrOXYzine (ATARAX) 25 MG tablet Take 50 mg by mouth 3 (three) times daily.   JARDIANCE 25 MG TABS tablet Take 25 mg by mouth daily.   LANTUS SOLOSTAR 100 UNIT/ML Solostar Pen SMARTSIG:5 Unit(s) SUB-Q Every Night   loperamide (IMODIUM) 2 MG capsule Take by mouth as needed for diarrhea or loose stools.   MAGNESIUM-OXIDE 400 (240 Mg) MG tablet Take 2 tablets by mouth daily.   melatonin 3 MG TABS tablet Take by mouth.   omeprazole (PRILOSEC) 20 MG  capsule Take 20 mg by mouth daily.   pravastatin (PRAVACHOL) 20 MG tablet Take 20 mg by mouth daily.   Simethicone (GAS RELIEF PO) Take by mouth.   spironolactone (ALDACTONE) 100 MG tablet Take by mouth daily.   tamsulosin (FLOMAX) 0.4 MG CAPS capsule Take 0.4 mg by mouth daily.     Allergies:   Hydrocodone and Venlafaxine   Past Medical History:  Diagnosis Date   Cirrhosis of liver (HCC)    Degeneration of lumbar intervertebral disc    Diabetes mellitus without complication (HCC)    Diabetic neuropathy (HCC) 03/25/2012   Diverticulitis    DM type 2 (diabetes mellitus, type 2) (HCC) 03/24/2012   Gout    hx of   Hyperlipidemia 03/24/2012   Hypertension    Incisional hernia without mention of obstruction or gangrene 02/07/2011   Pancreatitis    Paroxysmal atrial fibrillation (HCC) 03/25/2012   Transient, occurred off of atenolol.   Renal disorder     Past Surgical History:  Procedure Laterality Date   APPENDECTOMY  04/15/2009   COLON SURGERY  04/15/2009   HERNIA REPAIR  01/25/11   lap ventral hernia repair x9   LAPAROSCOPIC CHOLECYSTECTOMY  1993  LAPAROSCOPIC LYSIS OF ADHESIONS N/A 09/17/2012   Procedure: LAPAROSCOPIC and Open  LYSIS OF ADHESIONS;  Surgeon: Ardeth Sportsman, MD;  Location: [facility];  Service: General;  Laterality: N/A;   VENTRAL HERNIA REPAIR N/A 09/17/2012   Procedure: LAPAROSCOPIC VENTRAL HERNIA;  Surgeon: Ardeth Sportsman, MD;  Location: [facility];  Service: General;  Laterality: N/A;     Social History:  The patient  reports that he has been smoking cigarettes. He started smoking about 42 years ago. He has a 30 pack-year smoking history. He has never used smokeless tobacco. He reports current alcohol use. He reports that he does not use drugs.   Family History:  The patient's family history includes Hypertension in his father and mother.    ROS:  Please see the history of present illness. All other systems are reviewed and  Negative to the above problem  except as noted.    PHYSICAL EXAM: VS:  BP 138/86 (BP Location: Left Arm, Patient Position: Sitting, Cuff Size: Normal)   Pulse 85   Ht 5\' 11"  (1.803 m)   Wt 268 lb 9.6 oz (121.8 kg)   SpO2 92%   BMI 37.46 kg/m   GEN:  Obese 57 yo  in no acute distress  HEENT: normal  Neck: no JVD, carotid bruits Cardiac: RRR; no murmurs  1+ edema  Respiratory:  clear to auscultation  GI: soft, nontender, obese  NO masses  MS: no deformity Moving all extremities   Skin: warm and dry, no rash Neuro:  Strength and sensation are intact Psych: euthymic mood, full affect   EKG:  EKG is not ordered today.   Lipid Panel    Component Value Date/Time   CHOL 232 (H) 03/23/2012 0752   TRIG 348 (H) 03/23/2012 0752   HDL 43 03/23/2012 0752   CHOLHDL 5.4 03/23/2012 0752   VLDL 70 (H) 03/23/2012 0752   LDLCALC 119 (H) 03/23/2012 0752      Wt Readings from Last 3 Encounters:  11/23/22 268 lb 9.6 oz (121.8 kg)  11/07/22 250 lb (113.4 kg)  09/14/22 240 lb (108.9 kg)      ASSESSMENT AND PLAN:  PT presents for continued cardiac care  [facility]  1 HFpEF  Pt out of diuretic for awhile    VOlume is up    Will resume   2 mg Bumex    Labs today    (BMET and BNP)  Repeat labs in 10 days  Will get echo to reevaluate LVEf  2 Hx Atrial fibrillation  Last EKG in May   NSR    Clincally in NSR    Need to review  Not on anticoagulant  3  HTN    BP was mildly increased   VOlume is up   Will need to follow as diurese.    4  LIpids   LDL 13, HDL 41, Trig 44   (June 2024)  5  DM   Last A1C 7.6  Reviewed diet   Minimize carbs and processed foods     Tobacco use   Had quit  Back to smoking     Current medicines are reviewed at length with the patient today.  The patient does not have concerns  .regarding medicines.  Signed, Dietrich Pates, MD  11/23/2022 12:46 PM    [facility] [address] [place], [place], [place]  [address] Phone: [phone]; Fax: [phone]

## 2022-11-24 ENCOUNTER — Emergency Department (HOSPITAL_COMMUNITY): Payer: BLUE CROSS/BLUE SHIELD

## 2022-11-24 ENCOUNTER — Encounter (HOSPITAL_COMMUNITY): Payer: Self-pay

## 2022-11-24 ENCOUNTER — Other Ambulatory Visit: Payer: Self-pay

## 2022-11-24 ENCOUNTER — Inpatient Hospital Stay (HOSPITAL_COMMUNITY)
Admission: EM | Admit: 2022-11-24 | Discharge: 2022-12-01 | DRG: 433 | Disposition: A | Discharge status: Home / Self Care | Payer: BLUE CROSS/BLUE SHIELD | Attending: Family Medicine | Admitting: Family Medicine

## 2022-11-24 DIAGNOSIS — Z23 Encounter for immunization: Secondary | ICD-10-CM

## 2022-11-24 DIAGNOSIS — E669 Obesity, unspecified: Secondary | ICD-10-CM | POA: Diagnosis present

## 2022-11-24 DIAGNOSIS — J449 Chronic obstructive pulmonary disease, unspecified: Secondary | ICD-10-CM | POA: Diagnosis present

## 2022-11-24 DIAGNOSIS — F419 Anxiety disorder, unspecified: Secondary | ICD-10-CM | POA: Diagnosis present

## 2022-11-24 DIAGNOSIS — Z713 Dietary counseling and surveillance: Secondary | ICD-10-CM

## 2022-11-24 DIAGNOSIS — F17210 Nicotine dependence, cigarettes, uncomplicated: Secondary | ICD-10-CM | POA: Diagnosis present

## 2022-11-24 DIAGNOSIS — I4891 Unspecified atrial fibrillation: Secondary | ICD-10-CM | POA: Diagnosis not present

## 2022-11-24 DIAGNOSIS — Z720 Tobacco use: Secondary | ICD-10-CM

## 2022-11-24 DIAGNOSIS — K219 Gastro-esophageal reflux disease without esophagitis: Secondary | ICD-10-CM | POA: Diagnosis present

## 2022-11-24 DIAGNOSIS — K7030 Alcoholic cirrhosis of liver without ascites: Secondary | ICD-10-CM | POA: Diagnosis present

## 2022-11-24 DIAGNOSIS — I110 Hypertensive heart disease with heart failure: Secondary | ICD-10-CM | POA: Diagnosis present

## 2022-11-24 DIAGNOSIS — I10 Essential (primary) hypertension: Secondary | ICD-10-CM | POA: Diagnosis not present

## 2022-11-24 DIAGNOSIS — F1010 Alcohol abuse, uncomplicated: Secondary | ICD-10-CM | POA: Diagnosis present

## 2022-11-24 DIAGNOSIS — Z79899 Other long term (current) drug therapy: Secondary | ICD-10-CM

## 2022-11-24 DIAGNOSIS — N400 Enlarged prostate without lower urinary tract symptoms: Secondary | ICD-10-CM | POA: Diagnosis present

## 2022-11-24 DIAGNOSIS — R531 Weakness: Secondary | ICD-10-CM | POA: Diagnosis present

## 2022-11-24 DIAGNOSIS — B372 Candidiasis of skin and nail: Secondary | ICD-10-CM | POA: Diagnosis present

## 2022-11-24 DIAGNOSIS — R251 Tremor, unspecified: Secondary | ICD-10-CM | POA: Diagnosis present

## 2022-11-24 DIAGNOSIS — D638 Anemia in other chronic diseases classified elsewhere: Secondary | ICD-10-CM | POA: Diagnosis present

## 2022-11-24 DIAGNOSIS — I272 Pulmonary hypertension, unspecified: Secondary | ICD-10-CM | POA: Diagnosis present

## 2022-11-24 DIAGNOSIS — F32A Depression, unspecified: Secondary | ICD-10-CM | POA: Diagnosis present

## 2022-11-24 DIAGNOSIS — M109 Gout, unspecified: Secondary | ICD-10-CM | POA: Diagnosis present

## 2022-11-24 DIAGNOSIS — Z9049 Acquired absence of other specified parts of digestive tract: Secondary | ICD-10-CM

## 2022-11-24 DIAGNOSIS — E785 Hyperlipidemia, unspecified: Secondary | ICD-10-CM | POA: Diagnosis present

## 2022-11-24 DIAGNOSIS — I480 Paroxysmal atrial fibrillation: Secondary | ICD-10-CM | POA: Diagnosis present

## 2022-11-24 DIAGNOSIS — Z794 Long term (current) use of insulin: Secondary | ICD-10-CM

## 2022-11-24 DIAGNOSIS — Z8249 Family history of ischemic heart disease and other diseases of the circulatory system: Secondary | ICD-10-CM

## 2022-11-24 DIAGNOSIS — E1140 Type 2 diabetes mellitus with diabetic neuropathy, unspecified: Secondary | ICD-10-CM | POA: Diagnosis present

## 2022-11-24 DIAGNOSIS — G6181 Chronic inflammatory demyelinating polyneuritis: Secondary | ICD-10-CM | POA: Diagnosis present

## 2022-11-24 DIAGNOSIS — E1142 Type 2 diabetes mellitus with diabetic polyneuropathy: Secondary | ICD-10-CM | POA: Diagnosis present

## 2022-11-24 DIAGNOSIS — E8809 Other disorders of plasma-protein metabolism, not elsewhere classified: Secondary | ICD-10-CM | POA: Diagnosis present

## 2022-11-24 DIAGNOSIS — R601 Generalized edema: Secondary | ICD-10-CM | POA: Diagnosis not present

## 2022-11-24 DIAGNOSIS — R197 Diarrhea, unspecified: Secondary | ICD-10-CM | POA: Diagnosis present

## 2022-11-24 DIAGNOSIS — I5032 Chronic diastolic (congestive) heart failure: Secondary | ICD-10-CM | POA: Diagnosis present

## 2022-11-24 DIAGNOSIS — R1115 Cyclical vomiting syndrome unrelated to migraine: Secondary | ICD-10-CM | POA: Diagnosis present

## 2022-11-24 DIAGNOSIS — N289 Disorder of kidney and ureter, unspecified: Secondary | ICD-10-CM | POA: Diagnosis present

## 2022-11-24 DIAGNOSIS — Z7984 Long term (current) use of oral hypoglycemic drugs: Secondary | ICD-10-CM

## 2022-11-24 DIAGNOSIS — Z885 Allergy status to narcotic agent status: Secondary | ICD-10-CM

## 2022-11-24 DIAGNOSIS — E1165 Type 2 diabetes mellitus with hyperglycemia: Secondary | ICD-10-CM | POA: Diagnosis present

## 2022-11-24 DIAGNOSIS — K912 Postsurgical malabsorption, not elsewhere classified: Secondary | ICD-10-CM | POA: Diagnosis present

## 2022-11-24 DIAGNOSIS — Y838 Other surgical procedures as the cause of abnormal reaction of the patient, or of later complication, without mention of misadventure at the time of the procedure: Secondary | ICD-10-CM | POA: Diagnosis present

## 2022-11-24 DIAGNOSIS — Z716 Tobacco abuse counseling: Secondary | ICD-10-CM

## 2022-11-24 DIAGNOSIS — M5136 Other intervertebral disc degeneration, lumbar region: Secondary | ICD-10-CM | POA: Diagnosis present

## 2022-11-24 DIAGNOSIS — Z9889 Other specified postprocedural states: Secondary | ICD-10-CM

## 2022-11-24 DIAGNOSIS — E1149 Type 2 diabetes mellitus with other diabetic neurological complication: Secondary | ICD-10-CM | POA: Diagnosis not present

## 2022-11-24 DIAGNOSIS — Z888 Allergy status to other drugs, medicaments and biological substances status: Secondary | ICD-10-CM

## 2022-11-24 DIAGNOSIS — Z8719 Personal history of other diseases of the digestive system: Secondary | ICD-10-CM

## 2022-11-24 DIAGNOSIS — K7460 Unspecified cirrhosis of liver: Secondary | ICD-10-CM | POA: Diagnosis present

## 2022-11-24 DIAGNOSIS — Z6837 Body mass index (BMI) 37.0-37.9, adult: Secondary | ICD-10-CM

## 2022-11-24 DIAGNOSIS — G4730 Sleep apnea, unspecified: Secondary | ICD-10-CM | POA: Diagnosis present

## 2022-11-24 LAB — URINALYSIS, ROUTINE W REFLEX MICROSCOPIC
Bilirubin Urine: NEGATIVE
Glucose, UA: 50 mg/dL — AB
Ketones, ur: NEGATIVE mg/dL
Leukocytes,Ua: NEGATIVE
Nitrite: NEGATIVE
Protein, ur: NEGATIVE mg/dL
Specific Gravity, Urine: 1.006 (ref 1.005–1.030)
pH: 5 (ref 5.0–8.0)

## 2022-11-24 LAB — CBC WITH DIFFERENTIAL/PLATELET
Abs Immature Granulocytes: 0.04 10*3/uL (ref 0.00–0.07)
Basophils Absolute: 0 10*3/uL (ref 0.0–0.1)
Basophils Relative: 1 %
Eosinophils Absolute: 0.1 10*3/uL (ref 0.0–0.5)
Eosinophils Relative: 2 %
HCT: 31.1 % — ABNORMAL LOW (ref 39.0–52.0)
Hemoglobin: 9.2 g/dL — ABNORMAL LOW (ref 13.0–17.0)
Immature Granulocytes: 1 %
Lymphocytes Relative: 22 %
Lymphs Abs: 1.8 10*3/uL (ref 0.7–4.0)
MCH: 27.1 pg (ref 26.0–34.0)
MCHC: 29.6 g/dL — ABNORMAL LOW (ref 30.0–36.0)
MCV: 91.5 fL (ref 80.0–100.0)
Monocytes Absolute: 0.6 10*3/uL (ref 0.1–1.0)
Monocytes Relative: 7 %
Neutro Abs: 5.7 10*3/uL (ref 1.7–7.7)
Neutrophils Relative %: 67 %
Platelets: 156 10*3/uL (ref 150–400)
RBC: 3.4 MIL/uL — ABNORMAL LOW (ref 4.22–5.81)
RDW: 21.9 % — ABNORMAL HIGH (ref 11.5–15.5)
WBC: 8.2 10*3/uL (ref 4.0–10.5)
nRBC: 0 % (ref 0.0–0.2)

## 2022-11-24 LAB — COMPREHENSIVE METABOLIC PANEL
ALT: 13 U/L (ref 0–44)
AST: 24 U/L (ref 15–41)
Albumin: 1.6 g/dL — ABNORMAL LOW (ref 3.5–5.0)
Alkaline Phosphatase: 115 U/L (ref 38–126)
Anion gap: 4 — ABNORMAL LOW (ref 5–15)
BUN: 11 mg/dL (ref 6–20)
CO2: 26 mmol/L (ref 22–32)
Calcium: 7.8 mg/dL — ABNORMAL LOW (ref 8.9–10.3)
Chloride: 105 mmol/L (ref 98–111)
Creatinine, Ser: 0.96 mg/dL (ref 0.61–1.24)
GFR, Estimated: >60 mL/min (ref >60)
Glucose, Bld: 125 mg/dL — ABNORMAL HIGH (ref 70–99)
Potassium: 3.9 mmol/L (ref 3.5–5.1)
Sodium: 135 mmol/L (ref 135–145)
Total Bilirubin: 0.9 mg/dL (ref 0.3–1.2)
Total Protein: 6.3 g/dL — ABNORMAL LOW (ref 6.5–8.1)

## 2022-11-24 LAB — CK: Total CK: 98 U/L (ref 49–397)

## 2022-11-24 LAB — BRAIN NATRIURETIC PEPTIDE: B Natriuretic Peptide: 85 pg/mL (ref 0.0–100.0)

## 2022-11-24 LAB — MAGNESIUM: Magnesium: 1.5 mg/dL — ABNORMAL LOW (ref 1.7–2.4)

## 2022-11-24 LAB — GLUCOSE, CAPILLARY: Glucose-Capillary: 137 mg/dL — ABNORMAL HIGH (ref 70–99)

## 2022-11-24 LAB — TROPONIN I (HIGH SENSITIVITY)
Troponin I (High Sensitivity): 6 ng/L (ref <18)
Troponin I (High Sensitivity): 5 ng/L (ref <18)

## 2022-11-24 LAB — ETHANOL: Alcohol, Ethyl (B): <10 mg/dL (ref <10)

## 2022-11-24 MED ORDER — GABAPENTIN 300 MG PO CAPS
300.0000 mg | ORAL_CAPSULE | Freq: Two times a day (BID) | ORAL | Status: DC
  Administered 2022-11-24 – 2022-12-01 (×14): 300 mg via ORAL
  Filled 2022-11-24 (×14): qty 1

## 2022-11-24 MED ORDER — HEPARIN SODIUM (PORCINE) 5000 UNIT/ML IJ SOLN
5000.0000 [IU] | Freq: Three times a day (TID) | INTRAMUSCULAR | Status: DC
  Administered 2022-11-25 – 2022-12-01 (×18): 5000 [IU] via SUBCUTANEOUS
  Filled 2022-11-24 (×18): qty 1

## 2022-11-24 MED ORDER — IPRATROPIUM BROMIDE 0.02 % IN SOLN: 0.5000 mg | Freq: Four times a day (QID) | RESPIRATORY_TRACT | Status: DC

## 2022-11-24 MED ORDER — ADULT MULTIVITAMIN W/MINERALS CH
1.0000 | ORAL_TABLET | Freq: Every day | ORAL | Status: DC
  Administered 2022-11-24 – 2022-11-28 (×5): 1 via ORAL
  Filled 2022-11-24 (×5): qty 1

## 2022-11-24 MED ORDER — IPRATROPIUM-ALBUTEROL 0.5-2.5 (3) MG/3ML IN SOLN
3.0000 mL | Freq: Two times a day (BID) | RESPIRATORY_TRACT | Status: DC
  Administered 2022-11-25 – 2022-11-30 (×11): 3 mL via RESPIRATORY_TRACT
  Filled 2022-11-24 (×13): qty 3

## 2022-11-24 MED ORDER — FUROSEMIDE 10 MG/ML IJ SOLN
40.0000 mg | Freq: Two times a day (BID) | INTRAMUSCULAR | Status: DC
  Administered 2022-11-24 – 2022-11-27 (×7): 40 mg via INTRAVENOUS
  Filled 2022-11-24 (×8): qty 4

## 2022-11-24 MED ORDER — INSULIN ASPART 100 UNIT/ML IJ SOLN
0.0000 [IU] | Freq: Three times a day (TID) | INTRAMUSCULAR | Status: DC
  Administered 2022-11-25 – 2022-11-26 (×3): 1 [IU] via SUBCUTANEOUS
  Administered 2022-11-27: 2 [IU] via SUBCUTANEOUS
  Administered 2022-11-30: 1 [IU] via SUBCUTANEOUS
  Administered 2022-12-01: 2 [IU] via SUBCUTANEOUS
  Administered 2022-12-01: 1 [IU] via SUBCUTANEOUS

## 2022-11-24 MED ORDER — PNEUMOCOCCAL 20-VAL CONJ VACC 0.5 ML IM SUSY
0.5000 mL | PREFILLED_SYRINGE | INTRAMUSCULAR | Status: AC
  Administered 2022-11-27: 0.5 mL via INTRAMUSCULAR
  Filled 2022-11-24: qty 0.5

## 2022-11-24 MED ORDER — PANTOPRAZOLE SODIUM 40 MG PO TBEC
40.0000 mg | DELAYED_RELEASE_TABLET | Freq: Every day | ORAL | Status: DC
  Administered 2022-11-25 – 2022-12-01 (×6): 40 mg via ORAL
  Filled 2022-11-24 (×7): qty 1

## 2022-11-24 MED ORDER — MELATONIN 3 MG PO TABS
3.0000 mg | ORAL_TABLET | Freq: Every day | ORAL | Status: DC
  Administered 2022-11-24 – 2022-11-30 (×7): 3 mg via ORAL
  Filled 2022-11-24 (×7): qty 1

## 2022-11-24 MED ORDER — THIAMINE HCL 100 MG/ML IJ SOLN
100.0000 mg | Freq: Every day | INTRAMUSCULAR | Status: DC
  Administered 2022-11-24 – 2022-11-28 (×2): 100 mg via INTRAVENOUS
  Filled 2022-11-24 (×2): qty 2

## 2022-11-24 MED ORDER — INSULIN GLARGINE-YFGN 100 UNIT/ML ~~LOC~~ SOLN
5.0000 [IU] | Freq: Every day | SUBCUTANEOUS | Status: DC
  Administered 2022-11-25 – 2022-11-28 (×4): 5 [IU] via SUBCUTANEOUS
  Filled 2022-11-24 (×6): qty 0.05

## 2022-11-24 MED ORDER — ALBUTEROL SULFATE (2.5 MG/3ML) 0.083% IN NEBU: 2.5000 mg | INHALATION_SOLUTION | Freq: Four times a day (QID) | RESPIRATORY_TRACT | Status: DC

## 2022-11-24 MED ORDER — FOLIC ACID 1 MG PO TABS
1.0000 mg | ORAL_TABLET | Freq: Every day | ORAL | Status: DC
  Administered 2022-11-24 – 2022-11-28 (×5): 1 mg via ORAL
  Filled 2022-11-24 (×5): qty 1

## 2022-11-24 MED ORDER — THIAMINE MONONITRATE 100 MG PO TABS
100.0000 mg | ORAL_TABLET | Freq: Every day | ORAL | Status: DC
  Administered 2022-11-25 – 2022-11-27 (×3): 100 mg via ORAL
  Filled 2022-11-24 (×5): qty 1

## 2022-11-24 MED ORDER — LORAZEPAM 2 MG/ML IJ SOLN: 1.0000 mg | INTRAMUSCULAR | Status: AC | PRN

## 2022-11-24 MED ORDER — LORAZEPAM 1 MG PO TABS: 1.0000 mg | ORAL_TABLET | ORAL | Status: AC | PRN

## 2022-11-24 MED ORDER — ALBUMIN HUMAN 25 % IV SOLN
25.0000 g | Freq: Four times a day (QID) | INTRAVENOUS | Status: AC
  Administered 2022-11-24 – 2022-11-25 (×3): 25 g via INTRAVENOUS
  Filled 2022-11-24 (×3): qty 100

## 2022-11-24 MED ORDER — IPRATROPIUM-ALBUTEROL 0.5-2.5 (3) MG/3ML IN SOLN
3.0000 mL | Freq: Four times a day (QID) | RESPIRATORY_TRACT | Status: DC
  Administered 2022-11-24: 3 mL via RESPIRATORY_TRACT
  Filled 2022-11-24: qty 3

## 2022-11-24 MED ORDER — ALBUTEROL SULFATE (2.5 MG/3ML) 0.083% IN NEBU
2.5000 mg | INHALATION_SOLUTION | RESPIRATORY_TRACT | Status: DC | PRN
  Filled 2022-11-24: qty 3

## 2022-11-24 MED ORDER — ALLOPURINOL 100 MG PO TABS
300.0000 mg | ORAL_TABLET | Freq: Two times a day (BID) | ORAL | Status: DC
  Administered 2022-11-24 – 2022-12-01 (×14): 300 mg via ORAL
  Filled 2022-11-24 (×14): qty 3

## 2022-11-24 MED ORDER — EMPAGLIFLOZIN 25 MG PO TABS
25.0000 mg | ORAL_TABLET | Freq: Every day | ORAL | Status: DC
  Administered 2022-11-25 – 2022-11-28 (×4): 25 mg via ORAL
  Filled 2022-11-24 (×6): qty 1

## 2022-11-24 MED ORDER — FOLIC ACID 1 MG PO TABS: 1.0000 mg | ORAL_TABLET | Freq: Every day | ORAL | Status: DC

## 2022-11-24 MED ORDER — LORATADINE 10 MG PO TABS
10.0000 mg | ORAL_TABLET | Freq: Every day | ORAL | Status: DC
  Administered 2022-11-25 – 2022-12-01 (×7): 10 mg via ORAL
  Filled 2022-11-24 (×7): qty 1

## 2022-11-24 MED ORDER — NICOTINE 21 MG/24HR TD PT24
21.0000 mg | MEDICATED_PATCH | Freq: Every day | TRANSDERMAL | Status: DC
  Administered 2022-11-24 – 2022-12-01 (×8): 21 mg via TRANSDERMAL
  Filled 2022-11-24 (×7): qty 1

## 2022-11-24 MED ORDER — FUROSEMIDE 10 MG/ML IJ SOLN
40.0000 mg | Freq: Once | INTRAMUSCULAR | Status: AC
  Administered 2022-11-24: 40 mg via INTRAVENOUS
  Filled 2022-11-24: qty 4

## 2022-11-24 MED ORDER — MAGNESIUM OXIDE -MG SUPPLEMENT 400 (240 MG) MG PO TABS
800.0000 mg | ORAL_TABLET | Freq: Two times a day (BID) | ORAL | Status: DC
  Administered 2022-11-24 – 2022-12-01 (×14): 800 mg via ORAL
  Filled 2022-11-24 (×14): qty 2

## 2022-11-24 MED ORDER — MAGNESIUM SULFATE 2 GM/50ML IV SOLN
2.0000 g | Freq: Once | INTRAVENOUS | Status: AC
  Administered 2022-11-24: 2 g via INTRAVENOUS
  Filled 2022-11-24: qty 50

## 2022-11-24 MED ORDER — TAMSULOSIN HCL 0.4 MG PO CAPS
0.4000 mg | ORAL_CAPSULE | Freq: Every day | ORAL | Status: DC
  Administered 2022-11-25 – 2022-12-01 (×7): 0.4 mg via ORAL
  Filled 2022-11-24 (×7): qty 1

## 2022-11-24 MED ORDER — SPIRONOLACTONE 25 MG PO TABS
100.0000 mg | ORAL_TABLET | Freq: Every day | ORAL | Status: DC
  Administered 2022-11-25 – 2022-11-27 (×3): 100 mg via ORAL
  Filled 2022-11-24 (×4): qty 4

## 2022-11-24 NOTE — H&P (Signed)
TRH H&P   Patient Demographics:    [name], is a 57 y.o. male  MRN: [id number]   DOB - 1965-10-06  Admit Date - 11/24/2022  Outpatient Primary MD for the patient is Benita Stabile, MD  Referring MD/NP/PA: PA Tammy    Patient coming from: home  Chief Complaint  Patient presents with   Leg Swelling      HPI:    [name]  is a 57 y.o. male, with a past medical history of HTN, pulm HTN, HFpEF, peripheral neuropathy, tobacco use, alcohol abuse, cirrhosis, COPD, anxiety, depression, gout, back pain, T2DM, GERD, alcoholic pancreatitis at [facility] February of this year . -Patient presents to ED secondary to worsening edema and anasarca, still drinking alcohol, reports he cut back significantly down to 3 beers per day, patient report 10 pound weight gain over the last 10 days, he is on Bumex and Aldactone daily, history was obtained from fianc at bedside who is assisting with the history, currently patient has been out for Bumex for some time now, as he has been taking his meds from the pillbox without the Bumex, this is for unknown period of time, he was seen by cardiology yesterday, where his Bumex was refilled, he comes to ER today due to dyspnea on exertion and swelling of his legs, as well he reports generalized weakness and deconditioning, he is with chronic anemia, had iron infusion last Friday and Saturday ED workup significant for albumin of 1.6, magnesium of 1.5, anemia with hemoglobin of 9.2, platelets stable at 156 K's, LFTs within normal limit, he had extensive anasarca, edema up to lower abdomen, will need IV diuresis, [facility] hospitalist consulted to admit    Review of systems:      A full 10 point Review of Systems was done, except as stated above, all other Review of Systems were negative.   With Past History of the following :    Past Medical History:   Diagnosis Date   Cirrhosis of liver (HCC)    Degeneration of lumbar intervertebral disc    Diabetes mellitus without complication (HCC)    Diabetic neuropathy (HCC) 03/25/2012   Diverticulitis    DM type 2 (diabetes mellitus, type 2) (HCC) 03/24/2012   Gout    hx of   Hyperlipidemia 03/24/2012   Hypertension    Incisional hernia without mention of obstruction or gangrene 02/07/2011   Pancreatitis    Paroxysmal atrial fibrillation (HCC) 03/25/2012   Transient, occurred off of atenolol.   Renal disorder       Past Surgical History:  Procedure Laterality Date   APPENDECTOMY  04/15/2009   COLON SURGERY  04/15/2009   HERNIA REPAIR  01/25/11   lap ventral hernia repair x9   LAPAROSCOPIC CHOLECYSTECTOMY  1993   LAPAROSCOPIC LYSIS OF ADHESIONS N/A 09/17/2012   Procedure: LAPAROSCOPIC and Open  LYSIS OF ADHESIONS;  Surgeon:  Ardeth Sportsman, MD;  Location: [facility];  Service: General;  Laterality: N/A;   VENTRAL HERNIA REPAIR N/A 09/17/2012   Procedure: LAPAROSCOPIC VENTRAL HERNIA;  Surgeon: Ardeth Sportsman, MD;  Location: [facility];  Service: General;  Laterality: N/A;      Social History:     Social History   Tobacco Use   Smoking status: Every Day    Current packs/day: 0.00    Average packs/day: 1 pack/day for 30.0 years (30.0 ttl pk-yrs)    Types: Cigarettes    Start date: 05/18/1980    Last attempt to quit: 05/18/2010    Years since quitting: 12.5   Smokeless tobacco: Never  Substance Use Topics   Alcohol use: Yes    Comment: "moderate"        Family History :     Family History  Problem Relation Age of Onset   Hypertension Mother    Hypertension Father       Home Medications:   Prior to Admission medications   Medication Sig Start Date End Date Taking? Authorizing Provider  acetaminophen (TYLENOL) 500 MG tablet Take 1,000 mg by mouth 2 (two) times daily.    Yes [provider]  allopurinol (ZYLOPRIM) 300 MG tablet Take 300 mg by mouth 2 (two) times  daily. 01/21/11  Yes [provider]  bumetanide (BUMEX) 2 MG tablet Take 1 tablet (2 mg total) by mouth daily. 11/23/22  Yes Pricilla Riffle, MD  cetirizine (ZYRTEC) 10 MG tablet Take 10 mg by mouth daily.   Yes [provider]  folic acid (FOLVITE) 1 MG tablet Take 1 tablet (1 mg total) by mouth daily. 08/29/17  Yes Glade Lloyd, MD  gabapentin (NEURONTIN) 300 MG capsule Take 600 mg by mouth in the morning and at bedtime. *May take 600mg  in addition if needed 10/06/17  Yes [provider]  JARDIANCE 25 MG TABS tablet Take 25 mg by mouth daily. 10/05/22  Yes [provider]  LANTUS SOLOSTAR 100 UNIT/ML Solostar Pen 5-7 Units at bedtime.   Yes [provider]  loperamide (IMODIUM) 2 MG capsule Take 2 mg by mouth daily as needed for diarrhea or loose stools.   Yes [provider]  MAGNESIUM-OXIDE 400 (240 Mg) MG tablet Take 2 tablets by mouth 2 (two) times daily. 10/05/22  Yes [provider]  melatonin 3 MG TABS tablet Take 3 mg by mouth at bedtime.   Yes [provider]  omeprazole (PRILOSEC) 20 MG capsule Take 20 mg by mouth daily.   Yes [provider]  pravastatin (PRAVACHOL) 20 MG tablet Take 20 mg by mouth daily.   Yes [provider]  Simethicone (GAS RELIEF PO) Take 1 tablet by mouth daily as needed (for gas relief).   Yes [provider]  spironolactone (ALDACTONE) 100 MG tablet Take 100 mg by mouth daily.   Yes [provider]  tamsulosin (FLOMAX) 0.4 MG CAPS capsule Take 0.4 mg by mouth daily.   Yes [provider]  hydrOXYzine (ATARAX) 25 MG tablet Take 50 mg by mouth every 8 (eight) hours as needed for itching. 10/05/22   [provider]     Allergies:     Allergies  Allergen Reactions   Hydrocodone Itching   Venlafaxine Itching     Physical Exam:   Vitals  Blood pressure 135/83, pulse 97, temperature 97.8 F (36.6 C), temperature source Oral, resp. rate  17, height 5\' 11"  (1.803 m), weight 117.9 kg, SpO2  91%.   1. General male, appears older than stated age, laying in bed in no apparent distress  2. Normal affect and insight, Not Suicidal or Homicidal, Awake Alert, Oriented X 3.  3. No F.N deficits, ALL C.Nerves Intact, Strength 5/5 all 4 extremities, Sensation intact all 4 extremities, Plantars down going.  4. Ears and Eyes appear Normal, Conjunctivae clear, PERRLA. Moist Oral Mucosa.  5. Supple Neck, No JVD, No cervical lymphadenopathy appriciated, No Carotid Bruits.  6. Symmetrical Chest wall movement, Good air movement bilaterally, CTAB.  7. RRR, No Gallops, Rubs or Murmurs, No Parasternal Heave.  8. Positive Bowel Sounds, Abdomen Soft, No tenderness, No organomegaly appriciated,No rebound -guarding or rigidity.  9.  No Cyanosis, Normal Skin Turgor, No Skin Rash or Bruise.  10. Good muscle tone,  joints appear normal , no effusions, Normal ROM.  With extensive edema anasarca all the way to upper thighs lower abdomen     Data Review:    CBC Recent Labs  Lab 11/24/22 1356  WBC 8.2  HGB 9.2*  HCT 31.1*  PLT 156  MCV 91.5  MCH 27.1  MCHC 29.6*  RDW 21.9*  LYMPHSABS 1.8  MONOABS 0.6  EOSABS 0.1  BASOSABS 0.0   ------------------------------------------------------------------------------------------------------------------  Chemistries  Recent Labs  Lab 11/23/22 1422 11/24/22 1356  NA 134* 135  K 4.0 3.9  CL 104 105  CO2 23 26  GLUCOSE 202* 125*  BUN 12 11  CREATININE 1.09 0.96  CALCIUM 7.7* 7.8*  MG  --  1.5*  AST  --  24  ALT  --  13  ALKPHOS  --  115  BILITOT  --  0.9   ------------------------------------------------------------------------------------------------------------------ estimated creatinine clearance is 108.2 mL/min (by C-G formula based on SCr of 0.96 mg/dL). ------------------------------------------------------------------------------------------------------------------ No results  for input(s): "TSH", "T4TOTAL", "T3FREE", "THYROIDAB" in the last 72 hours.  Invalid input(s): "FREET3"  Coagulation profile No results for input(s): "INR", "PROTIME" in the last 168 hours. ------------------------------------------------------------------------------------------------------------------- No results for input(s): "DDIMER" in the last 72 hours. -------------------------------------------------------------------------------------------------------------------  Cardiac Enzymes No results for input(s): "CKMB", "TROPONINI", "MYOGLOBIN" in the last 168 hours.  Invalid input(s): "CK" ------------------------------------------------------------------------------------------------------------------    Component Value Date/Time   BNP 85.0 11/24/2022 1356     ---------------------------------------------------------------------------------------------------------------  Urinalysis    Component Value Date/Time   COLORURINE STRAW (A) 11/24/2022 1510   APPEARANCEUR CLEAR 11/24/2022 1510   LABSPEC 1.006 11/24/2022 1510   PHURINE 5.0 11/24/2022 1510   GLUCOSEU 50 (A) 11/24/2022 1510   HGBUR SMALL (A) 11/24/2022 1510   BILIRUBINUR NEGATIVE 11/24/2022 1510   KETONESUR NEGATIVE 11/24/2022 1510   PROTEINUR NEGATIVE 11/24/2022 1510   UROBILINOGEN 0.2 12/14/2012 2154   NITRITE NEGATIVE 11/24/2022 1510   LEUKOCYTESUR NEGATIVE 11/24/2022 1510    ----------------------------------------------------------------------------------------------------------------   Imaging Results:    DG Chest Portable 1 View  Addendum Date: 11/24/2022   ADDENDUM REPORT: 11/24/2022 16:02 ADDENDUM: Voice recognition error identified in the impression section of the report. The impression should read as follows IMPRESSION: IMPRESSION Basilar atelectasis.  Otherwise no acute cardiopulmonary process. Electronically Signed   By: Kennith Center M.D.   On: 11/24/2022 16:02   Result Date: 11/24/2022 CLINICAL  DATA:  Leg swelling. EXAM: PORTABLE CHEST 1 VIEW COMPARISON:  08/23/2022 FINDINGS: Low volume film. Cardiopericardial silhouette is at upper limits of normal for size. Basilar atelectasis. No edema or focal consolidation. No substantial pleural effusion. Telemetry leads overlie the chest. IMPRESSION: Fall Electronically Signed: By: Kennith Center M.D. On: 11/24/2022 15:00  EKG: Vent. rate 83 BPM PR interval 151 ms QRS duration 100 ms QT/QTcB 390/459 ms P-R-T axes 32 53 22 Sinus rhythm Low voltage, extremity leads  Assessment & Plan:    Principal Problem:   Anasarca Active Problems:   Morbid obesity (HCC)   Tobacco abuse   Essential hypertension   Alcohol abuse   Diabetic neuropathy (HCC)   GERD (gastroesophageal reflux disease)   Anxiety and depression   Cirrhosis (HCC)   COPD (chronic obstructive pulmonary disease) (HCC)   Sleep apnea   Hypoalbuminemia   Chronic inflammatory demyelinating polyradiculoneuropathy (HCC)   Anasarca Decompensated alcoholic liver cirrhosis Hypoalbuminemia -She presents with significant volume overload, swelling up to scrotal area, 10 pounds weight gain over the last week, getting his Bumex as he ran out without knowing. -Significantly low albumin level, will start on IV albumin -Start on IV Lasix 40 mg IV twice daily, continue with home dose Aldactone -Will check ammonia level -2 g salt diet -No ascites appreciated on physical exam  Alcohol abuse -He was counseled at length, will start on CIWA protocol, thiamine and folic acid  Tobacco abuse -he was counseled, will start on nicotine patch   Anemia of chronic disease -will check anemia panel, B12 significantly low 5 years ago  Hypomagnesemia -Repleted  Gout - continue with  Allopurinol  DM wtih neuropathy - will check A1C -Will lower gabapentin for now -Continue with Lantus and insulin sliding scale  HLD - resume statin when stable  Chronic diastolic CHF -BNP within normal  limit, no volume overload on chest x-ray, his anasarca/volume overload related to liver disease not CHF this admission  CIPD?  Generalized weakness Deconditioning - Previously had IVIG/Panzyga treatments but couldn't afford and transport was an issue -Will consult PT/OT  BPH:  -Flomax   AM Labs Ordered, also please review Full Orders  Family Communication: Admission, patients condition and plan of care including tests being ordered have been discussed with the patient and fianc at bedside* who indicate understanding and agree with the plan and Code Status.  Code Status full code  Likely DC to home  Condition GUARDED  Consults called: None  Admission status: Inpatient  Time spent in minutes : 75 minutes   Huey Bienenstock M.D on 11/24/2022 at 6:00 PM   [facility] - Office  [phone]

## 2022-11-24 NOTE — ED Triage Notes (Signed)
Iron infusions last Friday and yesterday  Now noticing fluid build up in legs   Edema noted by this RN in B/L legs  Pitting edema to ABD too

## 2022-11-24 NOTE — Progress Notes (Signed)
Received report from ED nurse on pt. Pt arrived on unit by [facility] and ambulated to bed. Stood on scale and obtained weight. VS checked and tele leads placed on pt. IV pole in room with pt. Pt stable at this time.

## 2022-11-24 NOTE — ED Notes (Signed)
Pt using urinal at bedside.

## 2022-11-24 NOTE — ED Notes (Signed)
IP MD at bedside 

## 2022-11-24 NOTE — ED Provider Notes (Signed)
[place] EMERGENCY DEPARTMENT AT [facility] Provider Note   CSN: [id number] Arrival date & time: 11/24/22  1316     History  Chief Complaint  Patient presents with   Leg Swelling    [name] is a 57 y.o. male.  HPI      [name] is a 57 y.o. male with past medical history of type 2 diabetes, hypertension, paroxysmal atrial fibrillation, cirrhosis of the liver, pancreatitis and anemia who presents to the Emergency Department complaining of fluid buildup, weight gain and edema of the bilateral lower extremities.  Feels as his abdomen is swollen as well.  He endorses greater than 10 pound weight gain over the last 2 weeks.  Typically takes Bumex and spironolactone daily.  Ran out of his Bumex for an unknown period of time.  Seen by cardiology yesterday and his Bumex was refilled, but comes to the ER today due to dyspnea on exertion and swelling of his legs.  States his legs feel heavy and he is having difficulty standing or walking due to the heaviness of his legs.  Denies any chest pain.  Had iron infusion last Friday and yesterday    Home Medications Prior to Admission medications   Medication Sig Start Date End Date Taking? Authorizing Provider  acetaminophen (TYLENOL) 500 MG tablet Take 1,000 mg by mouth 2 (two) times daily.     [provider]  allopurinol (ZYLOPRIM) 300 MG tablet Take 300 mg by mouth daily.  01/21/11   [provider]  bumetanide (BUMEX) 2 MG tablet Take 1 tablet (2 mg total) by mouth daily. 11/23/22   Pricilla Riffle, MD  Chlorpheniramine Maleate (ALLERGY RELIEF PO) Take by mouth.    [provider]  ferrous sulfate 325 (65 FE) MG tablet Take 325 mg by mouth daily with breakfast.    [provider]  folic acid (FOLVITE) 1 MG tablet Take 1 tablet (1 mg total) by mouth daily. 08/29/17   Glade Lloyd, MD  gabapentin (NEURONTIN) 300 MG capsule Take 2 capsules by mouth at bedtime. 600 mg 2-3 times per day 10/06/17    [provider]  hydrOXYzine (ATARAX) 25 MG tablet Take 50 mg by mouth 3 (three) times daily. 10/05/22   [provider]  JARDIANCE 25 MG TABS tablet Take 25 mg by mouth daily. 10/05/22   [provider]  LANTUS SOLOSTAR 100 UNIT/ML Solostar Pen SMARTSIG:5 Unit(s) SUB-Q Every Night    [provider]  loperamide (IMODIUM) 2 MG capsule Take by mouth as needed for diarrhea or loose stools.    [provider]  MAGNESIUM-OXIDE 400 (240 Mg) MG tablet Take 2 tablets by mouth daily. 10/05/22   [provider]  melatonin 3 MG TABS tablet Take by mouth.    [provider]  omeprazole (PRILOSEC) 20 MG capsule Take 20 mg by mouth daily.    [provider]  pravastatin (PRAVACHOL) 20 MG tablet Take 20 mg by mouth daily.    [provider]  Simethicone (GAS RELIEF PO) Take by mouth.    [provider]  spironolactone (ALDACTONE) 100 MG tablet Take by mouth daily.    [provider]  tamsulosin (FLOMAX) 0.4 MG CAPS capsule Take 0.4 mg by mouth daily.    [provider]      Allergies    Hydrocodone and Venlafaxine    Review of Systems   Review of Systems  Constitutional:  Negative for appetite change, chills  and fever.  Respiratory:  Positive for shortness of breath. Negative for cough.   Cardiovascular:  Positive for leg swelling. Negative for chest pain.  Gastrointestinal:  Negative for abdominal pain, diarrhea, nausea and vomiting.  Musculoskeletal:  Negative for arthralgias and back pain.  Skin:  Negative for color change and wound.  Neurological:  Negative for dizziness, weakness and numbness.    Physical Exam Updated Vital Signs BP 126/83 (BP Location: Left Arm)   Pulse 82   Temp 98.3 F (36.8 C) (Oral)   Resp 15   Ht 5\' 11"  (1.803 m)   Wt 117.9 kg   SpO2 93%   BMI 36.26 kg/m  Physical Exam Vitals and nursing note reviewed.  Constitutional:      General: He is not in acute  distress.    Appearance: He is not toxic-appearing.  Eyes:     Extraocular Movements: Extraocular movements intact.     Conjunctiva/sclera: Conjunctivae normal.     Pupils: Pupils are equal, round, and reactive to light.  Cardiovascular:     Rate and Rhythm: Normal rate and regular rhythm.     Pulses: Normal pulses.  Pulmonary:     Effort: Pulmonary effort is normal.  Abdominal:     Palpations: Abdomen is soft.     Tenderness: There is no abdominal tenderness.  Musculoskeletal:     Right lower leg: Edema present.     Left lower leg: Edema present.     Comments: 1+ pitting edema the bilateral lower extremities  Skin:    General: Skin is warm.     Capillary Refill: Capillary refill takes less than 2 seconds.  Neurological:     General: No focal deficit present.     Mental Status: He is alert.     ED Results / Procedures / Treatments   Labs (all labs ordered are listed, but only abnormal results are displayed) Labs Reviewed  CBC WITH DIFFERENTIAL/PLATELET - Abnormal; Notable for the following components:      Result Value   RBC 3.40 (*)    Hemoglobin 9.2 (*)    HCT 31.1 (*)    MCHC 29.6 (*)    RDW 21.9 (*)    All other components within normal limits  COMPREHENSIVE METABOLIC PANEL - Abnormal; Notable for the following components:   Glucose, Bld 125 (*)    Calcium 7.8 (*)    Total Protein 6.3 (*)    Albumin 1.6 (*)    Anion gap 4 (*)    All other components within normal limits  URINALYSIS, ROUTINE W REFLEX MICROSCOPIC - Abnormal; Notable for the following components:   Color, Urine STRAW (*)    Glucose, UA 50 (*)    Hgb urine dipstick SMALL (*)    Bacteria, UA RARE (*)    All other components within normal limits  MAGNESIUM - Abnormal; Notable for the following components:   Magnesium 1.5 (*)    All other components within normal limits  CK  BRAIN NATRIURETIC PEPTIDE  ETHANOL  TROPONIN I (HIGH SENSITIVITY)  TROPONIN I (HIGH SENSITIVITY)    EKG EKG  Interpretation Date/Time:  Saturday November 24 2022 13:44:34 EDT Ventricular Rate:  83 PR Interval:  151 QRS Duration:  100 QT Interval:  390 QTC Calculation: 459 R Axis:   53  Text Interpretation: Sinus rhythm Low voltage, extremity leads Confirmed by Vanetta Mulders [id number]) on 11/24/2022 2:58:44 PM  Radiology DG Chest Portable 1 View  Addendum Date: 11/24/2022   ADDENDUM REPORT: 11/24/2022  16:02 ADDENDUM: Voice recognition error identified in the impression section of the report. The impression should read as follows IMPRESSION: IMPRESSION Basilar atelectasis.  Otherwise no acute cardiopulmonary process. Electronically Signed   By: Kennith Center M.D.   On: 11/24/2022 16:02   Result Date: 11/24/2022 CLINICAL DATA:  Leg swelling. EXAM: PORTABLE CHEST 1 VIEW COMPARISON:  08/23/2022 FINDINGS: Low volume film. Cardiopericardial silhouette is at upper limits of normal for size. Basilar atelectasis. No edema or focal consolidation. No substantial pleural effusion. Telemetry leads overlie the chest. IMPRESSION: Fall Electronically Signed: By: Kennith Center M.D. On: 11/24/2022 15:00    Procedures Procedures    Medications Ordered in ED Medications  furosemide (LASIX) injection 40 mg (has no administration in time range)    ED Course/ Medical Decision Making/ A&P                                 Medical Decision Making Patient here for dyspnea on exertion, edema bilateral lower extremities.  Has history of COPD, cirrhosis and likely CHF.  Takes Bumex daily and has been out of this medication for an unknown period of time.  He endorses 10 pound weight gain over the last week to 2 weeks.  Seen by cardiology yesterday Bumex was refilled but continued to have worsening symptoms today which prompted his ER visit.     Vital signs are reassuring, patient looks volume overloaded.  1+ pitting edema bilateral lower extremities.  Differential includes but not limited to CHF, anasarca, hypovolemia  secondary to cirrhosis   Amount and/or Complexity of Data Reviewed Labs: ordered.    Details: Labs interpreted by me, no evidence of leukocytosis,Hemoglobin improved to 9.2 today.  It was 8.1 two months ago.  BNP 85.  Chemistries without significant derangement.  Troponin 6.  Magnesium low at 1.5 urinalysis without evidence of infection Radiology: ordered.    Details: Chest x-ray shows basilar atelectasis ECG/medicine tests: ordered.    Details: EKG shows sinus rhythm Discussion of management or test interpretation with external provider(s): Patient with suspected CHF exacerbation.  Seen by cardiology yesterday in clinic, echocardiogram ordered.  Appears volume overloaded on exam.  Will diurese here and patient agreeable to admission.  Discussed with [facility] hospitalist, Dr. Randol Kern who saw pt in ER and agrees to admit.    Risk Prescription drug management. Decision regarding hospitalization.           Final Clinical Impression(s) / ED Diagnoses Final diagnoses:  Anasarca    Rx / DC Orders ED Discharge Orders     None         Pauline Aus, PA-C 11/24/22 1807    Bethann Berkshire, MD 11/26/22 1225

## 2022-11-25 DIAGNOSIS — F1010 Alcohol abuse, uncomplicated: Secondary | ICD-10-CM | POA: Diagnosis not present

## 2022-11-25 DIAGNOSIS — I10 Essential (primary) hypertension: Secondary | ICD-10-CM

## 2022-11-25 DIAGNOSIS — K219 Gastro-esophageal reflux disease without esophagitis: Secondary | ICD-10-CM

## 2022-11-25 DIAGNOSIS — R601 Generalized edema: Secondary | ICD-10-CM

## 2022-11-25 DIAGNOSIS — K7030 Alcoholic cirrhosis of liver without ascites: Secondary | ICD-10-CM

## 2022-11-25 DIAGNOSIS — E8809 Other disorders of plasma-protein metabolism, not elsewhere classified: Secondary | ICD-10-CM

## 2022-11-25 DIAGNOSIS — Z720 Tobacco use: Secondary | ICD-10-CM

## 2022-11-25 DIAGNOSIS — E1149 Type 2 diabetes mellitus with other diabetic neurological complication: Secondary | ICD-10-CM

## 2022-11-25 LAB — COMPREHENSIVE METABOLIC PANEL WITH GFR
ALT: 12 U/L (ref 0–44)
AST: 16 U/L (ref 15–41)
Albumin: 1.7 g/dL — ABNORMAL LOW (ref 3.5–5.0)
Alkaline Phosphatase: 92 U/L (ref 38–126)
Anion gap: 7 (ref 5–15)
BUN: 12 mg/dL (ref 6–20)
CO2: 26 mmol/L (ref 22–32)
Calcium: 7.5 mg/dL — ABNORMAL LOW (ref 8.9–10.3)
Chloride: 103 mmol/L (ref 98–111)
Creatinine, Ser: 0.89 mg/dL (ref 0.61–1.24)
GFR, Estimated: >60 mL/min (ref >60)
Glucose, Bld: 111 mg/dL — ABNORMAL HIGH (ref 70–99)
Potassium: 3.5 mmol/L (ref 3.5–5.1)
Sodium: 136 mmol/L (ref 135–145)
Total Bilirubin: 0.7 mg/dL (ref 0.3–1.2)
Total Protein: 5.5 g/dL — ABNORMAL LOW (ref 6.5–8.1)

## 2022-11-25 LAB — GLUCOSE, CAPILLARY
Glucose-Capillary: 91 mg/dL (ref 70–99)
Glucose-Capillary: 148 mg/dL — ABNORMAL HIGH (ref 70–99)
Glucose-Capillary: 137 mg/dL — ABNORMAL HIGH (ref 70–99)
Glucose-Capillary: 190 mg/dL — ABNORMAL HIGH (ref 70–99)

## 2022-11-25 LAB — CBC
HCT: 27 % — ABNORMAL LOW (ref 39.0–52.0)
Hemoglobin: 7.8 g/dL — ABNORMAL LOW (ref 13.0–17.0)
MCH: 26.4 pg (ref 26.0–34.0)
MCHC: 28.9 g/dL — ABNORMAL LOW (ref 30.0–36.0)
MCV: 91.2 fL (ref 80.0–100.0)
Platelets: 136 10*3/uL — ABNORMAL LOW (ref 150–400)
RBC: 2.96 MIL/uL — ABNORMAL LOW (ref 4.22–5.81)
RDW: 21.8 % — ABNORMAL HIGH (ref 11.5–15.5)
WBC: 7.4 10*3/uL (ref 4.0–10.5)
nRBC: 0 % (ref 0.0–0.2)

## 2022-11-25 LAB — HEMOGLOBIN A1C
Hgb A1c MFr Bld: 7 % — ABNORMAL HIGH (ref 4.8–5.6)
Mean Plasma Glucose: 154.2 mg/dL

## 2022-11-25 LAB — IRON AND TIBC
Iron: 182 ug/dL (ref 45–182)
Saturation Ratios: 99 % — ABNORMAL HIGH (ref 17.9–39.5)
TIBC: 183 ug/dL — ABNORMAL LOW (ref 250–450)
UIBC: 1 ug/dL

## 2022-11-25 LAB — AMMONIA: Ammonia: 42 umol/L — ABNORMAL HIGH (ref 9–35)

## 2022-11-25 LAB — HIV ANTIBODY (ROUTINE TESTING W REFLEX): HIV Screen 4th Generation wRfx: NONREACTIVE

## 2022-11-25 LAB — FERRITIN: Ferritin: 155 ng/mL (ref 24–336)

## 2022-11-25 LAB — RETICULOCYTES
Immature Retic Fract: 29.8 % — ABNORMAL HIGH (ref 2.3–15.9)
RBC.: 3 MIL/uL — ABNORMAL LOW (ref 4.22–5.81)
Retic Count, Absolute: 105.9 10*3/uL (ref 19.0–186.0)
Retic Ct Pct: 3.5 % — ABNORMAL HIGH (ref 0.4–3.1)

## 2022-11-25 LAB — VITAMIN B12: Vitamin B-12: 496 pg/mL (ref 180–914)

## 2022-11-25 LAB — FOLATE: Folate: 39.9 ng/mL (ref >5.9)

## 2022-11-25 MED ORDER — METOLAZONE 5 MG PO TABS
2.5000 mg | ORAL_TABLET | Freq: Every day | ORAL | Status: AC
  Administered 2022-11-25 – 2022-11-26 (×2): 2.5 mg via ORAL
  Filled 2022-11-25 (×2): qty 1

## 2022-11-25 MED ORDER — LOPERAMIDE HCL 2 MG PO CAPS
2.0000 mg | ORAL_CAPSULE | Freq: Four times a day (QID) | ORAL | Status: DC | PRN
  Administered 2022-11-27 – 2022-12-01 (×6): 2 mg via ORAL
  Filled 2022-11-25 (×5): qty 1

## 2022-11-25 NOTE — Progress Notes (Signed)
   11/25/22 1604  TOC Brief Assessment  Insurance and Status Reviewed  Patient has primary care physician Yes  Home environment has been reviewed Home with spouse  Prior level of function: Needs assistance  Prior/Current Home Services No current home services  Social Determinants of Health Reivew SDOH reviewed no interventions necessary  Readmission risk has been reviewed Yes  Transition of care needs no transition of care needs at this time   Substance abuse resources added to AVS. PT eval pending, TOC following to discuss with patient.

## 2022-11-25 NOTE — Progress Notes (Signed)
Pt has had good day. Has been up in recliner most of day, ambulated to bathroom x3 today, small BM x2. Pt has asked for Immodium because he is afraid he will develop diarrhea, pt spoke with MD and orders received. Urine output >1500 ml today. Has tolerated meals well without n/v. CIWA score 2, no s/s ETOH withdrawals. Pt A&O, cooperative, talkative and appropriate.

## 2022-11-25 NOTE — Progress Notes (Signed)
Pt assisted up to bathroom for BM, only passed flatus. Pt then assisted back to recliner with feet elevated. Pt tolerated ambulation fair, moves very slow and is unsteady on feel due to edema and pt states decreased feeling in his feet from neuropathy. Male-wick remains on for accurate urine output monitoring due to pt incontinence. Pt with no c/o pain at present. Chair alarm on for safety, call bell within reach, advised to call for needs. Pt states understanding.

## 2022-11-25 NOTE — Progress Notes (Signed)
Progress Note   Patient: [name] ZOX:[id number] DOB: Dec 23, 1965 DOA: 11/24/2022     1 DOS: the patient was seen and examined on 11/25/2022   Brief hospital admission narrative:  [name]  is a 57 y.o. male, with a past medical history of HTN, pulm HTN, HFpEF, peripheral neuropathy, tobacco use, alcohol abuse, cirrhosis, COPD, anxiety, depression, gout, back pain, T2DM, GERD, alcoholic pancreatitis at [facility] February of this year . -Patient presents to ED secondary to worsening edema and anasarca, still drinking alcohol, reports he cut back significantly down to 3 beers per day, patient report 10 pound weight gain over the last 10 days, he is on Bumex and Aldactone daily, history was obtained from fianc at bedside who is assisting with the history, currently patient has been out for Bumex for some time now, as he has been taking his meds from the pillbox without the Bumex, this is for unknown period of time, he was seen by cardiology yesterday, where his Bumex was refilled, he comes to ER today due to dyspnea on exertion and swelling of his legs, as well he reports generalized weakness and deconditioning, he is with chronic anemia, had iron infusion last Friday and Saturday ED workup significant for albumin of 1.6, magnesium of 1.5, anemia with hemoglobin of 9.2, platelets stable at 156 K's, LFTs within normal limit, he had extensive anasarca, edema up to lower abdomen, will need IV diuresis, TRH consulted to admit for fluid overload/anasarca.  Assessment and Plan: 1-anasarca/decompensated alcoholic liver cirrhosis and hypoalbuminemia -Continue IV diuresis -Metolazone will be added -Patient has received IV albumin -Follow daily weights, strict I's and O's and low-sodium diet -Closely follow patient's electrolytes and replete as needed.  2-alcohol abuse -Cessation counseling provided -Continue CIWA protocol monitoring -No signs of acute withdrawal -Thiamine and folic acid  ordered.  3-tobacco abuse -Cessation counseling provided -Continue nicotine patch.  4-hypomagnesemia -Repleted -Continue to follow ultralights trend  5-history of gout -No acute flare currently appreciated -Continue allopurinol.  6-hyperlipidemia -Continue statin.  7-type 2 diabetes mellitus with neuropathy -Continue treatment with gabapentin -Continue Lantus and sliding scale -Follow CBGs and adjust hypoglycemic regimen as needed -A1c 7.0  8-BPH -Continue Flomax.  9-generalized weakness -Follow recommendation by PT/OT  10-diarrhea -In the setting of short gut syndrome from previous surgical intervention -Continue as needed loperamide. -Patient advised to maintain adequate hydration.  11-class II obesity -Body mass index is 36.31 kg/m. -Low-calorie diet and portion control discussed with patient.   Subjective:  No chest pain, no nausea or vomiting; still with signs of fluid overload.  Patient reports no fever.  Experiencing significant diarrhea.  Physical Exam: Vitals:   11/25/22 0919 11/25/22 0937 11/25/22 1001 11/25/22 1224  BP: 121/71   122/73  Pulse: (!) 103   82  Resp: 18   14  Temp:    98.2 F (36.8 C)  TempSrc:    Oral  SpO2: 94% 92%  98%  Weight:   118.1 kg   Height:       General exam: Alert, awake, oriented x 3; reports feeling better.  Good urine output reported.  No chest pain. Respiratory system: Good air movement bilaterally; no using accessory muscle.  Good saturation on room air. Cardiovascular system:RRR. No murmurs, rubs, gallops. Gastrointestinal system: Abdomen is obese, nondistended, soft and nontender.  Positive bowel sounds; patient reported increased abdominal girth. Central nervous system: Alert and oriented. No focal neurological deficits. Extremities: No cyanosis or clubbing; 2+ edema appreciated bilaterally. Skin: No petechiae.  Psychiatry: Judgement and insight appear normal. Mood & affect appropriate.   Data  Reviewed:  Family Communication: No family at bedside.  Disposition: Status is: Inpatient Remains inpatient appropriate because: Continue IV diuresis.   Planned Discharge Destination: Home  Time spent: 40 minutes  Author: Vassie Loll, MD 11/25/2022 4:07 PM  For on call review [email or web address].

## 2022-11-25 NOTE — Plan of Care (Signed)

## 2022-11-26 DIAGNOSIS — R601 Generalized edema: Secondary | ICD-10-CM | POA: Diagnosis not present

## 2022-11-26 DIAGNOSIS — F1010 Alcohol abuse, uncomplicated: Secondary | ICD-10-CM | POA: Diagnosis not present

## 2022-11-26 DIAGNOSIS — K219 Gastro-esophageal reflux disease without esophagitis: Secondary | ICD-10-CM | POA: Diagnosis not present

## 2022-11-26 LAB — GLUCOSE, CAPILLARY
Glucose-Capillary: 84 mg/dL (ref 70–99)
Glucose-Capillary: 118 mg/dL — ABNORMAL HIGH (ref 70–99)
Glucose-Capillary: 139 mg/dL — ABNORMAL HIGH (ref 70–99)
Glucose-Capillary: 162 mg/dL — ABNORMAL HIGH (ref 70–99)

## 2022-11-26 MED ORDER — MAGNESIUM SULFATE IN D5W 1-5 GM/100ML-% IV SOLN
1.0000 g | Freq: Once | INTRAVENOUS | Status: AC
  Administered 2022-11-26: 1 g via INTRAVENOUS
  Filled 2022-11-26: qty 100

## 2022-11-26 MED ORDER — METOPROLOL TARTRATE 5 MG/5ML IV SOLN
5.0000 mg | Freq: Once | INTRAVENOUS | Status: AC
  Administered 2022-11-26: 5 mg via INTRAVENOUS
  Filled 2022-11-26: qty 5

## 2022-11-26 MED ORDER — NYSTATIN 100000 UNIT/GM EX POWD
Freq: Three times a day (TID) | CUTANEOUS | Status: DC
  Administered 2022-11-26 (×2): 1 via TOPICAL
  Filled 2022-11-26: qty 15

## 2022-11-26 NOTE — Evaluation (Signed)
Physical Therapy Evaluation Patient Details Name: [name] MRN: [id number] DOB: 06-13-1965 Today's Date: 11/26/2022  History of Present Illness  [name]  is a 57 y.o. male, with a past medical history of HTN, pulm HTN, HFpEF, peripheral neuropathy, tobacco use, alcohol abuse, cirrhosis, COPD, anxiety, depression, gout, back pain, T2DM, GERD, alcoholic pancreatitis at [facility] February of this year .  -Patient presents to ED secondary to worsening edema and anasarca, still drinking alcohol, reports he cut back significantly down to 3 beers per day, patient report 10 pound weight gain over the last 10 days, he is on Bumex and Aldactone daily, history was obtained from fianc at bedside who is assisting with the history, currently patient has been out for Bumex for some time now, as he has been taking his meds from the pillbox without the Bumex, this is for unknown period of time, he was seen by cardiology yesterday, where his Bumex was refilled, he comes to ER today due to dyspnea on exertion and swelling of his legs, as well he reports generalized weakness and deconditioning, he is with chronic anemia, had iron infusion last Friday and Saturday ED workup significant for albumin of 1.6, magnesium of 1.5, anemia with hemoglobin of 9.2, platelets stable at 156 K's, LFTs within normal limit, he had extensive anasarca, edema up to lower abdomen, will need IV diuresis, Sot[facility] hospitalist consulted to admit   Clinical Impression  Patient demonstrates labored movement for sitting up at bedside requiring assistance for moving legs, able to transfer without AD, but has to lean on nearby objects for support, safer using RW with good return for ambulating in room/hallway without loss of balance.  Patient tolerated sitting up in chair after therapy.  Patient will benefit from continued skilled physical therapy in hospital and recommended venue below to increase strength, balance, endurance for safe ADLs and  gait.          If plan is discharge home, recommend the following: A little help with walking and/or transfers;A little help with bathing/dressing/bathroom;Help with stairs or ramp for entrance;Assistance with cooking/housework   Can travel by private vehicle        Equipment Recommendations None recommended by PT  Recommendations for Other Services       Functional Status Assessment Patient has had a recent decline in their functional status and demonstrates the ability to make significant improvements in function in a reasonable and predictable amount of time.     Precautions / Restrictions Precautions Precautions: Fall Restrictions Weight Bearing Restrictions: No      Mobility  Bed Mobility Overal bed mobility: Needs Assistance Bed Mobility: Sit to Supine, Supine to Sit     Supine to sit: Min assist, HOB elevated Sit to supine: Min assist, HOB elevated   General bed mobility comments: required assistance for moving legs and HOB raised    Transfers Overall transfer level: Needs assistance Equipment used: Rolling walker (2 wheels) Transfers: Sit to/from Stand, Bed to chair/wheelchair/BSC Sit to Stand: Supervision   Step pivot transfers: Supervision, Min guard       General transfer comment: labored movement having to lean on armrest of chair during transfers without AD    Ambulation/Gait Ambulation/Gait assistance: Supervision, Min guard Gait Distance (Feet): 75 Feet Assistive device: Rolling walker (2 wheels) Gait Pattern/deviations: Decreased step length - right, Decreased step length - left, Decreased stride length Gait velocity: decreased     General Gait Details: slightly labored cadence without loss of balance ambulating in room/hallway  using RW  Stairs            Wheelchair Mobility     Tilt Bed    Modified Rankin (Stroke Patients Only)       Balance Overall balance assessment: Needs assistance Sitting-balance support: Feet  supported, No upper extremity supported Sitting balance-Leahy Scale: Good Sitting balance - Comments: seated at EOB   Standing balance support: During functional activity, No upper extremity supported Standing balance-Leahy Scale: Fair Standing balance comment: fair/good using RW                             Pertinent Vitals/Pain Pain Assessment Pain Assessment: No/denies pain    Home Living Family/patient expects to be discharged to:: Private residence Living Arrangements: Spouse/significant other Available Help at Discharge: Family;Available PRN/intermittently Type of Home: House Home Access: Stairs to enter Entrance Stairs-Rails: Left Entrance Stairs-Number of Steps: 1   Home Layout: One level Home Equipment: Educational psychologist (2 wheels);Rollator (4 wheels);Wheelchair - IT trainer;Other (comment) Additional Comments: has side rails in hallways at home    Prior Function Prior Level of Function : Independent/Modified Independent;Needs assist       Physical Assist : Mobility (physical);ADLs (physical) Mobility (physical): Bed mobility;Transfers;Gait;Stairs   Mobility Comments: houshold ambulator using side rails in hallways, uses SPC or electric scooter for longer distances out side on uneven surfaces, ADLs Comments: assisted by family     Hand Dominance        Extremity/Trunk Assessment   Upper Extremity Assessment Upper Extremity Assessment: Defer to OT evaluation    Lower Extremity Assessment Lower Extremity Assessment: Generalized weakness    Cervical / Trunk Assessment Cervical / Trunk Assessment: Normal  Communication   Communication: No difficulties  Cognition Arousal/Alertness: Awake/alert Behavior During Therapy: WFL for tasks assessed/performed Overall Cognitive Status: Within Functional Limits for tasks assessed                                          General Comments      Exercises      Assessment/Plan    PT Assessment Patient needs continued PT services  PT Problem List Decreased strength;Decreased activity tolerance;Decreased balance;Decreased mobility       PT Treatment Interventions DME instruction;Gait training;Stair training;Functional mobility training;Therapeutic activities;Therapeutic exercise;Patient/family education;Balance training    PT Goals (Current goals can be found in the Care Plan section)  Acute Rehab PT Goals Patient Stated Goal: return home with family to assist PT Goal Formulation: With patient Time For Goal Achievement: 11/30/22 Potential to Achieve Goals: Good    Frequency Min 3X/week     Co-evaluation               AM-PAC PT "6 Clicks" Mobility  Outcome Measure Help needed turning from your back to your side while in a flat bed without using bedrails?: None Help needed moving from lying on your back to sitting on the side of a flat bed without using bedrails?: A Little Help needed moving to and from a bed to a chair (including a wheelchair)?: A Little Help needed standing up from a chair using your arms (e.g., wheelchair or bedside chair)?: A Little Help needed to walk in hospital room?: A Little Help needed climbing 3-5 steps with a railing? : A Lot 6 Click Score: 18    End of Session  Activity Tolerance: Patient tolerated treatment well;Patient limited by fatigue Patient left: in bed Nurse Communication: Mobility status PT Visit Diagnosis: Unsteadiness on feet (R26.81);Other abnormalities of gait and mobility (R26.89);Muscle weakness (generalized) (M62.81)    Time: 1020-1036 PT Time Calculation (min) (ACUTE ONLY): 16 min   Charges:   PT Evaluation $PT Eval Low Complexity: 1 Low PT Treatments $Therapeutic Activity: 8-22 mins PT General Charges $$ ACUTE PT VISIT: 1 Visit         2:38 PM, 11/26/22 Ocie Bob, MPT Physical Therapist with [facility] 336 [phone] office [phone] mobile  phone

## 2022-11-26 NOTE — Plan of Care (Signed)
  Problem: Acute Rehab PT Goals(only PT should resolve) Goal: Pt Will Go Supine/Side To Sit Outcome: Progressing Flowsheets (Taken 11/26/2022 1439) Pt will go Supine/Side to Sit: with supervision Goal: Patient Will Transfer Sit To/From Stand Outcome: Progressing Flowsheets (Taken 11/26/2022 1439) Patient will transfer sit to/from stand:  with modified independence  with supervision Goal: Pt Will Transfer Bed To Chair/Chair To Bed Outcome: Progressing Flowsheets (Taken 11/26/2022 1439) Pt will Transfer Bed to Chair/Chair to Bed:  with modified independence  with supervision Goal: Pt Will Ambulate Outcome: Progressing Flowsheets (Taken 11/26/2022 1439) Pt will Ambulate:  100 feet  with modified independence  with supervision  with rolling walker   2:40 PM, 11/26/22 Ocie Bob, MPT Physical Therapist with [facility] [phone] office [phone] mobile phone

## 2022-11-26 NOTE — Plan of Care (Signed)
  Problem: Health Behavior/Discharge Planning: Goal: Ability to manage health-related needs will improve Outcome: Progressing   Problem: Health Behavior/Discharge Planning: Goal: Ability to manage health-related needs will improve Outcome: Progressing   Problem: Clinical Measurements: Goal: Respiratory complications will improve Outcome: Progressing Goal: Cardiovascular complication will be avoided Outcome: Progressing   Problem: Activity: Goal: Risk for activity intolerance will decrease Outcome: Progressing

## 2022-11-26 NOTE — Progress Notes (Signed)
   11/26/22 0846  Vitals  BP (!) 174/117  MAP (mmHg) 135  BP Location Left Arm  BP Method Automatic  Patient Position (if appropriate) Sitting  Pulse Rate (!) 154  Pulse Rate Source Dinamap  Resp 20  Level of Consciousness  Level of Consciousness Alert  MEWS COLOR  MEWS Score Color Yellow  Oxygen Therapy  SpO2 97 %  O2 Device Room Air  Pain Assessment  Pain Scale 0-10  Pain Score 0  MEWS Score  MEWS Temp 0  MEWS Systolic 0  MEWS Pulse 3  MEWS RR 0  MEWS LOC 0  MEWS Score 3   Reviewing tele monitor and noted pt's HR reading afib in 145-155/in. In to room to find pt sitting on side of bed eating breakfast. Pt denies any c/o pain, palpitations or SOB. Skin warm and dry, resp even and non-labored. VSS other than HR 150's. EKG obtained, shows afib w/ RVR. MD [facility] notified.

## 2022-11-26 NOTE — Progress Notes (Signed)
Diagnosis: Iron Deficiency Anemia  Provider:  Dwana Melena MD  Procedure: IV Infusion  IV Type: Peripheral, IV Location: L Antecubital  Feraheme (Ferumoxytol), Dose: 510 mg  Infusion Start Time: 1045  Infusion Stop Time: 1105  Post Infusion IV Care: Observation period completed and Peripheral IV Discontinued  Discharge: Condition: Good, Destination: Home . AVS Provided  Performed by:  Marin Shutter, RN

## 2022-11-26 NOTE — Progress Notes (Signed)
Notified by NT that pt has blood tinged urine noted in suction canister and that pt seems to have bright red blood noted from end of penis. Malewick removed and small clot noted in end of urinary meatus. Pt does have active bright red bleeding from urinary meatus but denies any burning, dysuria or other discomfort. Head of penis noted to have small area of redness but no bleeding. Anterior scrotum has open areas with yellow drainage noted, pt states itches and burns when touched. Pericare performed, pt noted to have strong yeasty smell in bilateral groin with associated redness and some denuded skin. Condom cath applied to avoid irritation of head of penis and scrotum, standard drainage bag attached for urine collection. MD [facility] notified of above and of request for nystatin powder to yeast affected areas.

## 2022-11-26 NOTE — TOC Progression Note (Signed)
Transition of Care [facility]) - Progression Note    Patient Details  Name: [name] MRN: [id number] Date of Birth: [birth year]  Transition of Care [facility]) CM/SW Contact  Leitha Bleak, RN Phone Number: 11/26/2022, 2:45 PM  Clinical Narrative:   PT is recommending HHPT. CM spoke with patient. He is feeling better and does not want HH or outpatient PT at this time. He plans to move more at home. CM discussed the SA resources as well. He has not been at any meetings. He will look over the information but has no plan to go to meetings. He will follow up with his PCP if he has further needs.    Expected Discharge Plan: Home/Self Care Barriers to Discharge: Continued Medical Work up  Expected Discharge Plan and Services      Social Determinants of Health (SDOH) Interventions SDOH Screenings   Food Insecurity: No Food Insecurity (11/24/2022)  Housing: Low Risk  (11/24/2022)  Transportation Needs: No Transportation Needs (11/24/2022)  Utilities: Not At Risk (11/24/2022)  Financial Resource Strain: Low Risk  (06/17/2022)   Received from [facility], [facility]  Physical Activity: Inactive (05/31/2021)   Received from [facility], [facility]  Social Connections: Moderately Isolated (05/31/2021)   Received from [facility], [facility]  Stress: No Stress Concern Present (05/31/2021)   Received from [facility], [facility]  Tobacco Use: High Risk (11/24/2022)  Health Literacy: Low Risk  (05/31/2021)   Received from [facility], [facility]    Readmission Risk Interventions    11/26/2022    2:45 PM  Readmission Risk Prevention Plan  Transportation Screening Complete  PCP or Specialist Appt within 5-7 Days Not Complete  Home Care Screening Complete  Medication Review (RN CM) Complete

## 2022-11-26 NOTE — Progress Notes (Signed)
Patient given Metoprolol 5mg  IV , patients HR returned to in the 80's , patient denies dizziness or shortness of breath , sitting up on the side of the bed eating at this time.    11/26/22 0907  Assess: MEWS Score  BP 119/69  Pulse Rate 89  Assess: MEWS Score  MEWS Temp 0  MEWS Systolic 0  MEWS Pulse 0  MEWS RR 0  MEWS LOC 0  MEWS Score 0  MEWS Score Color Green  Assess: SIRS CRITERIA  SIRS Temperature  0  SIRS Pulse 0  SIRS Respirations  0  SIRS WBC 0  SIRS Score Sum  0

## 2022-11-26 NOTE — Progress Notes (Signed)
Progress Note   Patient: [name] ZOX:[id number] DOB: 05/31/1965 DOA: 11/24/2022     2 DOS: the patient was seen and examined on 11/26/2022   Brief hospital admission narrative:  [name]  is a 57 y.o. male, with a past medical history of HTN, pulm HTN, HFpEF, peripheral neuropathy, tobacco use, alcohol abuse, cirrhosis, COPD, anxiety, depression, gout, back pain, T2DM, GERD, alcoholic pancreatitis at [facility] February of this year . -Patient presents to ED secondary to worsening edema and anasarca, still drinking alcohol, reports he cut back significantly down to 3 beers per day, patient report 10 pound weight gain over the last 10 days, he is on Bumex and Aldactone daily, history was obtained from fianc at bedside who is assisting with the history, currently patient has been out for Bumex for some time now, as he has been taking his meds from the pillbox without the Bumex, this is for unknown period of time, he was seen by cardiology yesterday, where his Bumex was refilled, he comes to ER today due to dyspnea on exertion and swelling of his legs, as well he reports generalized weakness and deconditioning, he is with chronic anemia, had iron infusion last Friday and Saturday ED workup significant for albumin of 1.6, magnesium of 1.5, anemia with hemoglobin of 9.2, platelets stable at 156 K's, LFTs within normal limit, he had extensive anasarca, edema up to lower abdomen, will need IV diuresis, TRH consulted to admit for fluid overload/anasarca.  Assessment and Plan: 1-anasarca/decompensated alcoholic liver cirrhosis and hypoalbuminemia -Continue IV diuresis -Metolazone will be added -Patient has received IV albumin -Follow daily weights, strict I's and O's and low-sodium diet -Closely follow patient's electrolytes and replete as needed.  2-alcohol abuse -Cessation counseling provided -Continue CIWA protocol monitoring -No signs of acute withdrawal -Thiamine and folic acid  ordered.  3-tobacco abuse -Cessation counseling provided -Continue nicotine patch.  4-hypomagnesemia -Magnesium 1.5 on today's blood work -Repleted and will continue daily supplementation. -Continue continue to follow Dr. Lauree Chandler  5-history of gout -No acute flare currently appreciated -Continue allopurinol.  6-hyperlipidemia -Continue statin. -Heart healthy diet discussed with patient.  7-type 2 diabetes mellitus with neuropathy -Continue treatment with gabapentin -Continue Lantus and sliding scale -Follow CBGs and adjust hypoglycemic regimen as needed -A1c 7.0  8-BPH -Continue Flomax.  9-generalized weakness -Follow recommendation by PT/OT  10-diarrhea -In the setting of short gut syndrome from previous surgical intervention -Continue as needed loperamide. -Patient advised to maintain adequate oral hydration.  11-class II obesity -Body mass index is 36.31 kg/m. -Low-calorie diet and portion control discussed with patient.  12-yeast dermatitis -Keep area clean and dry -Treatment with nystatin initiated.  13-history of paroxysmal atrial fibrillation -With transient episode of A-fib with RVR resolved after providing patient with 1 dose of IV Lopressor -Continue telemetry monitoring -Maintain potassium above 4 magnesium above 2 -Patient not on chronic anticoagulation -Continue outpatient follow-up with cardiology service.  14-history of diastolic heart failure -Appears to be compensated on exam-patient reports no shortness of breath, orthopnea or hypoxia -Fluid overload in the setting of liver cirrhosis -Low-sodium diet, daily weights and medication compliance with diuretics has been discussed with patient.   Subjective:  No chest pain, no nausea or vomiting; still with signs of fluid overload.  Patient reports no fever.  Experiencing significant diarrhea.  Physical Exam: Vitals:   11/26/22 0432 11/26/22 0835 11/26/22 0846 11/26/22 0907  BP: 124/71  (!)  174/117 119/69  Pulse: 90  (!) 154 89  Resp: 20  20   Temp: 98.2 F (36.8 C)     TempSrc:      SpO2: 92% 94% 97%   Weight:      Height:       General exam: Alert, awake, oriented x 3; no chest pain, no nausea vomiting.  Overnight with transient episode of A-fib with RVR while eating breakfast.  Reports good urine output. Respiratory system: Clear to auscultation. Respiratory effort normal.  No requiring oxygen due to mentation.  No using accessory muscles. Cardiovascular system: Rate controlled at the end of the evaluation; no rubs or gallops appreciated. Gastrointestinal system: Abdomen is obese, nondistended, soft and nontender. No organomegaly or masses felt. Normal bowel sounds heard. Central nervous system: Alert and oriented. No focal neurological deficits. Extremities: No cyanosis or clubbing.  2-3+ edema appreciated bilaterally. Skin: Yeast dermatitis in his scrotum/groin area has been reported.  No open wounds.  No petechiae. Psychiatry: Judgement and insight appear normal. Mood & affect appropriate.    Data Reviewed: Magnesium: 1.5 Basic metabolic panel: Sodium 136, potassium 3.5, chloride 102, bicarb 29, BUN 12, creatinine 0.92 and GFR >60  Family Communication: No family at bedside.  Disposition: Status is: Inpatient Remains inpatient appropriate because: Continue IV diuresis.   Planned Discharge Destination: Home  Time spent: 40 minutes  Author: Vassie Loll, MD 11/26/2022 5:35 PM  For on call review [email or web address].

## 2022-11-27 DIAGNOSIS — F1010 Alcohol abuse, uncomplicated: Secondary | ICD-10-CM | POA: Diagnosis not present

## 2022-11-27 DIAGNOSIS — K219 Gastro-esophageal reflux disease without esophagitis: Secondary | ICD-10-CM | POA: Diagnosis not present

## 2022-11-27 DIAGNOSIS — R601 Generalized edema: Secondary | ICD-10-CM | POA: Diagnosis not present

## 2022-11-27 LAB — GLUCOSE, CAPILLARY
Glucose-Capillary: 78 mg/dL (ref 70–99)
Glucose-Capillary: 102 mg/dL — ABNORMAL HIGH (ref 70–99)
Glucose-Capillary: 151 mg/dL — ABNORMAL HIGH (ref 70–99)
Glucose-Capillary: 155 mg/dL — ABNORMAL HIGH (ref 70–99)

## 2022-11-27 MED ORDER — MAGNESIUM SULFATE IN D5W 1-5 GM/100ML-% IV SOLN
1.0000 g | Freq: Once | INTRAVENOUS | Status: AC
  Administered 2022-11-27: 1 g via INTRAVENOUS
  Filled 2022-11-27: qty 100

## 2022-11-27 MED ORDER — METOPROLOL TARTRATE 5 MG/5ML IV SOLN
2.5000 mg | Freq: Once | INTRAVENOUS | Status: AC
  Administered 2022-11-27: 2.5 mg via INTRAVENOUS
  Filled 2022-11-27: qty 5

## 2022-11-27 MED ORDER — POTASSIUM CHLORIDE CRYS ER 20 MEQ PO TBCR
20.0000 meq | EXTENDED_RELEASE_TABLET | Freq: Once | ORAL | Status: AC
  Administered 2022-11-27: 20 meq via ORAL
  Filled 2022-11-27: qty 1

## 2022-11-27 NOTE — Progress Notes (Signed)
error 

## 2022-11-27 NOTE — Progress Notes (Signed)
Mobility Specialist Progress Note:    11/27/22 1010  Mobility  Activity Ambulated with assistance in hallway;Transferred from bed to chair  Level of Assistance Moderate assist, patient does 50-74%  Assistive Device Front wheel walker  Distance Ambulated (ft) 210 ft  Range of Motion/Exercises Active;All extremities  Activity Response Tolerated well  Mobility Referral Yes  $Mobility charge 1 Mobility  Mobility Specialist Start Time (ACUTE ONLY) 1010  Mobility Specialist Stop Time (ACUTE ONLY) 1020  Mobility Specialist Time Calculation (min) (ACUTE ONLY) 10 min   Pt was received in bed, agreeable to mobility session. Required ModA +2 to stand, CGA with RW to ambulate. Tolerated well, SpO2 98% in 4L at EOS. Returned pt to room, left in chair. Call bell in reach, all needs met.   Lawerance Bach Mobility Specialist Please contact via Special educational needs teacher or  Rehab office at [phone]

## 2022-11-27 NOTE — Progress Notes (Signed)
Progress Note   Patient: [name] ZOX:[id number] DOB: 1965/07/31 DOA: 11/24/2022     3 DOS: the patient was seen and examined on 11/27/2022   Brief hospital admission narrative:  Saaid [name]  is a 57 y.o. male, with a past medical history of HTN, pulm HTN, HFpEF, peripheral neuropathy, tobacco use, alcohol abuse, cirrhosis, COPD, anxiety, depression, gout, back pain, T2DM, GERD, alcoholic pancreatitis at [facility] February of this year . -Patient presents to ED secondary to worsening edema and anasarca, still drinking alcohol, reports he cut back significantly down to 3 beers per day, patient report 10 pound weight gain over the last 10 days, he is on Bumex and Aldactone daily, history was obtained from fianc at bedside who is assisting with the history, currently patient has been out for Bumex for some time now, as he has been taking his meds from the pillbox without the Bumex, this is for unknown period of time, he was seen by cardiology yesterday, where his Bumex was refilled, he comes to ER today due to dyspnea on exertion and swelling of his legs, as well he reports generalized weakness and deconditioning, he is with chronic anemia, had iron infusion last Friday and Saturday ED workup significant for albumin of 1.6, magnesium of 1.5, anemia with hemoglobin of 9.2, platelets stable at 156 K's, LFTs within normal limit, he had extensive anasarca, edema up to lower abdomen, will need IV diuresis, TRH consulted to admit for fluid overload/anasarca.  Assessment and Plan: 1-anasarca/decompensated alcoholic liver cirrhosis and hypoalbuminemia -Continue IV diuresis -Metolazone therapy provided for 3 doses. -Patient has received IV albumin. -Continue to follow daily weights, strict I's and O's and low-sodium diet -Closely follow patient's electrolytes and replete as needed. -Overall improving; still with signs of fluid overload.  2-alcohol abuse -Cessation counseling provided -Continue CIWA  protocol monitoring -No signs of acute withdrawal -Continue thiamine and folic acid as ordered.  3-tobacco abuse -Cessation counseling provided -Continue nicotine patch.  4-hypomagnesemia -Magnesium 1.5 on today's blood work -Repleted and will continue daily supplementation. -Continue continue to follow electrolyte trend.  5-history of gout -No acute flare currently appreciated -Continue allopurinol.  6-hyperlipidemia -Continue statin. -Heart healthy diet discussed with patient.  7-type 2 diabetes mellitus with neuropathy -Continue treatment with gabapentin -Continue Lantus and sliding scale insulin. -Continue to follow CBGs and adjust hypoglycemic regimen as needed -A1c 7.0  8-BPH -Continue Flomax. -no complains of urinary retention.  9-generalized weakness -Follow recommendation by PT/OT  10-diarrhea -In the setting of short gut syndrome from previous surgical intervention -Continue as needed loperamide. -Patient advised to maintain adequate oral hydration.  11-class II obesity -Body mass index is 36.31 kg/m. -Low-calorie diet and portion control discussed with patient.  12-yeast dermatitis -Keep area clean and dry -Treatment with nystatin initiated.  13-history of paroxysmal atrial fibrillation -With transient episode of A-fib with RVR resolved after providing patient with IV Lopressor -Continue telemetry monitoring -2D echo has been ordered -Cardiology service consulted.  Will follow the recommendations for continue rate control agents and final decisions regarding anticoagulation. -Maintain potassium above 4 magnesium above 2  14-history of diastolic heart failure -Appears to be compensated on exam-patient reports no shortness of breath, orthopnea or hypoxia -Fluid overload in the setting of liver cirrhosis -Low-sodium diet, daily weights and medication compliance with diuretics has been discussed with patient.   Subjective:  No chest pain, no  nausea, no vomiting, no fever.  Still with signs of fluid overload but improving.  Physical Exam: Vitals:  11/27/22 0409 11/27/22 0411 11/27/22 0604 11/27/22 0821  BP:      Pulse:      Resp:      Temp:      TempSrc:      SpO2: 91% 92% 92% 92%  Weight:      Height:       General exam: Alert, awake, oriented x 3; afebrile.  No acute distress. Respiratory system: Good saturations; no using accessory muscles.  Patient reports no orthopnea. Cardiovascular system: Irregular, no rubs, no gallops, no JVD. Gastrointestinal system: Abdomen is morbidly obese, nontender to palpation, positive bowel sounds.   Central nervous system: No focal neurological deficits. Extremities: No cyanosis or clubbing; 2+ edema appreciated bilaterally. Skin: No petechiae or open wounds. Psychiatry: Judgement and insight appear normal. Mood & affect appropriate.    Latest data Reviewed: Magnesium: 1.5 Basic metabolic panel: Sodium 135, potassium 3.8, chloride 98, bicarb 30, BUN 15, creatinine 0.96 and GFR>60 Magnesium: 1.7  Family Communication: No family at bedside.  Disposition: Status is: Inpatient Remains inpatient appropriate because: Continue IV diuresis.   Planned Discharge Destination: Home  Time spent: 40 minutes  Author: Vassie Loll, MD 11/27/2022 8:47 AM  For on call review [email or web address].

## 2022-11-28 ENCOUNTER — Inpatient Hospital Stay (HOSPITAL_COMMUNITY): Payer: BLUE CROSS/BLUE SHIELD

## 2022-11-28 ENCOUNTER — Other Ambulatory Visit (HOSPITAL_COMMUNITY): Payer: Self-pay | Admitting: *Deleted

## 2022-11-28 DIAGNOSIS — I4891 Unspecified atrial fibrillation: Secondary | ICD-10-CM

## 2022-11-28 DIAGNOSIS — R601 Generalized edema: Secondary | ICD-10-CM | POA: Diagnosis not present

## 2022-11-28 LAB — GLUCOSE, CAPILLARY
Glucose-Capillary: 88 mg/dL (ref 70–99)
Glucose-Capillary: 121 mg/dL — ABNORMAL HIGH (ref 70–99)
Glucose-Capillary: 96 mg/dL (ref 70–99)
Glucose-Capillary: 77 mg/dL (ref 70–99)

## 2022-11-28 MED ORDER — FUROSEMIDE 10 MG/ML IJ SOLN
40.0000 mg | Freq: Two times a day (BID) | INTRAMUSCULAR | Status: DC
  Administered 2022-11-30 – 2022-12-01 (×3): 40 mg via INTRAVENOUS
  Filled 2022-11-28 (×3): qty 4

## 2022-11-28 MED ORDER — LORAZEPAM 2 MG/ML IJ SOLN: 1.0000 mg | INTRAMUSCULAR | Status: DC | PRN

## 2022-11-28 MED ORDER — ADULT MULTIVITAMIN W/MINERALS CH
1.0000 | ORAL_TABLET | Freq: Every day | ORAL | Status: DC
  Administered 2022-11-29 – 2022-12-01 (×3): 1 via ORAL
  Filled 2022-11-28 (×3): qty 1

## 2022-11-28 MED ORDER — MIDODRINE HCL 5 MG PO TABS
10.0000 mg | ORAL_TABLET | Freq: Three times a day (TID) | ORAL | Status: DC
  Administered 2022-11-28 – 2022-12-01 (×10): 10 mg via ORAL
  Filled 2022-11-28 (×10): qty 2

## 2022-11-28 MED ORDER — SODIUM CHLORIDE 0.9 % IV BOLUS
1000.0000 mL | Freq: Once | INTRAVENOUS | Status: AC
  Administered 2022-11-28: 1000 mL via INTRAVENOUS

## 2022-11-28 MED ORDER — LORAZEPAM 1 MG PO TABS: 1.0000 mg | ORAL_TABLET | ORAL | Status: DC | PRN

## 2022-11-28 MED ORDER — FOLIC ACID 1 MG PO TABS
1.0000 mg | ORAL_TABLET | Freq: Every day | ORAL | Status: DC
  Administered 2022-11-29 – 2022-12-01 (×3): 1 mg via ORAL
  Filled 2022-11-28 (×3): qty 1

## 2022-11-28 MED ORDER — PROCHLORPERAZINE EDISYLATE 10 MG/2ML IJ SOLN
10.0000 mg | Freq: Once | INTRAMUSCULAR | Status: AC
  Administered 2022-11-28: 10 mg via INTRAVENOUS
  Filled 2022-11-28: qty 2

## 2022-11-28 MED ORDER — ONDANSETRON HCL 4 MG/2ML IJ SOLN
4.0000 mg | Freq: Four times a day (QID) | INTRAMUSCULAR | Status: DC | PRN
  Administered 2022-11-28 (×2): 4 mg via INTRAVENOUS
  Filled 2022-11-28 (×2): qty 2

## 2022-11-28 MED ORDER — OXYCODONE HCL 5 MG PO TABS
5.0000 mg | ORAL_TABLET | Freq: Three times a day (TID) | ORAL | Status: DC | PRN
  Administered 2022-11-28 – 2022-12-01 (×3): 5 mg via ORAL
  Filled 2022-11-28 (×3): qty 1

## 2022-11-28 MED ORDER — THIAMINE HCL 100 MG/ML IJ SOLN: 100.0000 mg | Freq: Every day | INTRAMUSCULAR | Status: DC

## 2022-11-28 MED ORDER — THIAMINE MONONITRATE 100 MG PO TABS
100.0000 mg | ORAL_TABLET | Freq: Every day | ORAL | Status: DC
  Administered 2022-11-29 – 2022-12-01 (×3): 100 mg via ORAL
  Filled 2022-11-28 (×3): qty 1

## 2022-11-28 MED ORDER — ALBUMIN HUMAN 25 % IV SOLN
25.0000 g | Freq: Four times a day (QID) | INTRAVENOUS | Status: AC
  Administered 2022-11-28 – 2022-11-29 (×3): 25 g via INTRAVENOUS
  Filled 2022-11-28 (×3): qty 100

## 2022-11-28 MED ORDER — SPIRONOLACTONE 25 MG PO TABS
100.0000 mg | ORAL_TABLET | Freq: Every day | ORAL | Status: DC
  Filled 2022-11-28: qty 4

## 2022-11-28 MED ORDER — FUROSEMIDE 10 MG/ML IJ SOLN: 40.0000 mg | Freq: Two times a day (BID) | INTRAMUSCULAR | Status: DC

## 2022-11-28 NOTE — Progress Notes (Signed)
   11/28/22 0749  Vitals  Temp 98.6 F (37 C)  Temp Source Axillary  BP (!) 77/65  MAP (mmHg) 70  BP Location Right Arm  BP Method Automatic  Pulse Rate (!) 130  Resp (!) 21  Level of Consciousness  Level of Consciousness Alert  MEWS COLOR  MEWS Score Color Red  Oxygen Therapy  SpO2 97 %  O2 Device Nasal Cannula  Pain Assessment  Pain Scale 0-10  Pain Score 4  MEWS Score  MEWS Temp 0  MEWS Systolic 2  MEWS Pulse 3  MEWS RR 1  MEWS LOC 0  MEWS Score 6  Provider Notification  Provider Name/Title Shon Hale MD  Date Provider Notified 11/28/22  Time Provider Notified 0730  Provider response See new orders  Date of Provider Response 11/28/22  Time of Provider Response 0800

## 2022-11-28 NOTE — Plan of Care (Signed)

## 2022-11-28 NOTE — Progress Notes (Signed)
Progress Note   Patient: [name] NWG:[id number] DOB: 12-Oct-1965 DOA: 11/24/2022     4 DOS: the patient was seen and examined on 11/28/2022   Brief hospital admission narrative:  Yoshiro Eichorst  is a 57 y.o. male, with a past medical history of HTN, pulm HTN, HFpEF, peripheral neuropathy, tobacco use, alcohol abuse, cirrhosis, COPD, anxiety, depression, gout, back pain, T2DM, GERD, alcoholic pancreatitis at [facility] February of this year . -Patient presents to ED secondary to worsening edema and anasarca, still drinking alcohol, reports he cut back significantly down to 3 beers per day, patient report 10 pound weight gain over the last 10 days, he is on Bumex and Aldactone daily, history was obtained from fianc at bedside who is assisting with the history, currently patient has been out for Bumex for some time now, as he has been taking his meds from the pillbox without the Bumex, this is for unknown period of time, he was seen by cardiology yesterday, where his Bumex was refilled, he comes to ER today due to dyspnea on exertion and swelling of his legs, as well he reports generalized weakness and deconditioning, he is with chronic anemia, had iron infusion last Friday and Saturday ED workup significant for albumin of 1.6, magnesium of 1.5, anemia with hemoglobin of 9.2, platelets stable at 156 K's, LFTs within normal limit, he had extensive anasarca, edema up to lower abdomen, will need IV diuresis, TRH consulted to admit for fluid overload/anasarca.  Assessment and Plan: 1-anasarca/decompensated alcoholic liver cirrhosis and hypoalbuminemia -Treated with IV Lasix -Received metolazone and Aldactone -Patient has received IV albumin. -Continue to follow daily weights, strict I's and O's and low-sodium diet 11/28/22- BP running lower -Hold IV Lasix and p.o. Aldactone especially in the setting of persistent emesis and hypotension -Give IV albumin and IV fluids  2-alcohol abuse -Cessation  counseling provided -Continue CIWA protocol monitoring --Tremors noted -Continue thiamine and folic acid as ordered.  3-intractable emesis -and diarrhea--- query GI bug -stool for GI pathogen -Antiemetics and supportive care for now including as needed loperamide  4-hypomagnesemia -Replaced and normalized  5-history of gout -No acute flare currently appreciated -Continue allopurinol.  6-hyperlipidemia -Continue statin. -Heart healthy diet discussed with patient.  7-type 2 diabetes mellitus with neuropathy--A1c 7.0 reflecting uncontrolled diabetes with hyperglycemia PTA -Continue treatment with gabapentin -Continue Lantus and sliding scale insulin. -Continue to follow CBGs and adjust hypoglycemic regimen as needed  8-BPH -Continue Flomax. -no complains of urinary retention.  9-generalized weakness -Follow recommendation by PT/OT  10-diarrhea -In the setting of short gut syndrome from previous surgical intervention -Continue as needed loperamide. -Patient advised to maintain adequate oral hydration.  11-class II obesity -Body mass index is 36.31 kg/m. -Low-calorie diet and portion control discussed with patient.  12-yeast dermatitis -Keep area clean and dry -Treatment with nystatin initiated.  13-history of paroxysmal atrial fibrillation -With transient episode of A-fib with RVR resolved after providing patient with IV Lopressor -Continue telemetry monitoring -2D echo has been ordered -Cardiology service consulted.  Will follow the recommendations for continue rate control agents and final decisions regarding anticoagulation. -Maintain potassium above 4 magnesium above 2  14-history of diastolic heart failure -Appears to be compensated on exam-patient reports no shortness of breath, orthopnea or hypoxia -Fluid overload in the setting of liver cirrhosis -Low-sodium diet, daily weights and medication compliance with diuretics has been discussed with  patient.  15)tobacco abuse -Cessation counseling provided -Continue nicotine patch.  Subjective:  -Intractable emesis -Low BP and tachycardia No  fever  Or chills   Physical Exam: Vitals:   11/28/22 1148 11/28/22 1454 11/28/22 1938 11/28/22 2013  BP: 98/66 107/68  109/70  Pulse: 100 (!) 112  (!) 107  Resp: 20 19  18   Temp:  98.4 F (36.9 C)  100 F (37.8 C)  TempSrc:  Oral    SpO2: 93% 93% 98% 92%  Weight:      Height:        Physical Exam Gen:- Awake Alert, in no acute distress  HEENT:- [place].AT, No sclera icterus Nose-[place] 3L/min Neck-Supple Neck,No JVD,.  Lungs-  CTAB , fair air movement bilaterally  CV- S1, S2 normal, RRR Abd-  +ve B.Sounds, Abd Soft, No tenderness, healed abdominal scars Extremity/Skin:- +ve  edema,   good pedal pulses  Psych-affect is appropriate, oriented x3 Neuro-no new focal deficits, +ve tremors  Family Communication: No family at bedside.  Disposition: Home Status is: Inpatient Remains inpatient appropriate because: Continue IV diuresis.   Planned Discharge Destination: Home  Time spent: 40 minutes  Author: Shon Hale, MD 11/28/2022 8:20 PM  For on call review [email or web address].

## 2022-11-28 NOTE — Progress Notes (Signed)
   11/28/22 0758  Vitals  BP (!) 76/54  BP Method Manual  Pulse Rate (!) 140  Pulse Rate Source Apical  MEWS COLOR  MEWS Score Color Red  MEWS Score  MEWS Temp 0  MEWS Systolic 2  MEWS Pulse 3  MEWS RR 1  MEWS LOC 0  MEWS Score 6   Manual BP and apical Pulse obtained. Charge nurse [employer] bedside with this nurse. MD notified and new orders placed.

## 2022-11-28 NOTE — Progress Notes (Signed)
*  PRELIMINARY RESULTS* Echocardiogram 2D Echocardiogram has been performed.  Stacey Drain 11/28/2022, 12:09 PM

## 2022-11-28 NOTE — Progress Notes (Signed)
OT Cancellation Note  Patient Details Name: [name] MRN: [id number] DOB: Dec 24, 1965   Cancelled Treatment:    Reason Eval/Treat Not Completed: Medical issues which prohibited therapy. Nursing stated that pt was not stable for OT evaluation due to low blood pressure. Will attempt evaluation later as timer permits and when pt is medically stable.      OT, MOT   Danie Chandler 11/28/2022, 8:44 AM

## 2022-11-29 ENCOUNTER — Other Ambulatory Visit (HOSPITAL_COMMUNITY): Payer: BLUE CROSS/BLUE SHIELD

## 2022-11-29 DIAGNOSIS — R601 Generalized edema: Secondary | ICD-10-CM | POA: Diagnosis not present

## 2022-11-29 LAB — GLUCOSE, CAPILLARY
Glucose-Capillary: 80 mg/dL (ref 70–99)
Glucose-Capillary: 86 mg/dL (ref 70–99)
Glucose-Capillary: 97 mg/dL (ref 70–99)
Glucose-Capillary: 106 mg/dL — ABNORMAL HIGH (ref 70–99)

## 2022-11-29 MED ORDER — DIPHENOXYLATE-ATROPINE 2.5-0.025 MG PO TABS
2.0000 | ORAL_TABLET | Freq: Four times a day (QID) | ORAL | Status: AC
  Administered 2022-11-29 – 2022-11-30 (×5): 2 via ORAL
  Filled 2022-11-29 (×5): qty 2

## 2022-11-29 MED ORDER — SPIRONOLACTONE 25 MG PO TABS
100.0000 mg | ORAL_TABLET | Freq: Every day | ORAL | Status: DC
  Administered 2022-11-30 – 2022-12-01 (×2): 100 mg via ORAL
  Filled 2022-11-29 (×2): qty 4

## 2022-11-29 MED ORDER — SODIUM CHLORIDE 0.9 % IV SOLN: INTRAVENOUS | Status: AC

## 2022-11-29 MED ORDER — INSULIN GLARGINE-YFGN 100 UNIT/ML ~~LOC~~ SOLN
5.0000 [IU] | Freq: Every day | SUBCUTANEOUS | Status: DC
  Administered 2022-12-01: 5 [IU] via SUBCUTANEOUS
  Filled 2022-11-29 (×2): qty 0.05

## 2022-11-29 NOTE — Plan of Care (Signed)
  Problem: Health Behavior/Discharge Planning: Goal: Ability to manage health-related needs will improve Outcome: Progressing   Problem: Clinical Measurements: Goal: Ability to maintain clinical measurements within normal limits will improve Outcome: Progressing Goal: Diagnostic test results will improve Outcome: Progressing   Problem: Nutrition: Goal: Adequate nutrition will be maintained Outcome: Progressing   Problem: Pain Managment: Goal: General experience of comfort will improve Outcome: Progressing   Problem: Skin Integrity: Goal: Risk for impaired skin integrity will decrease Outcome: Progressing

## 2022-11-29 NOTE — Progress Notes (Addendum)
Progress Note   Patient: [name] DOB: [birth year] DOA: 11/24/2022     5 DOS: the patient was seen and examined on 11/29/2022   Brief hospital admission narrative:  [name]  is a 57 y.o. male, with a past medical history of HTN, pulm HTN, HFpEF, peripheral neuropathy, tobacco use, alcohol abuse, cirrhosis, COPD, anxiety, depression, gout, back pain, T2DM, GERD, alcoholic pancreatitis at [facility] February of this year . -Patient presents to ED secondary to worsening edema and anasarca, still drinking alcohol, reports he cut back significantly down to 3 beers per day, patient report 10 pound weight gain over the last 10 days, he is on Bumex and Aldactone daily, history was obtained from fianc at bedside who is assisting with the history, currently patient has been out for Bumex for some time now, as he has been taking his meds from the pillbox without the Bumex, this is for unknown period of time, he was seen by cardiology yesterday, where his Bumex was refilled, he comes to ER today due to dyspnea on exertion and swelling of his legs, as well he reports generalized weakness and deconditioning, he is with chronic anemia, had iron infusion last Friday and Saturday ED workup significant for albumin of 1.6, magnesium of 1.5, anemia with hemoglobin of 9.2, platelets stable at 156 K's, LFTs within normal limit, he had extensive anasarca, edema up to lower abdomen, will need IV diuresis, TRH consulted to admit for fluid overload/anasarca.  Assessment and Plan: 1-anasarca/decompensated alcoholic liver cirrhosis and hypoalbuminemia -Treated with IV Lasix -Received metolazone and Aldactone -Patient has received IV albumin. -Continue to follow daily weights, strict I's and O's and low-sodium diet 11/29/22- BP improved after IV albumin and fluids -Hold IV Lasix and p.o. Aldactone especially in the setting of persistent emesis, diarrhea and hypotension -  2-alcohol abuse -Cessation  counseling provided -Continue CIWA protocol monitoring --Tremors noted -Continue thiamine and folic acid as ordered.  3-intractable emesis -and diarrhea--- query GI bug -Diarrhea remains persistent despite Imodium -stool for GI pathogen pending -Antiemetics and supportive care for now -11/29/22---Added Lomotil  4-hypomagnesemia -Replaced and normalized  5-history of gout -No acute flare currently appreciated -Continue allopurinol.  6-hyperlipidemia -Continue statin. -Heart healthy diet discussed with patient.  7-type 2 diabetes mellitus with neuropathy--A1c 7.0 reflecting uncontrolled diabetes with hyperglycemia PTA -Continue treatment with gabapentin -Continue Lantus and sliding scale insulin. -Continue to follow CBGs and adjust hypoglycemic regimen as needed  8-BPH -Continue Flomax. -no complains of urinary retention.  9-generalized weakness -Follow recommendation by PT/OT  10-diarrhea -In the setting of short gut syndrome from previous surgical intervention -Diarrhea remains persistent despite Imodium -Patient advised to maintain adequate oral hydration. --11/29/22---Added Lomotil  11-class II obesity -Body mass index is 36.31 kg/m. -Low-calorie diet and portion control discussed with patient.  12-yeast dermatitis -Keep area clean and dry -Treatment with nystatin initiated.  13-history of paroxysmal atrial fibrillation -With transient episode of A-fib with RVR resolved after providing patient with IV Lopressor -Continue telemetry monitoring -2D echo has been ordered -Cardiology service consulted.  Will follow the recommendations for continue rate control agents and final decisions regarding anticoagulation. -Maintain potassium above 4 magnesium above 2  14-history of diastolic heart failure -Appears to be compensated on exam-patient reports no shortness of breath, orthopnea or hypoxia -Fluid overload in the setting of liver cirrhosis -Low-sodium diet, daily  weights and medication compliance with diuretics has been discussed with patient.  15)tobacco abuse -Cessation counseling provided -Continue nicotine patch.  Subjective:  -  Nausea persist... Emesis has improved -Diarrhea remains persistent despite Imodium -No blood or mucus in stool -Oral intake is poor   Physical Exam: Vitals:   11/28/22 2235 11/29/22 0255 11/29/22 0759 11/29/22 1312  BP: (!) 99/54 (!) 90/50  110/69  Pulse: 96 95  90  Resp: 18 18  18   Temp: 99.3 F (37.4 C) 98.7 F (37.1 C)  98.7 F (37.1 C)  TempSrc: Oral Oral  Oral  SpO2: 97% 90% 93% 95%  Weight:      Height:        Physical Exam Gen:- Awake Alert, in no acute distress  HEENT:- [place].AT, No sclera icterus Nose-[place] 2L/min Neck-Supple Neck,No JVD,.  Lungs-  CTAB , fair air movement bilaterally  CV- S1, S2 normal, RRR Abd-  +ve B.Sounds, Abd Soft, No tenderness, healed abdominal scars Extremity/Skin:- +ve  edema,   good pedal pulses  Psych-affect is appropriate, oriented x3 Neuro-no new focal deficits, +ve tremors  Family Communication: No family at bedside.  Disposition: Home Status is: Inpatient    Planned Discharge Destination: Home   Author: Shon Hale, MD 11/29/2022 6:57 PM  For on call review [email or web address].

## 2022-11-29 NOTE — Plan of Care (Signed)

## 2022-11-29 NOTE — Progress Notes (Signed)
Physical Therapy Treatment Patient Details Name: [name] MRN: [id number] DOB: August 06, 1965 Today's Date: 11/29/2022   History of Present Illness [name]  is a 57 y.o. male, with a past medical history of HTN, pulm HTN, HFpEF, peripheral neuropathy, tobacco use, alcohol abuse, cirrhosis, COPD, anxiety, depression, gout, back pain, T2DM, GERD, alcoholic pancreatitis at [facility] February of this year .  -Patient presents to ED secondary to worsening edema and anasarca, still drinking alcohol, reports he cut back significantly down to 3 beers per day, patient report 10 pound weight gain over the last 10 days, he is on Bumex and Aldactone daily, history was obtained from fianc at bedside who is assisting with the history, currently patient has been out for Bumex for some time now, as he has been taking his meds from the pillbox without the Bumex, this is for unknown period of time, he was seen by cardiology yesterday, where his Bumex was refilled, he comes to ER today due to dyspnea on exertion and swelling of his legs, as well he reports generalized weakness and deconditioning, he is with chronic anemia, had iron infusion last Friday and Saturday ED workup significant for albumin of 1.6, magnesium of 1.5, anemia with hemoglobin of 9.2, platelets stable at 156 K's, LFTs within normal limit, he had extensive anasarca, edema up to lower abdomen, will need IV diuresis, [facility] hospitalist consulted to admit    PT Comments  Pt needs min assist sit to stand from lower levels, however, once up is able to ambulate with RW with mod I     If plan is discharge home, recommend the following: A little help with walking and/or transfers;A little help with bathing/dressing/bathroom;Help with stairs or ramp for entrance;Assistance with cooking/housework   Can travel by private vehicle        Equipment Recommendations  Rolling walker (2 wheels);Cane       Precautions / Restrictions  Precautions Precautions: None Restrictions Weight Bearing Restrictions: No     Mobility  Bed Mobility         Supine to sit: Min assist          Transfers Overall transfer level: Needs assistance Equipment used: Rolling walker (2 wheels) Transfers: Sit to/from Stand, Bed to chair/wheelchair/BSC Sit to Stand: Min assist Stand pivot transfers: Min assist              Ambulation/Gait Ambulation/Gait assistance: Supervision   Assistive device: Rolling walker (2 wheels) Gait Pattern/deviations: Decreased step length - right, Decreased step length - left, Decreased stride length Gait velocity: decreased               Cognition Arousal: Alert Behavior During Therapy: WFL for tasks assessed/performed Overall Cognitive Status: Within Functional Limits for tasks assessed                                          Exercises General Exercises - Lower Extremity Ankle Circles/Pumps: Seated, 10 reps, Both Quad Sets: Both, 5 reps (SLR) Gluteal Sets: 10 reps (bridge) Long Arc Quad: 10 reps, Both, Seated Heel Slides: Both, 5 reps, Supine Straight Leg Raises: Both, 5 reps, Supine Hip Flexion/Marching: Standing, Both, 10 reps        Pertinent Vitals/Pain Pain Assessment Pain Assessment: No/denies pain       Prior Function   Mod I          PT  Goals (current goals can now be found in the care plan section) Acute Rehab PT Goals Patient Stated Goal: return home with family to assist PT Goal Formulation: With patient Time For Goal Achievement: 11/30/22 Potential to Achieve Goals: Good Progress towards PT goals: Progressing toward goals    Frequency    Min 3X/week      PT Plan  Pt will benefit from continued skilled PT           End of Session Equipment Utilized During Treatment: Gait belt Activity Tolerance: Patient tolerated treatment well;Patient limited by fatigue Patient left: in chair Nurse Communication: Mobility status PT  Visit Diagnosis: Unsteadiness on feet (R26.81);Other abnormalities of gait and mobility (R26.89);Muscle weakness (generalized) (M62.81)     Time: 8295-6213 PT Time Calculation (min) (ACUTE ONLY): 35 min  Charges:    $Gait Training: 8-22 mins $Therapeutic Exercise: 8-22 mins PT General Charges $$ ACUTE PT VISIT: 1 Visit                      Virgina Organ, PT CLT [phone]  11/29/2022, 11:29 AM

## 2022-11-30 ENCOUNTER — Ambulatory Visit (INDEPENDENT_AMBULATORY_CARE_PROVIDER_SITE_OTHER): Payer: BLUE CROSS/BLUE SHIELD | Admitting: Gastroenterology

## 2022-11-30 DIAGNOSIS — R601 Generalized edema: Secondary | ICD-10-CM | POA: Diagnosis not present

## 2022-11-30 LAB — GLUCOSE, CAPILLARY
Glucose-Capillary: 73 mg/dL (ref 70–99)
Glucose-Capillary: 129 mg/dL — ABNORMAL HIGH (ref 70–99)
Glucose-Capillary: 123 mg/dL — ABNORMAL HIGH (ref 70–99)
Glucose-Capillary: 140 mg/dL — ABNORMAL HIGH (ref 70–99)

## 2022-11-30 MED ORDER — IPRATROPIUM-ALBUTEROL 0.5-2.5 (3) MG/3ML IN SOLN: 3.0000 mL | RESPIRATORY_TRACT | Status: DC | PRN

## 2022-11-30 MED ORDER — SODIUM CHLORIDE 0.9 % IV SOLN: INTRAVENOUS | Status: AC

## 2022-11-30 MED ORDER — LIDOCAINE 5 % EX PTCH
1.0000 | MEDICATED_PATCH | CUTANEOUS | Status: DC
  Administered 2022-11-30 – 2022-12-01 (×2): 1 via TRANSDERMAL
  Filled 2022-11-30 (×2): qty 1

## 2022-11-30 MED ORDER — METHOCARBAMOL 500 MG PO TABS
750.0000 mg | ORAL_TABLET | Freq: Four times a day (QID) | ORAL | Status: DC
  Administered 2022-11-30 – 2022-12-01 (×4): 750 mg via ORAL
  Filled 2022-11-30 (×4): qty 2

## 2022-11-30 NOTE — Plan of Care (Signed)

## 2022-11-30 NOTE — Progress Notes (Signed)
Progress Note   Patient: [name] XLK:[id number] DOB: [birth year] DOA: 11/24/2022     6 DOS: the patient was seen and examined on 11/30/2022   Brief hospital admission narrative:  [name]  is a 57 y.o. male, with a past medical history of HTN, pulm HTN, HFpEF, peripheral neuropathy, tobacco use, alcohol abuse, cirrhosis, COPD, anxiety, depression, gout, back pain, T2DM, GERD, alcoholic pancreatitis at [facility] February of this year . -Patient presents to ED secondary to worsening edema and anasarca, still drinking alcohol, reports he cut back significantly down to 3 beers per day, patient report 10 pound weight gain over the last 10 days, he is on Bumex and Aldactone daily, history was obtained from fianc at bedside who is assisting with the history, currently patient has been out for Bumex for some time now, as he has been taking his meds from the pillbox without the Bumex, this is for unknown period of time, he was seen by cardiology yesterday, where his Bumex was refilled, he comes to ER today due to dyspnea on exertion and swelling of his legs, as well he reports generalized weakness and deconditioning, he is with chronic anemia, had iron infusion last Friday and Saturday ED workup significant for albumin of 1.6, magnesium of 1.5, anemia with hemoglobin of 9.2, platelets stable at 156 K's, LFTs within normal limit, he had extensive anasarca, edema up to lower abdomen, will need IV diuresis, TRH consulted to admit for fluid overload/anasarca.  Assessment and Plan: 1-anasarca/decompensated alcoholic liver cirrhosis and hypoalbuminemia -Treated with IV Lasix -Received metolazone and Aldactone -Patient has received IV albumin. -Continue to follow daily weights, strict I's and O's and low-sodium diet 11/30/22- BP improved after IV albumin and fluids Consider restarting  Lasix and p.o. Aldactone if no further emesis and diarrhea improves and oral intake is reliable over the next 24  hours-  2-alcohol abuse -Cessation counseling provided -Continue CIWA protocol monitoring --Tremors noted -Continue thiamine and folic acid as ordered.  3-intractable emesis -and diarrhea--- patient gives history of on and off diarrhea at baseline due to presumed short gut syndrome -stool for GI pathogen continue -Antiemetics and supportive care for now -Failed Imodium -Continue lomotil which was started on 11/29/2022  4-hypomagnesemia -Replaced and normalized  5-history of gout -No acute flare currently appreciated -Continue allopurinol.  6-hyperlipidemia -Continue statin. -Heart healthy diet discussed with patient.  7-type 2 diabetes mellitus with neuropathy--A1c 7.0 reflecting uncontrolled diabetes with hyperglycemia PTA -Continue treatment with gabapentin -Continue Lantus and sliding scale insulin. -Continue to follow CBGs and adjust hypoglycemic regimen as needed  8-BPH -Continue Flomax. -no complains of urinary retention.  9-generalized weakness -Follow recommendation by PT/OT  10-15)tobacco abuse -Cessation counseling provided -Continue nicotine patch.  11-class II obesity -Body mass index is 36.31 kg/m. -Low-calorie diet and portion control discussed with patient.  12-yeast dermatitis -Keep area clean and dry -Treatment with nystatin initiated.  13-history of paroxysmal atrial fibrillation -With transient episode of A-fib with RVR resolved after providing patient with IV Lopressor -Continue telemetry monitoring -2D echo has been ordered -Cardiology service consulted.  Will follow the recommendations for continue rate control agents and final decisions regarding anticoagulation. -Maintain potassium above 4 magnesium above 2  14-history of diastolic heart failure -Appears to be compensated on exam-patient reports no shortness of breath, orthopnea or hypoxia -Fluid overload in the setting of liver cirrhosis -Low-sodium diet, daily weights and medication  compliance with diuretics has been discussed with patient.   Subjective:  --No further emesis -Some  diarrhea persist -Profuse watery stools persist--despite Lomotil - Possible discharge home in am if no further emesis and diarrhea improves and oral intake is reliable over the next 24 hours-   Physical Exam: Vitals:   11/30/22 1240 11/30/22 1442 11/30/22 2017 11/30/22 2034  BP: 92/63 126/79 (!) 107/53   Pulse: (!) 116 98 (!) 102   Resp: 20 16 18    Temp: 98.1 F (36.7 C) 98.3 F (36.8 C) 98.5 F (36.9 C)   TempSrc: Oral Oral    SpO2: 97% 94% 94% 94%  Weight:      Height:        Physical Exam Gen:- Awake Alert, in no acute distress  HEENT:- [place].AT, No sclera icterus Nose-[place] 2L/min Neck-Supple Neck,No JVD,.  Lungs-  CTAB , fair air movement bilaterally  CV- S1, S2 normal, RRR Abd-  +ve B.Sounds, Abd Soft, No tenderness, healed abdominal scars Extremity/Skin:- +ve  edema,   good pedal pulses  Psych-affect is appropriate, oriented x3 Neuro-no new focal deficits, +ve tremors  Family Communication: No family at bedside.  Disposition: Home-in a.m. if diarrhea improves Status is: Inpatient   Planned Discharge Destination: Home   Author: Shon Hale, MD 11/30/2022 8:37 PM  For on call review [email or web address].

## 2022-11-30 NOTE — Plan of Care (Signed)
  Problem: Health Behavior/Discharge Planning: Goal: Ability to manage health-related needs will improve Outcome: Progressing   Problem: Nutrition: Goal: Adequate nutrition will be maintained Outcome: Progressing   Problem: Elimination: Goal: Will not experience complications related to bowel motility Outcome: Progressing   Problem: Pain Managment: Goal: General experience of comfort will improve Outcome: Progressing   

## 2022-11-30 NOTE — Progress Notes (Signed)
   11/30/22 1240  Vitals  Temp 98.1 F (36.7 C)  Temp Source Oral  BP 92/63  MAP (mmHg) 73  BP Location Right Arm  BP Method Automatic  Patient Position (if appropriate) Sitting  Pulse Rate (!) 116  Pulse Rate Source Dinamap  Resp 20  MEWS COLOR  MEWS Score Color Yellow  Oxygen Therapy  SpO2 97 %  O2 Device Room Air  MEWS Score  MEWS Temp 0  MEWS Systolic 1  MEWS Pulse 2  MEWS RR 0  MEWS LOC 0  MEWS Score 3

## 2022-12-01 DIAGNOSIS — R601 Generalized edema: Secondary | ICD-10-CM | POA: Diagnosis not present

## 2022-12-01 LAB — GLUCOSE, CAPILLARY
Glucose-Capillary: 157 mg/dL — ABNORMAL HIGH (ref 70–99)
Glucose-Capillary: 142 mg/dL — ABNORMAL HIGH (ref 70–99)

## 2022-12-01 MED ORDER — FOLIC ACID 1 MG PO TABS: 1.0000 mg | ORAL_TABLET | Freq: Every day | ORAL | 3 refills | Status: AC

## 2022-12-01 MED ORDER — VITAMIN B-1 100 MG PO TABS: 100.0000 mg | ORAL_TABLET | Freq: Every day | ORAL | 3 refills | Status: AC

## 2022-12-01 MED ORDER — MIDODRINE HCL 10 MG PO TABS: 10.0000 mg | ORAL_TABLET | Freq: Two times a day (BID) | ORAL | 1 refills | Status: DC

## 2022-12-01 MED ORDER — NICOTINE 21 MG/24HR TD PT24: 21.0000 mg | MEDICATED_PATCH | Freq: Every day | TRANSDERMAL | 0 refills | Status: AC

## 2022-12-01 MED ORDER — BUMETANIDE 1 MG PO TABS: 1.0000 mg | ORAL_TABLET | Freq: Every day | ORAL | 1 refills | Status: DC

## 2022-12-01 MED ORDER — DIPHENOXYLATE-ATROPINE 2.5-0.025 MG PO TABS: 2.0000 | ORAL_TABLET | Freq: Four times a day (QID) | ORAL | 0 refills | Status: AC | PRN

## 2022-12-01 MED ORDER — HYDROXYZINE HCL 25 MG PO TABS: 50.0000 mg | ORAL_TABLET | Freq: Three times a day (TID) | ORAL | 1 refills | Status: AC | PRN

## 2022-12-01 MED ORDER — MULTI-VITAMIN/MINERALS PO TABS: 1.0000 | ORAL_TABLET | Freq: Every day | ORAL | 2 refills | Status: AC

## 2022-12-01 MED ORDER — TAMSULOSIN HCL 0.4 MG PO CAPS: 0.4000 mg | ORAL_CAPSULE | Freq: Every day | ORAL | 2 refills | Status: AC

## 2022-12-01 NOTE — Discharge Instructions (Signed)
1)Very low-salt diet advised--- less than 2 g of sodium per day advised 2)Weigh yourself daily, call if you gain more than 3 pounds in 1 day or more than 5 pounds in 1 week as your diuretic medications may need to be adjusted 3)Limit your Fluid  intake to no more than 60 ounces (1.8 Liters) per day 4) please take midodrine--For BP support--to Prevent Low BP 5)Please repeat CBC and CMP as well as PT/INR blood test in 5 to 7 days from now 6)Please Follow up with Gastroenterologist Dr. Levon Hedger-- address [address], [place] [address],,[facility] Number [phone]    7)Avoid ibuprofen/Advil/Aleve/Motrin/Goody Powders/Naproxen/BC powders/Meloxicam/Diclofenac/Indomethacin and other Nonsteroidal anti-inflammatory medications as these will make you more likely to bleed and can cause stomach ulcers, can also cause Kidney problems.

## 2022-12-01 NOTE — Progress Notes (Signed)
Patient has been given prn medication during this shift for diarrhea. Continues to have diarrhea stools, cleaned up numerous times by this Clinical research associate and Psychologist, sport and exercise. At one point during the night, the patient was able to call out and let us know he had to use the bathroom and walked to the restroom and back to bed with one assist. Plan of care ongoing.

## 2022-12-01 NOTE — Plan of Care (Signed)

## 2022-12-01 NOTE — Discharge Summary (Signed)
[name], is a 57 y.o. male  DOB [birth year]  MRN [id number].  Admission date:  11/24/2022  Admitting Physician  Starleen Arms, MD  Discharge Date:  12/01/2022   Primary MD  Benita Stabile, MD  Recommendations for primary care physician for things to follow:  1)Very low-salt diet advised--- less than 2 g of sodium per day advised 2)Weigh yourself daily, call if you gain more than 3 pounds in 1 day or more than 5 pounds in 1 week as your diuretic medications may need to be adjusted 3)Limit your Fluid  intake to no more than 60 ounces (1.8 Liters) per day 4) please take midodrine--For BP support--to Prevent Low BP 5)Please repeat CBC and CMP as well as PT/INR blood test in 5 to 7 days from now 6)Please Follow up with Gastroenterologist Dr. Levon Hedger-- address [address], [place] [address],,XBJYN Number [phone]    7)Avoid ibuprofen/Advil/Aleve/Motrin/Goody Powders/Naproxen/BC powders/Meloxicam/Diclofenac/Indomethacin and other Nonsteroidal anti-inflammatory medications as these will make you more likely to bleed and can cause stomach ulcers, can also cause Kidney problems.  Admission Diagnosis  Anasarca [R60.1]  Discharge Diagnosis  Anasarca [R60.1]    Principal Problem:   Anasarca Active Problems:   Morbid obesity (HCC)   Tobacco abuse   Essential hypertension   Alcohol abuse   Diabetic neuropathy (HCC)   GERD (gastroesophageal reflux disease)   Anxiety and depression   Cirrhosis (HCC)   COPD (chronic obstructive pulmonary disease) (HCC)   Sleep apnea   Hypoalbuminemia   Chronic inflammatory demyelinating polyradiculoneuropathy (HCC)     Past Medical History:  Diagnosis Date   Cirrhosis of liver (HCC)    Degeneration of lumbar intervertebral disc    Diabetes mellitus without complication (HCC)    Diabetic neuropathy (HCC) 03/25/2012   Diverticulitis    DM type 2  (diabetes mellitus, type 2) (HCC) 03/24/2012   Gout    hx of   Hyperlipidemia 03/24/2012   Hypertension    Incisional hernia without mention of obstruction or gangrene 02/07/2011   Pancreatitis    Paroxysmal atrial fibrillation (HCC) 03/25/2012   Transient, occurred off of atenolol.   Renal disorder    Past Surgical History:  Procedure Laterality Date   APPENDECTOMY  04/15/2009   COLON SURGERY  04/15/2009   HERNIA REPAIR  01/25/11   lap ventral hernia repair x9   LAPAROSCOPIC CHOLECYSTECTOMY  1993   LAPAROSCOPIC LYSIS OF ADHESIONS N/A 09/17/2012   Procedure: LAPAROSCOPIC and Open  LYSIS OF ADHESIONS;  Surgeon: Ardeth Sportsman, MD;  Location: [facility];  Service: General;  Laterality: N/A;   VENTRAL HERNIA REPAIR N/A 09/17/2012   Procedure: LAPAROSCOPIC VENTRAL HERNIA;  Surgeon: Ardeth Sportsman, MD;  Location: [facility];  Service: General;  Laterality: N/A;     HPI  from the history and physical done on the day of admission:     [name]  is a 57 y.o. male, with a past medical history of HTN, pulm HTN, HFpEF, peripheral neuropathy, tobacco use, alcohol  abuse, cirrhosis, COPD, anxiety, depression, gout, back pain, T2DM, GERD, alcoholic pancreatitis at [facility] February of this year . -Patient presents to ED secondary to worsening edema and anasarca, still drinking alcohol, reports he cut back significantly down to 3 beers per day, patient report 10 pound weight gain over the last 10 days, he is on Bumex and Aldactone daily, history was obtained from fianc at bedside who is assisting with the history, currently patient has been out for Bumex for some time now, as he has been taking his meds from the pillbox without the Bumex, this is for unknown period of time, he was seen by cardiology yesterday, where his Bumex was refilled, he comes to ER today due to dyspnea on exertion and swelling of his legs, as well he reports generalized weakness and deconditioning, he is with chronic anemia, had  iron infusion last Friday and Saturday ED workup significant for albumin of 1.6, magnesium of 1.5, anemia with hemoglobin of 9.2, platelets stable at 156 K's, LFTs within normal limit, he had extensive anasarca, edema up to lower abdomen, will need IV diuresis, S[facility] hospitalist consulted to admit     Hospital Course:    Assessment and Plan: 1-anasarca/decompensated alcoholic liver cirrhosis and hypoalbuminemia -Treated with IV Lasix -Received metolazone and Aldactone -Patient has received IV albumin. -No further emesis, diarrhea has improved, okay to discharge home on oral Bumex, along with Aldactone   2-alcohol abuse -Cessation counseling provided -Treated with lorazepam per [facility]WA protocol ---Tremors resolved -Continue thiamine and folic acid as ordered.   3-intractable emesis -and diarrhea--- patient gives history of on and off diarrhea at baseline due to presumed short gut syndrome -stool for GI pathogen negative -Antiemetics and supportive care for now -Failed Imodium -Improved with Imodium -Outpatient follow-up with Dr. Levon Hedger advised   4-hypomagnesemia -Replaced and normalized   5-history of gout -No acute flare currently appreciated -Continue allopurinol.   6-hyperlipidemia -Continue statin. -Heart healthy diet discussed with patient.   7-type 2 diabetes mellitus with neuropathy--A1c 7.0 reflecting uncontrolled diabetes with hyperglycemia PTA -Continue treatment with gabapentin for diabetic neuropathy -Resume PTA diabetic regimen   8-BPH -Continue Flomax. -no complains of urinary retention.   9-)tobacco abuse -Cessation counseling provided -Continue nicotine patch.  10)history of paroxysmal atrial fibrillation -With transient episode of A-fib with RVR resolved after providing patient with IV Lopressor -Echo with EF of 55%, with grade 1 diastolic dysfunction and mild to moderate left atrial dilatation no mitral stenosis, no aortic stenosis -  11-class  II obesity -Body mass index is 36.31 kg/m. -Low-calorie diet and portion control discussed with patient.   12-yeast dermatitis -Keep area clean and dry -Treatment with nystatin initiated.   13- history of diastolic heart failure -Appears to be compensated on exam-patient reports no shortness of breath, orthopnea or hypoxia -Fluid overload in the setting of liver cirrhosis -Low-sodium diet, daily weights and medication compliance with diuretics has been discussed with patient.   Discharge Condition: Stable  Follow UP   Follow-up Information     Marguerita Merles, Reuel Boom, MD. Schedule an appointment as soon as possible for a visit in 1 week(s).   Specialty: Gastroenterology Contact information: [address] [place] [address] [phone]                 Diet and Activity recommendation:  As advised  Discharge Instructions    Discharge Instructions     Ambulatory Referral for Lung Cancer Scre   Complete by: As directed  Call MD for:  difficulty breathing, headache or visual disturbances   Complete by: As directed    Call MD for:  persistant dizziness or light-headedness   Complete by: As directed    Call MD for:  persistant nausea and vomiting   Complete by: As directed    Call MD for:  temperature >100.4   Complete by: As directed    Diet - low sodium heart healthy   Complete by: As directed    Discharge instructions   Complete by: As directed    1)Very low-salt diet advised--- less than 2 g of sodium per day advised 2)Weigh yourself daily, call if you gain more than 3 pounds in 1 day or more than 5 pounds in 1 week as your diuretic medications may need to be adjusted 3)Limit your Fluid  intake to no more than 60 ounces (1.8 Liters) per day 4) please take midodrine--For BP support--to Prevent Low BP 5)Please repeat CBC and CMP as well as PT/INR blood test in 5 to 7 days from now 6)Please Follow up with Gastroenterologist Dr. Levon Hedger--  address [address], [address], [place] [address],,MVHQI Number [phone]    7)Avoid ibuprofen/Advil/Aleve/Motrin/Goody Powders/Naproxen/BC powders/Meloxicam/Diclofenac/Indomethacin and other Nonsteroidal anti-inflammatory medications as these will make you more likely to bleed and can cause stomach ulcers, can also cause Kidney problems.   Increase activity slowly   Complete by: As directed         Discharge Medications     Allergies as of 12/01/2022       Reactions   Hydrocodone Itching   Venlafaxine Itching        Medication List     STOP taking these medications    MAGnesium-Oxide 400 (240 Mg) MG tablet Generic drug: magnesium oxide       TAKE these medications    acetaminophen 500 MG tablet Commonly known as: TYLENOL Take 1,000 mg by mouth 2 (two) times daily.   allopurinol 300 MG tablet Commonly known as: ZYLOPRIM Take 300 mg by mouth 2 (two) times daily.   bumetanide 2 MG tablet Commonly known as: Bumex Take 1 tablet (2 mg total) by mouth daily. What changed: Another medication with the same name was added. Make sure you understand how and when to take each.   bumetanide 1 MG tablet Commonly known as: BUMEX Take 1 tablet (1 mg total) by mouth daily. What changed: You were already taking a medication with the same name, and this prescription was added. Make sure you understand how and when to take each.   cetirizine 10 MG tablet Commonly known as: ZYRTEC Take 10 mg by mouth daily.   diphenoxylate-atropine 2.5-0.025 MG tablet Commonly known as: LOMOTIL Take 2 tablets by mouth 4 (four) times daily as needed for diarrhea or loose stools.   folic acid 1 MG tablet Commonly known as: FOLVITE Take 1 tablet (1 mg total) by mouth daily.   gabapentin 300 MG capsule Commonly known as: NEURONTIN Take 600 mg by mouth in the morning and at bedtime. *May take 600mg  in addition if needed   GAS RELIEF PO Take 1 tablet by mouth daily as needed (for  gas relief).   hydrOXYzine 25 MG tablet Commonly known as: ATARAX Take 2 tablets (50 mg total) by mouth every 8 (eight) hours as needed for itching or anxiety. What changed: reasons to take this   Jardiance 25 MG Tabs tablet Generic drug: empagliflozin Take 25 mg by mouth daily.   Lantus SoloStar 100  UNIT/ML Solostar Pen Generic drug: insulin glargine 5-7 Units at bedtime.   loperamide 2 MG capsule Commonly known as: IMODIUM Take 2 mg by mouth daily as needed for diarrhea or loose stools.   melatonin 3 MG Tabs tablet Take 3 mg by mouth at bedtime.   midodrine 10 MG tablet Commonly known as: PROAMATINE Take 1 tablet (10 mg total) by mouth 2 (two) times daily with a meal. For BP support--to Prevent Low BP   multivitamin with minerals tablet Take 1 tablet by mouth daily.   nicotine 21 mg/24hr patch Commonly known as: NICODERM CQ - dosed in mg/24 hours Place 1 patch (21 mg total) onto the skin daily.   omeprazole 20 MG capsule Commonly known as: PRILOSEC Take 20 mg by mouth daily.   pravastatin 20 MG tablet Commonly known as: PRAVACHOL Take 20 mg by mouth daily.   spironolactone 100 MG tablet Commonly known as: ALDACTONE Take 100 mg by mouth daily.   tamsulosin 0.4 MG Caps capsule Commonly known as: FLOMAX Take 1 capsule (0.4 mg total) by mouth daily.   thiamine 100 MG tablet Commonly known as: Vitamin B-1 Take 1 tablet (100 mg total) by mouth daily.       ECHOCARDIOGRAM COMPLETE  Result Date: 11/28/2022    ECHOCARDIOGRAM REPORT   Patient Name:   Derin Mervyn Skeeters [facility] Date of Exam: 11/28/2022 Medical Rec #:  [id number]     Height:       71.0 in Accession #:    [id number]    Weight:       260.4 lb Date of Birth:  August 01, 1965     BSA:          2.359 m Patient Age:    57 years      BP:           76/54 mmHg Patient Gender: M             HR:           98 bpm. [facility] Procedure: 2D Echo, Cardiac Doppler and Color Doppler Indications:    Atrial Fibrillation I48.91   History:        Patient has no prior history of Echocardiogram examinations.                 COPD, Arrythmias:Atrial Fibrillation; Risk Factors:Hypertension,                 Diabetes and Dyslipidemia. Alcohol abuse, Anasarca, Morbid                 obesity, Tobacco abuse.  Sonographer:    Celesta Gentile RCS Referring Phys: [phone] CARLOS MADERA IMPRESSIONS  1. Left ventricular ejection fraction, by estimation, is >75%. The left ventricle has hyperdynamic function. The left ventricle has no regional wall motion abnormalities. Left ventricular diastolic parameters are consistent with Grade I diastolic dysfunction (impaired relaxation).  2. Right ventricular systolic function is normal. The right ventricular size is normal. Tricuspid regurgitation signal is inadequate for assessing PA pressure.  3. Left atrial size was mild to moderately dilated.  4. The mitral valve is normal in structure. No evidence of mitral valve regurgitation. No evidence of mitral stenosis.  5. The aortic valve has an indeterminant number of cusps. Aortic valve regurgitation is not visualized. No aortic stenosis is present.  6. Aortic dilatation noted. There is borderline dilatation of the aortic root, measuring 39 mm. Comparison(s): No prior Echocardiogram. FINDINGS  Left Ventricle: Left ventricular ejection  fraction, by estimation, is >75%. The left ventricle has hyperdynamic function. The left ventricle has no regional wall motion abnormalities. The left ventricular internal cavity size was normal in size. There is no left ventricular hypertrophy. Left ventricular diastolic parameters are consistent with Grade I diastolic dysfunction (impaired relaxation). Right Ventricle: The right ventricular size is normal. No increase in right ventricular wall thickness. Right ventricular systolic function is normal. Tricuspid regurgitation signal is inadequate for assessing PA pressure. Left Atrium: Left atrial size was mild to moderately dilated. Right  Atrium: Right atrial size was normal in size. Pericardium: There is no evidence of pericardial effusion. Mitral Valve: The mitral valve is normal in structure. No evidence of mitral valve regurgitation. No evidence of mitral valve stenosis. Tricuspid Valve: The tricuspid valve is grossly normal. Tricuspid valve regurgitation is not demonstrated. No evidence of tricuspid stenosis. Aortic Valve: The aortic valve has an indeterminant number of cusps. Aortic valve regurgitation is not visualized. No aortic stenosis is present. Pulmonic Valve: The pulmonic valve was not well visualized. Pulmonic valve regurgitation is not visualized. No evidence of pulmonic stenosis. Aorta: Aortic dilatation noted. There is borderline dilatation of the aortic root, measuring 39 mm. Venous: The inferior vena cava was not well visualized. IAS/Shunts: The interatrial septum was not well visualized.  LEFT VENTRICLE PLAX 2D LVIDd:         4.00 cm   Diastology LVIDs:         2.10 cm   LV e' medial:    6.64 cm/s LV PW:         1.00 cm   LV E/e' medial:  13.1 LV IVS:        1.20 cm   LV e' lateral:   7.51 cm/s LVOT diam:     2.20 cm   LV E/e' lateral: 11.6 LV SV:         90 LV SV Index:   38 LVOT Area:     3.80 cm  RIGHT VENTRICLE RV S prime:     16.30 cm/s TAPSE (M-mode): 2.3 cm LEFT ATRIUM             Index        RIGHT ATRIUM           Index LA diam:        3.80 cm 1.61 cm/m   RA Area:     18.40 cm LA Vol (A2C):   96.0 ml 40.70 ml/m  RA Volume:   53.40 ml  22.64 ml/m LA Vol (A4C):   88.7 ml 37.61 ml/m LA Biplane Vol: 93.4 ml 39.60 ml/m  AORTIC VALVE LVOT Vmax:   132.00 cm/s LVOT Vmean:  89.300 cm/s LVOT VTI:    0.238 m  AORTA Ao Root diam: 3.90 cm MITRAL VALVE MV Area (PHT): 4.60 cm     SHUNTS MV Decel Time: 165 msec     Systemic VTI:  0.24 m MV E velocity: 86.80 cm/s   Systemic Diam: 2.20 cm MV A velocity: 120.00 cm/s MV E/A ratio:  0.72 Vishnu Priya Mallipeddi Electronically signed by Winfield Rast Mallipeddi Signature Date/Time:  11/28/2022/2:24:40 PM    Final    DG Chest Portable 1 View  Addendum Date: 11/24/2022   ADDENDUM REPORT: 11/24/2022 16:02 ADDENDUM: Voice recognition error identified in the impression section of the report. The impression should read as follows IMPRESSION: IMPRESSION Basilar atelectasis.  Otherwise no acute cardiopulmonary process. Electronically Signed   By: Kennith Center M.D.   On: 11/24/2022  16:02   Result Date: 11/24/2022 CLINICAL DATA:  Leg swelling. EXAM: PORTABLE CHEST 1 VIEW COMPARISON:  08/23/2022 FINDINGS: Low volume film. Cardiopericardial silhouette is at upper limits of normal for size. Basilar atelectasis. No edema or focal consolidation. No substantial pleural effusion. Telemetry leads overlie the chest. IMPRESSION: Fall Electronically Signed: By: Kennith Center M.D. On: 11/24/2022 15:00    Micro Results   Recent Results (from the past 240 hour(s))  Gastrointestinal Panel by PCR , Stool     Status: None   Collection Time: 11/29/22  2:59 AM   Specimen: Stool  Result Value Ref Range Status   Campylobacter species NOT DETECTED NOT DETECTED Final   Plesimonas shigelloides NOT DETECTED NOT DETECTED Final   Salmonella species NOT DETECTED NOT DETECTED Final   Yersinia enterocolitica NOT DETECTED NOT DETECTED Final   Vibrio species NOT DETECTED NOT DETECTED Final   Vibrio cholerae NOT DETECTED NOT DETECTED Final   Enteroaggregative E coli (EAEC) NOT DETECTED NOT DETECTED Final   Enteropathogenic E coli (EPEC) NOT DETECTED NOT DETECTED Final   Enterotoxigenic E coli (ETEC) NOT DETECTED NOT DETECTED Final   Shiga like toxin producing E coli (STEC) NOT DETECTED NOT DETECTED Final   Shigella/Enteroinvasive E coli (EIEC) NOT DETECTED NOT DETECTED Final   Cryptosporidium NOT DETECTED NOT DETECTED Final   Cyclospora cayetanensis NOT DETECTED NOT DETECTED Final   Entamoeba histolytica NOT DETECTED NOT DETECTED Final   Giardia lamblia NOT DETECTED NOT DETECTED Final   Adenovirus F40/41 NOT  DETECTED NOT DETECTED Final   Astrovirus NOT DETECTED NOT DETECTED Final   Norovirus GI/GII NOT DETECTED NOT DETECTED Final   Rotavirus A NOT DETECTED NOT DETECTED Final   Sapovirus (I, II, IV, and V) NOT DETECTED NOT DETECTED Final    Comment: Performed at [facility], [address] [place], [place] [address]   Today   Subjective   Kailash Glassburn today has no new complaints -Diarrhea has improved -Eating and drinking well -No further emesis          Patient has been seen and examined prior to discharge   Objective   Blood pressure (!) 107/53, pulse (!) 102, temperature 98.5 F (36.9 C), resp. rate 18, height 5\' 11"  (1.803 m), weight 118.1 kg, SpO2 94%.   Intake/Output Summary (Last 24 hours) at 12/01/2022 1355 Last data filed at 12/01/2022 1317 Gross per 24 hour  Intake 720 ml  Output --  Net 720 ml   Exam Gen:- Awake Alert, no acute distress  HEENT:- [place].AT, No sclera icterus Neck-Supple Neck,No JVD,.  Lungs-  CTAB , good air movement bilaterally CV- S1, S2 normal, regular Abd-  +ve B.Sounds, Abd Soft, No tenderness, healed abdominal scars Extremity/Skin:-Improved edema,   good pulses Psych-affect is appropriate, oriented x3 Neuro-no new focal deficits, no tremors    Data Review   CBC w Diff:  Lab Results  Component Value Date   WBC 7.4 11/30/2022   HGB 8.1 (L) 11/30/2022   HGB 16.2 12/16/2017   HCT 28.1 (L) 11/30/2022   HCT 46.9 12/16/2017   PLT 155 11/30/2022   PLT 241 12/16/2017   LYMPHOPCT 22 11/24/2022   MONOPCT 7 11/24/2022   EOSPCT 2 11/24/2022   BASOPCT 1 11/24/2022   CMP:  Lab Results  Component Value Date   NA 132 (L) 12/01/2022   NA 139 12/16/2017   K 4.2 12/01/2022   CL 108 12/01/2022   CO2 16 (L) 12/01/2022   BUN 27 (H) 12/01/2022  BUN 13 12/16/2017   CREATININE 1.56 (H) 12/01/2022   PROT 5.8 (L) 11/30/2022   PROT 7.7 12/16/2017   ALBUMIN 2.3 (L) 12/01/2022   ALBUMIN 4.5 12/16/2017   BILITOT 0.8 11/30/2022   BILITOT 0.6  12/16/2017   ALKPHOS 64 11/30/2022   AST 17 11/30/2022   ALT 9 11/30/2022   Total Discharge time is about 33 minutes  Shon Hale M.D on 12/01/2022 at 1:55 PM  Go to [email or web address] -  for contact info  [facility] - Office  [phone]

## 2022-12-04 ENCOUNTER — Ambulatory Visit (INDEPENDENT_AMBULATORY_CARE_PROVIDER_SITE_OTHER): Payer: BLUE CROSS/BLUE SHIELD | Admitting: Gastroenterology

## 2022-12-05 ENCOUNTER — Encounter: Payer: Self-pay | Admitting: *Deleted

## 2022-12-05 ENCOUNTER — Telehealth: Payer: Self-pay | Admitting: Internal Medicine

## 2022-12-05 DIAGNOSIS — I509 Heart failure, unspecified: Secondary | ICD-10-CM

## 2022-12-05 NOTE — Telephone Encounter (Signed)
  Pricilla Riffle, MD 11/25/2022 10:20 PM EDT     Electrolytes   Sodium is a little low  134    Repeat BMET next Friday Glucose high at 202    Watch carbs/sugars Fluid number normal   I am not sure accurate. Again, will get follow up labs

## 2022-12-05 NOTE — Telephone Encounter (Signed)
Pt gf calling in stating she is returning a call from Leonard, LPN.

## 2022-12-05 NOTE — Telephone Encounter (Signed)
Pts girlfriend notified of testing results- ok per DPR. Pt's gf had no questions or concerns at this time.

## 2022-12-07 ENCOUNTER — Other Ambulatory Visit (HOSPITAL_COMMUNITY)
Admission: RE | Admit: 2022-12-07 | Discharge: 2022-12-07 | Disposition: A | Discharge status: Home / Self Care | Payer: BLUE CROSS/BLUE SHIELD | Source: Ambulatory Visit | Attending: Internal Medicine | Admitting: Internal Medicine

## 2022-12-07 DIAGNOSIS — I509 Heart failure, unspecified: Secondary | ICD-10-CM | POA: Diagnosis not present

## 2022-12-07 LAB — BASIC METABOLIC PANEL
Anion gap: 10 (ref 5–15)
BUN: 24 mg/dL — ABNORMAL HIGH (ref 6–20)
CO2: 21 mmol/L — ABNORMAL LOW (ref 22–32)
Calcium: 8 mg/dL — ABNORMAL LOW (ref 8.9–10.3)
Chloride: 99 mmol/L (ref 98–111)
Creatinine, Ser: 1.24 mg/dL (ref 0.61–1.24)
GFR, Estimated: >60 mL/min (ref >60)
Glucose, Bld: 239 mg/dL — ABNORMAL HIGH (ref 70–99)
Potassium: 3.9 mmol/L (ref 3.5–5.1)
Sodium: 130 mmol/L — ABNORMAL LOW (ref 135–145)

## 2022-12-10 ENCOUNTER — Telehealth: Payer: Self-pay | Admitting: Internal Medicine

## 2022-12-10 DIAGNOSIS — I509 Heart failure, unspecified: Secondary | ICD-10-CM

## 2022-12-10 MED ORDER — BUMETANIDE 2 MG PO TABS: 2.0000 mg | ORAL_TABLET | ORAL | 3 refills | Status: DC

## 2022-12-10 MED ORDER — BUMETANIDE 1 MG PO TABS: 1.0000 mg | ORAL_TABLET | ORAL | 1 refills | Status: DC

## 2022-12-10 NOTE — Telephone Encounter (Signed)
Pam, patient's significant other called for lab results.

## 2022-12-10 NOTE — Telephone Encounter (Signed)
  Pricilla Riffle, MD 12/07/2022  4:43 PM EDT     Sodium is a little lower than previous I would change bumex to every other day   Keep on spironolactone   Follow up BMET in 10 days with BNP   Spoke with Isabella Stalling ( ok per [facility]) results given Pam voiced understanding.

## 2022-12-28 ENCOUNTER — Other Ambulatory Visit (HOSPITAL_COMMUNITY)
Admission: RE | Admit: 2022-12-28 | Discharge: 2022-12-28 | Disposition: A | Discharge status: Home / Self Care | Payer: BLUE CROSS/BLUE SHIELD | Source: Ambulatory Visit | Attending: Internal Medicine | Admitting: Internal Medicine

## 2022-12-28 DIAGNOSIS — I509 Heart failure, unspecified: Secondary | ICD-10-CM | POA: Diagnosis present

## 2022-12-28 LAB — BASIC METABOLIC PANEL
Anion gap: 7 (ref 5–15)
BUN: 17 mg/dL (ref 6–20)
CO2: 26 mmol/L (ref 22–32)
Calcium: 7.9 mg/dL — ABNORMAL LOW (ref 8.9–10.3)
Chloride: 99 mmol/L (ref 98–111)
Creatinine, Ser: 1.03 mg/dL (ref 0.61–1.24)
GFR, Estimated: >60 mL/min (ref >60)
Glucose, Bld: 187 mg/dL — ABNORMAL HIGH (ref 70–99)
Potassium: 5.1 mmol/L (ref 3.5–5.1)
Sodium: 132 mmol/L — ABNORMAL LOW (ref 135–145)

## 2022-12-28 LAB — BRAIN NATRIURETIC PEPTIDE: B Natriuretic Peptide: 111 pg/mL — ABNORMAL HIGH (ref 0.0–100.0)

## 2023-01-04 ENCOUNTER — Encounter: Payer: Self-pay | Admitting: Student

## 2023-01-04 ENCOUNTER — Other Ambulatory Visit (HOSPITAL_COMMUNITY): Payer: BLUE CROSS/BLUE SHIELD

## 2023-01-04 ENCOUNTER — Ambulatory Visit: Payer: BLUE CROSS/BLUE SHIELD | Attending: Student | Admitting: Student

## 2023-01-04 VITALS — BP 112/80 | HR 88 | Ht 71.0 in | Wt 251.4 lb

## 2023-01-04 DIAGNOSIS — I480 Paroxysmal atrial fibrillation: Secondary | ICD-10-CM

## 2023-01-04 DIAGNOSIS — K7030 Alcoholic cirrhosis of liver without ascites: Secondary | ICD-10-CM

## 2023-01-04 DIAGNOSIS — Z79899 Other long term (current) drug therapy: Secondary | ICD-10-CM | POA: Diagnosis not present

## 2023-01-04 DIAGNOSIS — I10 Essential (primary) hypertension: Secondary | ICD-10-CM | POA: Diagnosis not present

## 2023-01-04 DIAGNOSIS — I5032 Chronic diastolic (congestive) heart failure: Secondary | ICD-10-CM

## 2023-01-04 DIAGNOSIS — E785 Hyperlipidemia, unspecified: Secondary | ICD-10-CM

## 2023-01-04 MED ORDER — BUMETANIDE 1 MG PO TABS: 1.0000 mg | ORAL_TABLET | Freq: Every day | ORAL | 3 refills | Status: AC

## 2023-01-04 MED ORDER — MIDODRINE HCL 5 MG PO TABS: 5.0000 mg | ORAL_TABLET | Freq: Two times a day (BID) | ORAL | 3 refills | Status: AC

## 2023-01-04 MED ORDER — MIDODRINE HCL 5 MG PO TABS: 5.0000 mg | ORAL_TABLET | Freq: Three times a day (TID) | ORAL | 3 refills | Status: DC

## 2023-01-04 MED ORDER — BUMETANIDE 2 MG PO TABS: 2.0000 mg | ORAL_TABLET | Freq: Every day | ORAL | 3 refills | Status: AC

## 2023-01-04 NOTE — Patient Instructions (Addendum)
Medication Instructions:  Your physician recommends that you continue on your current medications as directed. Please refer to the Current Medication list given to you today.   Decrease Midodrine 5 mg Two Times daily   Increase Bumex to 3 mg Daily   Monitor Blood Pressure for 2-3 weeks and call or return to office.   *If you need a refill on your cardiac medications before your next appointment, please call your pharmacy*   Lab Work: Your physician recommends that you return for lab work in 2 Weeks ( BMET)   If you have labs (blood work) drawn today and your tests are completely normal, you will receive your results only by: MyChart Message (if you have MyChart) OR A paper copy in the mail If you have any lab test that is abnormal or we need to change your treatment, we will call you to review the results.   Testing/Procedures: NONE    Follow-Up: At [facility], you and your health needs are our priority.  As part of our continuing mission to provide you with exceptional heart care, we have created designated Provider Care Teams.  These Care Teams include your primary Cardiologist (physician) and Advanced Practice Providers (APPs -  Physician Assistants and Nurse Practitioners) who all work together to provide you with the care you need, when you need it.  We recommend signing up for the patient portal called "MyChart".  Sign up information is provided on this After Visit Summary.  MyChart is used to connect with patients for Virtual Visits (Telemedicine).  Patients are able to view lab/test results, encounter notes, upcoming appointments, etc.  Non-urgent messages can be sent to your provider as well.   To learn more about what you can do with MyChart, go to [email or web address].    Your next appointment:   2 -3 month(s)  Provider:   You may see Dietrich Pates, MD or one of the following Advanced Practice Providers on your designated Care Team:   Randall An, PA-C   Jacolyn Reedy, PA-C     Other Instructions Thank you for choosing [facility]!

## 2023-01-04 NOTE — Progress Notes (Signed)
Cardiology Office Note    Date:  01/04/2023  ID:  [name], DOB Apr 17, 1966, MRN [id number] Cardiologist: Dietrich Pates, MD    History of Present Illness:    [name] is a 57 y.o. male with past medical history of coronary calcification by prior CT, HTN, HLD, Type II DM, liver cirrhosis, recurrent pancreatitis, COPD, OSA, anemia, paroxysmal atrial fibrillation and HFpEF who presents to the office today for hospital follow-up.  He was examined by Dr. Tenny Craw in 11/2022 as a new patient referral and reported worsening lower extremity edema over the past 4 weeks and he had been without Bumex. He was restarted on Bumex 2 mg daily with plans for a follow-up echocardiogram. He was not on anticoagulation given history of atrial fibrillation and it was recommended to further review this.  He ultimately presented to the Emergency Department the following day for worsening lower extremity edema. BNP was normal but was unsure if this was accurate given his body habitus. He was admitted for diuresis and his anasarca was felt to be due to decompensated alcoholic liver cirrhosis and hypoalbuminemia. He responded well with IV Lasix, Metolazone and Aldactone. Notes did mention that he had a transient episode of atrial fibrillation which resolved after IV Lopressor. Echocardiogram did show a preserved EF of greater than 75% with hyperdynamic function. Was noted to have grade 1 diastolic dysfunction, normal RV function, mild to moderate LA dilatation and borderline dilatation of the aortic root measuring 39 mm. He was ultimately discharged on Bumex 2 mg daily along with Jardiance 25 mg daily, Midodrine 10 mg twice daily, Pravastatin 20 mg daily and Spironolactone 100 mg daily.  In talking the patient and his significant other today, he reports he feels like he is starting to retain fluid again along his lower extremities. Denies any associated dyspnea on exertion, orthopnea or PND. No recent chest pain. Does report  having an occasional "skipping" sensation along his heart but was asymptomatic with his atrial fibrillation during admission. He did miss his appointment with GI last month and has not yet rescheduled this. Says he still consumes beer on a daily basis and it is unclear how much he drinks but at least 3-6+ beers daily and sometimes more by his description.  Studies Reviewed:   EKG: EKG is not ordered today.  Echocardiogram: 11/2022 IMPRESSIONS     1. Left ventricular ejection fraction, by estimation, is >75%. The left  ventricle has hyperdynamic function. The left ventricle has no regional  wall motion abnormalities. Left ventricular diastolic parameters are  consistent with Grade I diastolic  dysfunction (impaired relaxation).   2. Right ventricular systolic function is normal. The right ventricular  size is normal. Tricuspid regurgitation signal is inadequate for assessing  PA pressure.   3. Left atrial size was mild to moderately dilated.   4. The mitral valve is normal in structure. No evidence of mitral valve  regurgitation. No evidence of mitral stenosis.   5. The aortic valve has an indeterminant number of cusps. Aortic valve  regurgitation is not visualized. No aortic stenosis is present.   6. Aortic dilatation noted. There is borderline dilatation of the aortic  root, measuring 39 mm.   Comparison(s): No prior Echocardiogram.    Physical Exam:   VS:  BP 112/80   Pulse 88   Ht 5\' 11"  (1.803 m)   Wt 251 lb 6.4 oz (114 kg)   SpO2 90%   BMI 35.06 kg/m    Wt Readings  from Last 3 Encounters:  01/04/23 251 lb 6.4 oz (114 kg)  11/25/22 260 lb 5.8 oz (118.1 kg)  11/23/22 268 lb 9.6 oz (121.8 kg)     GEN: Well nourished, well developed male appearing in no acute distress. Sitting in wheelchair.  NECK: No JVD; No carotid bruits CARDIAC: RRR, no murmurs, rubs, gallops RESPIRATORY:  Clear to auscultation without rales, wheezing or rhonchi  ABDOMEN: Appears non-distended. No  obvious abdominal masses. EXTREMITIES: No clubbing or cyanosis. 1+ pitting edema bilaterally.  Distal pedal pulses are 2+ bilaterally.   Assessment and Plan:   1. HFpEF - It is unclear how much of his fluid buildup is due to heart failure vs. cirrhosis and hypoalbuminemia as documented during his admission. He reports his weight has increased on his home scales by at least 10 pounds since hospital discharge. He is currently taking Bumex 2 mg daily and will titrate to 3 mg daily with plans for a follow-up BMET in 1-2 weeks. Continue Jardiance. We reviewed the importance of reestablishing with GI as well.   2. Paroxysmal Atrial Fibrillation - This is listed in his chart and during his hospitalization he did have recurrent episodes as well. I did review his EKG tracings with Dr. Diona Browner (DOD) who felt they were most consistent with atrial fibrillation. At this time, would not add a beta-blocker given his intermittent hypotension. Would ultimately benefit from this pending BP trend given his atrial fibrillation and cirrhosis. - This patients CHA2DS2-VASc Score and unadjusted Ischemic Stroke Rate (% per year) is equal to 3.2 % stroke rate/year from a score of 3 (HTN, DM, Aortic Plaque). We reviewed risks and benefits of anticoagulation and he is undecided if he would wish to be on anticoagulation. Reports he was on Eliquis in the past but experienced easy bruising with this and feels like it was also stopped due to his anemia. I am also concerned about his bleeding risk given his cirrhosis, continued alcohol use and anemia as hemoglobin was as low as 7.8 during his recent admission. Through shared decision making, we will hold off on anticoagulation at this time and will ask GI to weigh in on this once he reestablishes care.  3. HTN - BP is at 112/80 during today's visit but he was having significant hypotension during his recent admission which led to initiation of Midodrine. He is currently taking 10 mg  twice daily and we will try reducing this to 5 mg twice daily. I did provide him with a BP log and he was encouraged to return this in several weeks as ultimately would like to wean this. He does remain on Spironolactone 100 mg daily at this time for his cirrhosis. May ultimately need to reduce this pending reassessment of his potassium over time as this was at 5.1 when checked on 12/28/2022.  4. HLD - Followed by his PCP. He remains on Pravastatin 20 mg daily.  5. Liver Cirrhosis/Recurrent Pancreatitis - He missed his last few appointments with GI and we reviewed the importance of rescheduling these. His wife is planning to go to the office and reschedule this today. At this time, he remains on Spironolactone 100 mg daily. BP does not allow for beta-blocker currently as he is already on Midodrine for BP support and we will adjust this as described above.   Signed, Ellsworth Lennox, PA-C

## 2023-01-16 ENCOUNTER — Emergency Department (HOSPITAL_COMMUNITY): Payer: BLUE CROSS/BLUE SHIELD

## 2023-01-16 ENCOUNTER — Other Ambulatory Visit: Payer: Self-pay

## 2023-01-16 ENCOUNTER — Emergency Department (HOSPITAL_COMMUNITY)
Admission: EM | Admit: 2023-01-16 | Discharge: 2023-01-16 | Disposition: A | Discharge status: Home / Self Care | Payer: BLUE CROSS/BLUE SHIELD | Attending: Emergency Medicine | Admitting: Emergency Medicine

## 2023-01-16 ENCOUNTER — Encounter (HOSPITAL_COMMUNITY): Payer: Self-pay

## 2023-01-16 DIAGNOSIS — F17210 Nicotine dependence, cigarettes, uncomplicated: Secondary | ICD-10-CM | POA: Insufficient documentation

## 2023-01-16 DIAGNOSIS — Z79899 Other long term (current) drug therapy: Secondary | ICD-10-CM | POA: Diagnosis not present

## 2023-01-16 DIAGNOSIS — Z7984 Long term (current) use of oral hypoglycemic drugs: Secondary | ICD-10-CM | POA: Insufficient documentation

## 2023-01-16 DIAGNOSIS — S82141A Displaced bicondylar fracture of right tibia, initial encounter for closed fracture: Secondary | ICD-10-CM | POA: Diagnosis not present

## 2023-01-16 DIAGNOSIS — J449 Chronic obstructive pulmonary disease, unspecified: Secondary | ICD-10-CM | POA: Insufficient documentation

## 2023-01-16 DIAGNOSIS — W19XXXA Unspecified fall, initial encounter: Secondary | ICD-10-CM

## 2023-01-16 DIAGNOSIS — I10 Essential (primary) hypertension: Secondary | ICD-10-CM | POA: Insufficient documentation

## 2023-01-16 DIAGNOSIS — E1142 Type 2 diabetes mellitus with diabetic polyneuropathy: Secondary | ICD-10-CM | POA: Insufficient documentation

## 2023-01-16 DIAGNOSIS — Z794 Long term (current) use of insulin: Secondary | ICD-10-CM | POA: Diagnosis not present

## 2023-01-16 DIAGNOSIS — S8991XA Unspecified injury of right lower leg, initial encounter: Secondary | ICD-10-CM | POA: Diagnosis present

## 2023-01-16 LAB — CBC WITH DIFFERENTIAL/PLATELET
Abs Immature Granulocytes: 0.03 10*3/uL (ref 0.00–0.07)
Basophils Absolute: 0.1 10*3/uL (ref 0.0–0.1)
Basophils Relative: 1 %
Eosinophils Absolute: 0 10*3/uL (ref 0.0–0.5)
Eosinophils Relative: 0 %
HCT: 35.5 % — ABNORMAL LOW (ref 39.0–52.0)
Hemoglobin: 11.3 g/dL — ABNORMAL LOW (ref 13.0–17.0)
Immature Granulocytes: 0 %
Lymphocytes Relative: 19 %
Lymphs Abs: 1.8 10*3/uL (ref 0.7–4.0)
MCH: 30.5 pg (ref 26.0–34.0)
MCHC: 31.8 g/dL (ref 30.0–36.0)
MCV: 95.7 fL (ref 80.0–100.0)
Monocytes Absolute: 1.2 10*3/uL — ABNORMAL HIGH (ref 0.1–1.0)
Monocytes Relative: 13 %
Neutro Abs: 6.1 10*3/uL (ref 1.7–7.7)
Neutrophils Relative %: 67 %
Platelets: 167 10*3/uL (ref 150–400)
RBC: 3.71 MIL/uL — ABNORMAL LOW (ref 4.22–5.81)
RDW: 20.3 % — ABNORMAL HIGH (ref 11.5–15.5)
WBC: 9.2 10*3/uL (ref 4.0–10.5)
nRBC: 0 % (ref 0.0–0.2)

## 2023-01-16 LAB — BASIC METABOLIC PANEL
Anion gap: 6 (ref 5–15)
BUN: 17 mg/dL (ref 6–20)
CO2: 29 mmol/L (ref 22–32)
Calcium: 7.7 mg/dL — ABNORMAL LOW (ref 8.9–10.3)
Chloride: 99 mmol/L (ref 98–111)
Creatinine, Ser: 1.13 mg/dL (ref 0.61–1.24)
GFR, Estimated: >60 mL/min (ref >60)
Glucose, Bld: 194 mg/dL — ABNORMAL HIGH (ref 70–99)
Potassium: 4.5 mmol/L (ref 3.5–5.1)
Sodium: 134 mmol/L — ABNORMAL LOW (ref 135–145)

## 2023-01-16 MED ORDER — MORPHINE SULFATE (PF) 4 MG/ML IV SOLN
4.0000 mg | Freq: Once | INTRAVENOUS | Status: AC
  Administered 2023-01-16: 4 mg via INTRAVENOUS
  Filled 2023-01-16: qty 1

## 2023-01-16 MED ORDER — SODIUM CHLORIDE 0.9 % IV BOLUS
1000.0000 mL | Freq: Once | INTRAVENOUS | Status: AC
  Administered 2023-01-16: 1000 mL via INTRAVENOUS

## 2023-01-16 MED ORDER — ONDANSETRON HCL 4 MG/2ML IJ SOLN
4.0000 mg | Freq: Once | INTRAMUSCULAR | Status: AC
  Administered 2023-01-16: 4 mg via INTRAVENOUS
  Filled 2023-01-16: qty 2

## 2023-01-16 MED ORDER — OXYCODONE HCL 5 MG PO TABS
5.0000 mg | ORAL_TABLET | ORAL | 0 refills | Status: AC | PRN
Start: 2023-01-16 — End: ?

## 2023-01-16 MED ORDER — KETOROLAC TROMETHAMINE 15 MG/ML IJ SOLN
15.0000 mg | Freq: Once | INTRAMUSCULAR | Status: AC
  Administered 2023-01-16: 15 mg via INTRAVENOUS
  Filled 2023-01-16: qty 1

## 2023-01-16 MED ORDER — IBUPROFEN 600 MG PO TABS: 600.0000 mg | ORAL_TABLET | Freq: Four times a day (QID) | ORAL | 0 refills | Status: DC | PRN

## 2023-01-16 NOTE — Discharge Instructions (Addendum)
It was our pleasure to provide your ER care today - we hope that you feel better.  Follow up with orthopedist/Dr Dallas Schimke, in office this Friday - call office tomorrow AM to arrange appointment.   Wear knee immobilizer, elevate knee/leg, use walker and/or wheelchair.   Take ibuprofen or acetaminophen as need for pain. You may also take oxycodoneas need for pain. No driving for the next 6 hours or when taking oxycodone.   Return to ER if worse, new symptoms, severe/intractable pain, numbness/weakness, or other concern.

## 2023-01-16 NOTE — ED Triage Notes (Signed)
Pt had fall at home last night. No LOC. Family assisted pt up. Pt states he was able to walk to his recliner this morning but unable to get out of recliner later. Pt complains of pain in right hip and leg. Pt has swelling BLE and states the swelling was present prior to fall and has been present for some time. Pt states he has neuropathy as well.

## 2023-01-16 NOTE — ED Notes (Signed)
Pt unable to use bedpan to have bm d/t pain. Brief applied to pt.

## 2023-01-16 NOTE — ED Provider Notes (Signed)
[place] EMERGENCY DEPARTMENT AT [facility] Provider Note  CSN: [id number] Arrival date & time: 01/16/23 1008  Chief Complaint(s) Leg Pain, Hip Pain (Left leg and hip pain), and Fall (Fall last night)  HPI [name] is a 57 y.o. male with past medical history as below, significant for cirrhosis, DM, gout, HLD, HTN, A-fib who presents to the ED with complaint of fall  Patient reports an apparent mechanical fall last night, fell onto his right knee.  No head injury, no LOC.  Required assistance getting up from the floor.  He was ambulatory following this, this morning he was unable to get up out of his chair due to significant pain to his right knee and lateral aspect of his right hip.  No significant back pain, no head injury or LOC.  Took Tylenol prior to arrival without much relief of symptoms.  No change to bowel or bladder function, no incontinence, saddle paresthesias, no injury to his back, no chest pain or dyspnea, abdominal pain, nausea or vomiting  Neuropathy to his lower extremities unchanged from baseline  Past Medical History Past Medical History:  Diagnosis Date   Cirrhosis of liver (HCC)    Degeneration of lumbar intervertebral disc    Diabetes mellitus without complication (HCC)    Diabetic neuropathy (HCC) 03/25/2012   Diverticulitis    DM type 2 (diabetes mellitus, type 2) (HCC) 03/24/2012   Gout    hx of   Hyperlipidemia 03/24/2012   Hypertension    Incisional hernia without mention of obstruction or gangrene 02/07/2011   Pancreatitis    Paroxysmal atrial fibrillation (HCC) 03/25/2012   Transient, occurred off of atenolol.   Renal disorder    Patient Active Problem List   Diagnosis Date Noted   Anasarca 11/24/2022   Iron deficiency anemia, unspecified 10/18/2022   Diabetic amyotrophy (HCC) 03/02/2022   Cirrhosis (HCC) 08/04/2021   Hypoalbuminemia 06/07/2021   Chronic anemia 04/24/2021   Mild pulmonary hypertension (HCC) 04/21/2021   At risk  for falls 03/21/2021   History of small bowel obstruction 03/21/2021   Sleep apnea 03/21/2021   Alcohol use 12/07/2020   Anxiety and depression 12/07/2020   COPD (chronic obstructive pulmonary disease) (HCC) 12/07/2020   Peripheral neuropathy 12/07/2020   Chronic inflammatory demyelinating polyradiculoneuropathy (HCC) 12/07/2020   Degeneration of lumbar intervertebral disc 11/28/2017   Back pain 11/13/2017   Pain of lumbar spine 11/13/2017   Partial small bowel obstruction (HCC) 08/26/2017   GERD (gastroesophageal reflux disease) 08/26/2017   Diabetes mellitus type 2, uncontrolled, with complications (HCC) 12/15/2012   Diabetic neuropathy (HCC) 03/25/2012   Pancreatitis, acute 03/23/2012   Hyponatremia 03/23/2012   Alcohol abuse 03/23/2012   GOUT 05/30/2009   Morbid obesity (HCC) 05/30/2009   Tobacco abuse 05/30/2009   Essential hypertension 05/30/2009   Home Medication(s) Prior to Admission medications   Medication Sig Start Date End Date Taking? Authorizing Provider  ibuprofen (ADVIL) 600 MG tablet Take 1 tablet (600 mg total) by mouth every 6 (six) hours as needed. 01/16/23  Yes Tanda Rockers A, DO  oxyCODONE (ROXICODONE) 5 MG immediate release tablet Take 1 tablet (5 mg total) by mouth every 4 (four) hours as needed for severe pain. 01/16/23  Yes Sloan Leiter, DO  acetaminophen (TYLENOL) 500 MG tablet Take 1,000 mg by mouth 2 (two) times daily.     [provider]  allopurinol (ZYLOPRIM) 300 MG tablet Take 300 mg by mouth daily. 01/21/11   [provider]  bumetanide (  BUMEX) 1 MG tablet Take 1 tablet (1 mg total) by mouth daily. 01/04/23   Strader, Lennart Pall, PA-C  bumetanide (BUMEX) 2 MG tablet Take 1 tablet (2 mg total) by mouth daily. 01/04/23   Strader, Lennart Pall, PA-C  cetirizine (ZYRTEC) 10 MG tablet Take 10 mg by mouth daily.    [provider]  diphenoxylate-atropine (LOMOTIL) 2.5-0.025 MG tablet Take 2 tablets by mouth 4 (four) times daily as  needed for diarrhea or loose stools. Patient not taking: Reported on 01/04/2023 12/01/22   Shon Hale, MD  folic acid (FOLVITE) 1 MG tablet Take 1 tablet (1 mg total) by mouth daily. 12/01/22   Shon Hale, MD  gabapentin (NEURONTIN) 300 MG capsule Take 600 mg by mouth in the morning and at bedtime. *May take 600mg  in addition if needed 10/06/17   [provider]  hydrOXYzine (ATARAX) 25 MG tablet Take 2 tablets (50 mg total) by mouth every 8 (eight) hours as needed for itching or anxiety. 12/01/22   Shon Hale, MD  JARDIANCE 25 MG TABS tablet Take 25 mg by mouth daily. 10/05/22   [provider]  LANTUS SOLOSTAR 100 UNIT/ML Solostar Pen 5-7 Units at bedtime.    [provider]  loperamide (IMODIUM) 2 MG capsule Take 2 mg by mouth daily as needed for diarrhea or loose stools.    [provider]  magnesium oxide (MAG-OX) 400 (240 Mg) MG tablet Take 400 mg by mouth daily.    [provider]  melatonin 3 MG TABS tablet Take 3 mg by mouth at bedtime.    [provider]  midodrine (PROAMATINE) 5 MG tablet Take 1 tablet (5 mg total) by mouth 2 (two) times daily with a meal. 01/04/23   Strader, [place] M, PA-C  Multiple Vitamins-Minerals (MULTIVITAMIN WITH MINERALS) tablet Take 1 tablet by mouth daily. 12/01/22 12/01/23  Shon Hale, MD  nicotine (NICODERM CQ - DOSED IN MG/24 HOURS) 21 mg/24hr patch Place 1 patch (21 mg total) onto the skin daily. Patient not taking: Reported on 01/04/2023 12/02/22   Shon Hale, MD  omeprazole (PRILOSEC) 20 MG capsule Take 20 mg by mouth daily.    [provider]  ondansetron (ZOFRAN) 4 MG tablet Take 4 mg by mouth every 8 (eight) hours as needed for nausea or vomiting.    [provider]  pravastatin (PRAVACHOL) 20 MG tablet Take 20 mg by mouth daily.    [provider]  Simethicone (GAS RELIEF PO) Take 1 tablet by mouth daily as needed (for gas relief).    [provider]  spironolactone (ALDACTONE) 100 MG tablet Take 100 mg by mouth daily.    [provider]  tamsulosin (FLOMAX) 0.4 MG CAPS capsule Take 1 capsule (0.4 mg total) by mouth daily. 12/01/22   Shon Hale, MD  thiamine (VITAMIN B-1) 100 MG tablet Take 1 tablet (100 mg total) by mouth daily. 12/02/22   Shon Hale, MD  Past Surgical History Past Surgical History:  Procedure Laterality Date   APPENDECTOMY  04/15/2009   COLON SURGERY  04/15/2009   HERNIA REPAIR  01/25/11   lap ventral hernia repair x9   LAPAROSCOPIC CHOLECYSTECTOMY  1993   LAPAROSCOPIC LYSIS OF ADHESIONS N/A 09/17/2012   Procedure: LAPAROSCOPIC and Open  LYSIS OF ADHESIONS;  Surgeon: Ardeth Sportsman, MD;  Location: [facility];  Service: General;  Laterality: N/A;   VENTRAL HERNIA REPAIR N/A 09/17/2012   Procedure: LAPAROSCOPIC VENTRAL HERNIA;  Surgeon: Ardeth Sportsman, MD;  Location: [facility];  Service: General;  Laterality: N/A;   Family History Family History  Problem Relation Age of Onset   Hypertension Mother    Hypertension Father     Social History Social History   Tobacco Use   Smoking status: Every Day    Current packs/day: 0.00    Average packs/day: 1 pack/day for 30.0 years (30.0 ttl pk-yrs)    Types: Cigarettes    Start date: 05/18/1980    Last attempt to quit: 05/18/2010    Years since quitting: 12.6   Smokeless tobacco: Never  Vaping Use   Vaping status: Never Used  Substance Use Topics   Alcohol use: Yes    Comment: "moderate"   Drug use: No   Allergies Hydrocodone and Venlafaxine  Review of Systems Review of Systems  Constitutional:  Negative for chills and fever.  Respiratory:  Negative for chest tightness and shortness of breath.   Cardiovascular:  Negative for chest pain and palpitations.  Gastrointestinal:  Negative for abdominal pain,  nausea and vomiting.  Genitourinary:  Negative for dysuria.  Musculoskeletal:  Positive for arthralgias, gait problem and joint swelling.  Neurological:  Negative for syncope and headaches.  All other systems reviewed and are negative.   Physical Exam Vital Signs  I have reviewed the triage vital signs BP (!) 131/90   Pulse 84   Temp 98 F (36.7 C) (Oral)   Resp 15   Ht 5\' 11"  (1.803 m)   Wt 114 kg   SpO2 96%   BMI 35.05 kg/m  Physical Exam Vitals and nursing note reviewed.  Constitutional:      General: He is not in acute distress.    Appearance: He is well-developed. He is obese.  HENT:     Head: Normocephalic and atraumatic.     Right Ear: External ear normal.     Left Ear: External ear normal.     Mouth/Throat:     Mouth: Mucous membranes are moist.  Eyes:     General: No scleral icterus. Cardiovascular:     Rate and Rhythm: Normal rate and regular rhythm.     Pulses: Normal pulses.     Heart sounds: Normal heart sounds.  Pulmonary:     Effort: Pulmonary effort is normal. No respiratory distress.     Breath sounds: Normal breath sounds.  Abdominal:     General: Abdomen is flat.     Palpations: Abdomen is soft.     Tenderness: There is no abdominal tenderness.  Musculoskeletal:     Cervical back: No rigidity.     Right lower leg: Edema present.     Left lower leg: Edema present.       Legs:     Comments: LE NVI Range of motion right lower extremity limited secondary to pain  Quadricep, patella and Achilles tendons are intact right lower extremity  Skin:    General: Skin is warm and dry.  Capillary Refill: Capillary refill takes less than 2 seconds.  Neurological:     Mental Status: He is alert.  Psychiatric:        Mood and Affect: Mood normal.        Behavior: Behavior normal.     ED Results and Treatments Labs (all labs ordered are listed, but only abnormal results are displayed) Labs Reviewed  CBC WITH DIFFERENTIAL/PLATELET - Abnormal;  Notable for the following components:      Result Value   RBC 3.71 (*)    Hemoglobin 11.3 (*)    HCT 35.5 (*)    RDW 20.3 (*)    Monocytes Absolute 1.2 (*)    All other components within normal limits  BASIC METABOLIC PANEL - Abnormal; Notable for the following components:   Sodium 134 (*)    Glucose, Bld 194 (*)    Calcium 7.7 (*)    All other components within normal limits                                                                                                                          Radiology DG Hip Unilat W or Wo Pelvis 2-3 Views Right  Result Date: 01/16/2023 CLINICAL DATA:  Right hip pain after fall yesterday. EXAM: DG HIP (WITH OR WITHOUT PELVIS) 2-3V RIGHT COMPARISON:  None Available. FINDINGS: No acute fracture of the pelvis or right hip. No hip dislocation. Mild right acetabular spurring. Pubic rami are intact. Pubic symphysis and sacroiliac joints are congruent. IMPRESSION: No acute fracture of the pelvis or right hip. Electronically Signed   By: Narda Rutherford M.D.   On: 01/16/2023 13:33   DG Knee Complete 4 Views Right  Result Date: 01/16/2023 CLINICAL DATA:  Right knee pain, fall yesterday. EXAM: RIGHT KNEE - COMPLETE 4+ VIEW COMPARISON:  None Available. FINDINGS: The bones are subjectively under mineralized. There is a minimally displaced fractures of the tibial spines extending to the lateral tibial plateau. No significant articular depression. Moderate-sized lipohemarthrosis. IMPRESSION: Minimally displaced lateral tibial plateau fracture involving the tibial spines. Moderate-sized lipohemarthrosis. Electronically Signed   By: Narda Rutherford M.D.   On: 01/16/2023 13:32    Pertinent labs & imaging results that were available during my care of the patient were reviewed by me and considered in my medical decision making (see MDM for details).  Medications Ordered in ED Medications  morphine (PF) 4 MG/ML injection 4 mg (4 mg Intravenous Given 01/16/23 1136)   ondansetron (ZOFRAN) injection 4 mg (4 mg Intravenous Given 01/16/23 1135)  sodium chloride 0.9 % bolus 1,000 mL (1,000 mLs Intravenous New Bag/Given 01/16/23 1137)  ketorolac (TORADOL) 15 MG/ML injection 15 mg (15 mg Intravenous Given 01/16/23 1308)  Procedures Procedures  (including critical care time)  Medical Decision Making / ED Course    Medical Decision Making:    [name] is a 57 y.o. male with past medical history as below, significant for cirrhosis, DM, gout, HLD, HTN, A-fib who presents to the ED with complaint of fall. The complaint involves an extensive differential diagnosis and also carries with it a high risk of complications and morbidity.  Serious etiology was considered. Ddx includes but is not limited to: Sprain, strain, dislocation, soft tissue injury, fracture, MSK injury, etc.  Complete initial physical exam performed, notably the patient  was no acute distress, LE NVI.    Reviewed and confirmed nursing documentation for past medical history, family history, social history.  Vital signs reviewed.    Clinical Course as of 01/16/23 1617  Wed Jan 16, 2023  1432 Tibial plateau fracture and surgery, will get CT.  Place knee immobilizer.  Pain control.  Will discuss with orthopedics [SG]  1512 Ortho paged, awaiting cb [SG]    Clinical Course User Index [SG] Tanda Rockers A, DO     He has tibial plateau fracture, LE NVI.  Placed in the immobilizer.  Pending Ortho callback and CT read.  Pain well-controlled     Signed out to incoming EDP pending Ortho callback and CT read.            Additional history obtained: -Additional history obtained from family -External records from outside source obtained and reviewed including: Chart review including previous notes, labs, imaging, consultation notes including  Prior  admission, home medications, prior labs and imaging   Lab Tests: -I ordered, reviewed, and interpreted labs.   The pertinent results include:   Labs Reviewed  CBC WITH DIFFERENTIAL/PLATELET - Abnormal; Notable for the following components:      Result Value   RBC 3.71 (*)    Hemoglobin 11.3 (*)    HCT 35.5 (*)    RDW 20.3 (*)    Monocytes Absolute 1.2 (*)    All other components within normal limits  BASIC METABOLIC PANEL - Abnormal; Notable for the following components:   Sodium 134 (*)    Glucose, Bld 194 (*)    Calcium 7.7 (*)    All other components within normal limits    Notable for labs stable  EKG   EKG Interpretation Date/Time:    Ventricular Rate:    PR Interval:    QRS Duration:    QT Interval:    QTC Calculation:   R Axis:      Text Interpretation:           Imaging Studies ordered: I ordered imaging studies including x-ray right hip and r knee, CT right knee I independently visualized the following imaging with scope of interpretation limited to determining acute life threatening conditions related to emergency care; findings noted above, significant for tibial plat fx, CT pending  I independently visualized and interpreted imaging. I agree with the radiologist interpretation   Medicines ordered and prescription drug management: Meds ordered this encounter  Medications   morphine (PF) 4 MG/ML injection 4 mg   ondansetron (ZOFRAN) injection 4 mg   sodium chloride 0.9 % bolus 1,000 mL   ketorolac (TORADOL) 15 MG/ML injection 15 mg   oxyCODONE (ROXICODONE) 5 MG immediate release tablet    Sig: Take 1 tablet (5 mg total) by mouth every 4 (four) hours as needed for severe pain.    Dispense:  15 tablet  Refill:  0   ibuprofen (ADVIL) 600 MG tablet    Sig: Take 1 tablet (600 mg total) by mouth every 6 (six) hours as needed.    Dispense:  30 tablet    Refill:  0    -I have reviewed the patients home medicines and have made adjustments as  needed   Consultations Obtained: I requested consultation with the Dr Dallas Schimke, > pending callback   Cardiac Monitoring: Continuous pulse oximetry interpreted by myself, 99% on RA.    Social Determinants of Health:  Diagnosis or treatment significantly limited by social determinants of health: current smoker and obesity   Reevaluation: After the interventions noted above, I reevaluated the patient and found that they have improved  Co morbidities that complicate the patient evaluation  Past Medical History:  Diagnosis Date   Cirrhosis of liver (HCC)    Degeneration of lumbar intervertebral disc    Diabetes mellitus without complication (HCC)    Diabetic neuropathy (HCC) 03/25/2012   Diverticulitis    DM type 2 (diabetes mellitus, type 2) (HCC) 03/24/2012   Gout    hx of   Hyperlipidemia 03/24/2012   Hypertension    Incisional hernia without mention of obstruction or gangrene 02/07/2011   Pancreatitis    Paroxysmal atrial fibrillation (HCC) 03/25/2012   Transient, occurred off of atenolol.   Renal disorder       Dispostion: Disposition decision including need for hospitalization was considered, and patient disposition pending at time of sign out.    Final Clinical Impression(s) / ED Diagnoses Final diagnoses:  Closed fracture of right tibial plateau, initial encounter  Fall, initial encounter        Sloan Leiter, DO 01/16/23 1617

## 2023-01-16 NOTE — ED Provider Notes (Addendum)
Pt signed out that CT for tibia plateau fracture pending, to d/c to ortho f/u.  CT completed, discussed w pt.    Ortho consulted, discussed w Dr Dallas Schimke - he indicates from ortho standpoint, does not plan to admit for surgery, but to place in knee immobilizer, walker/crutches, and have f/u in office in coming week.   Nursing indicates patient not able to get up, ambulate.  I discussed w pt, re offered admission and possible rehab, vs d/c.  Pt indicates he prefers to go home, does not want to be admitted. Indicates has walker and wheelchair at home, family indicates can help him/lives there, and can also get bedside commode if need.   Pt up w walker. Continues to express desire for d/c.        Cathren Laine, MD 01/16/23 [phone]

## 2023-01-16 NOTE — ED Notes (Signed)
Discharge instructions reviewed with pt/ significant other.   Pharmacy verified. Newly prescribed medications discussed.   Opportunity for questions and concerns provided.   Pt has crutches and walker in possession. Minimally able to demonstrate proper use but plans to stay in wheelchair until orthopedics follow up.

## 2023-01-18 ENCOUNTER — Encounter: Payer: Self-pay | Admitting: Orthopedic Surgery

## 2023-01-18 ENCOUNTER — Institutional Professional Consult (permissible substitution): Payer: BLUE CROSS/BLUE SHIELD | Admitting: Diagnostic Neuroimaging

## 2023-01-18 ENCOUNTER — Ambulatory Visit (INDEPENDENT_AMBULATORY_CARE_PROVIDER_SITE_OTHER): Payer: BLUE CROSS/BLUE SHIELD | Admitting: Orthopedic Surgery

## 2023-01-18 DIAGNOSIS — S82141A Displaced bicondylar fracture of right tibia, initial encounter for closed fracture: Secondary | ICD-10-CM

## 2023-01-18 MED ORDER — OXYCODONE HCL 5 MG PO TABS: 5.0000 mg | ORAL_TABLET | ORAL | 0 refills | Status: AC | PRN

## 2023-01-18 NOTE — Progress Notes (Signed)
New Patient Visit  Assessment: [name] is a 57 y.o. male with the following: 1. Closed fracture of right tibial plateau, initial encounter  Plan: [name] has sustained a medial and lateral tibial plateau fractures, with minimal displacement.  He has a lot of swelling in the right knee.  Based on the appearance on x-ray, as well as CT scan, I think this can be treated without surgery.  This was discussed with the patient in clinic today.  He does have a's significant effusion.  I recommended aspiration, with injection of local anesthetic.  This should help with his pain for for relatively short period of time.  In addition, I will provide him with a prescription for wheelchair, as well as some oxycodone.  He tolerated the procedure well.  I would like see him back in 2 weeks.  Procedure note - aspiration right knee joint   Verbal consent was obtained to aspirate the right knee joint  Timeout was completed to confirm the site of aspiration. The skin was prepped with alcohol and ethyl chloride was sprayed at the aspiration site.  An 18-gauge needle was inserted and 120 cc of bloody joint fluid was aspirated using a superolateral approach.  Using the same needle, we then injected 10 cc of 0.5% marcaine There were no complications. A sterile bandage was applied.   Follow-up: Return in about 2 weeks (around 02/01/2023).  Subjective:  Chief Complaint  Patient presents with   Fracture    R tib fx DOI 01/15/23    History of Present Illness: [name] is a 57 y.o. male who presents for evaluation of right knee pain.  He has peripheral neuropathy.  He states he has had several falls.  He had a fall 2 days ago, presented to the emergency department.  He is complaining of pain in the right knee.  X-rays and CT scan demonstrates a minimally displaced fracture of the tibial plateau.  He has been wearing a knee immobilizer.  He was provided a prescription for oxycodone, but he has not  received this prescription yet.  He lives at home, and has a wheelchair, but it does not fit throughout the home.  He does have assistive device.   Review of Systems: No fevers or chills No numbness or tingling No chest pain No shortness of breath No bowel or bladder dysfunction No GI distress No headaches   Medical History:  Past Medical History:  Diagnosis Date   Cirrhosis of liver (HCC)    Degeneration of lumbar intervertebral disc    Diabetes mellitus without complication (HCC)    Diabetic neuropathy (HCC) 03/25/2012   Diverticulitis    DM type 2 (diabetes mellitus, type 2) (HCC) 03/24/2012   Gout    hx of   Hyperlipidemia 03/24/2012   Hypertension    Incisional hernia without mention of obstruction or gangrene 02/07/2011   Pancreatitis    Paroxysmal atrial fibrillation (HCC) 03/25/2012   Transient, occurred off of atenolol.   Renal disorder     Past Surgical History:  Procedure Laterality Date   APPENDECTOMY  04/15/2009   COLON SURGERY  04/15/2009   HERNIA REPAIR  01/25/11   lap ventral hernia repair x9   LAPAROSCOPIC CHOLECYSTECTOMY  1993   LAPAROSCOPIC LYSIS OF ADHESIONS N/A 09/17/2012   Procedure: LAPAROSCOPIC and Open  LYSIS OF ADHESIONS;  Surgeon: Ardeth Sportsman, MD;  Location: [facility];  Service: General;  Laterality: N/A;   VENTRAL HERNIA REPAIR N/A 09/17/2012  Procedure: LAPAROSCOPIC VENTRAL HERNIA;  Surgeon: Ardeth Sportsman, MD;  Location: [facility];  Service: General;  Laterality: N/A;    Family History  Problem Relation Age of Onset   Hypertension Mother    Hypertension Father    Social History   Tobacco Use   Smoking status: Every Day    Current packs/day: 0.00    Average packs/day: 1 pack/day for 30.0 years (30.0 ttl pk-yrs)    Types: Cigarettes    Start date: 05/18/1980    Last attempt to quit: 05/18/2010    Years since quitting: 12.6   Smokeless tobacco: Never  Vaping Use   Vaping status: Never Used  Substance Use Topics   Alcohol use: Yes     Comment: "moderate"   Drug use: No    Allergies  Allergen Reactions   Hydrocodone Itching   Venlafaxine Itching    Current Meds  Medication Sig   acetaminophen (TYLENOL) 500 MG tablet Take 1,000 mg by mouth 2 (two) times daily.    allopurinol (ZYLOPRIM) 300 MG tablet Take 300 mg by mouth daily.   bumetanide (BUMEX) 1 MG tablet Take 1 tablet (1 mg total) by mouth daily.   bumetanide (BUMEX) 2 MG tablet Take 1 tablet (2 mg total) by mouth daily.   cetirizine (ZYRTEC) 10 MG tablet Take 10 mg by mouth daily.   diphenoxylate-atropine (LOMOTIL) 2.5-0.025 MG tablet Take 2 tablets by mouth 4 (four) times daily as needed for diarrhea or loose stools.   folic acid (FOLVITE) 1 MG tablet Take 1 tablet (1 mg total) by mouth daily.   gabapentin (NEURONTIN) 300 MG capsule Take 600 mg by mouth in the morning and at bedtime. *May take 600mg  in addition if needed   hydrOXYzine (ATARAX) 25 MG tablet Take 2 tablets (50 mg total) by mouth every 8 (eight) hours as needed for itching or anxiety.   ibuprofen (ADVIL) 600 MG tablet Take 1 tablet (600 mg total) by mouth every 6 (six) hours as needed.   JARDIANCE 25 MG TABS tablet Take 25 mg by mouth daily.   LANTUS SOLOSTAR 100 UNIT/ML Solostar Pen 5-7 Units at bedtime.   loperamide (IMODIUM) 2 MG capsule Take 2 mg by mouth daily as needed for diarrhea or loose stools.   magnesium oxide (MAG-OX) 400 (240 Mg) MG tablet Take 400 mg by mouth daily.   melatonin 3 MG TABS tablet Take 3 mg by mouth at bedtime.   midodrine (PROAMATINE) 5 MG tablet Take 1 tablet (5 mg total) by mouth 2 (two) times daily with a meal.   Multiple Vitamins-Minerals (MULTIVITAMIN WITH MINERALS) tablet Take 1 tablet by mouth daily.   nicotine (NICODERM CQ - DOSED IN MG/24 HOURS) 21 mg/24hr patch Place 1 patch (21 mg total) onto the skin daily.   omeprazole (PRILOSEC) 20 MG capsule Take 20 mg by mouth daily.   ondansetron (ZOFRAN) 4 MG tablet Take 4 mg by mouth every 8 (eight) hours as  needed for nausea or vomiting.   oxyCODONE (ROXICODONE) 5 MG immediate release tablet Take 1 tablet (5 mg total) by mouth every 4 (four) hours as needed for up to 7 days.   pravastatin (PRAVACHOL) 20 MG tablet Take 20 mg by mouth daily.   Simethicone (GAS RELIEF PO) Take 1 tablet by mouth daily as needed (for gas relief).   spironolactone (ALDACTONE) 100 MG tablet Take 100 mg by mouth daily.   tamsulosin (FLOMAX) 0.4 MG CAPS capsule Take 1 capsule (0.4 mg total) by mouth  daily.   thiamine (VITAMIN B-1) 100 MG tablet Take 1 tablet (100 mg total) by mouth daily.    Objective: There were no vitals taken for this visit.  Physical Exam:  General: Alert and oriented. and No acute distress. Gait: Unable to ambulate.  Evaluation of right knee demonstrates a large effusion.  He has some bruising.  Tenderness to palpation.  The kneecap is ballotable.  Decreased sensation globally distal to the knee.  Toes warm well-perfused.  IMAGING: I personally reviewed images previously obtained from the ED   CT scan from the emergency department demonstrates a minimally displaced fracture of both the medial and lateral tibial plateau.  1 mm of depression on the lateral plateau.   New Medications:  Meds ordered this encounter  Medications   oxyCODONE (ROXICODONE) 5 MG immediate release tablet    Sig: Take 1 tablet (5 mg total) by mouth every 4 (four) hours as needed for up to 7 days.    Dispense:  30 tablet    Refill:  0      Oliver Barre, MD  01/18/2023 10:33 AM

## 2023-02-01 ENCOUNTER — Other Ambulatory Visit (INDEPENDENT_AMBULATORY_CARE_PROVIDER_SITE_OTHER): Payer: BLUE CROSS/BLUE SHIELD

## 2023-02-01 ENCOUNTER — Encounter: Payer: Self-pay | Admitting: Orthopedic Surgery

## 2023-02-01 ENCOUNTER — Ambulatory Visit: Payer: BLUE CROSS/BLUE SHIELD | Admitting: Orthopedic Surgery

## 2023-02-01 DIAGNOSIS — S82141A Displaced bicondylar fracture of right tibia, initial encounter for closed fracture: Secondary | ICD-10-CM | POA: Diagnosis not present

## 2023-02-01 MED ORDER — IBUPROFEN 600 MG PO TABS: 600.0000 mg | ORAL_TABLET | Freq: Two times a day (BID) | ORAL | 0 refills | Status: AC | PRN

## 2023-02-01 NOTE — Progress Notes (Signed)
Return patient Visit  Assessment: [name] is a 57 y.o. male with the following: 1. Closed fracture of right tibial plateau, initial encounter  Plan: [name] has a stable right tibial plateau fracture.  Radiographs remain unchanged.  I would like for him to continue using the knee brace to limit his range of motion.  Limit weightbearing.  Medications as needed.  He does continue to have some swelling, but he states this is more likely some chronic swelling related to his medical medical comorbidities.  We will continue to monitor him closely.  I will see him back in 2 weeks.    Follow-up: Return in about 2 weeks (around 02/15/2023).  Subjective:  Chief Complaint  Patient presents with   Fracture    R knee DOI 01/15/23     History of Present Illness: [name] is a 57 y.o. male who returns for evaluation of right knee pain.  He sustained a fall, a little over 2 weeks ago.  He has a right lateral tibial plateau fracture.  Swelling and pain has improved.  He has been putting some weight on the right leg, out of necessity.  He has been sleeping without the brace.  This pain is improving.  He has not been taking his regular medications for blood pressure or a "fluid pill" because he is unable to get up to use the restroom frequently.  Review of Systems: No fevers or chills No chest pain No shortness of breath No bowel or bladder dysfunction No GI distress No headaches    Objective: There were no vitals taken for this visit.  Physical Exam:  General: Alert and oriented. and No acute distress. Gait: Unable to ambulate.  Right knee with persistent knee effusion.  Minimal bruising.  Tenderness to palpation over the lateral tibial plateau.  Decree sensation distal to the knee.  IMAGING: I personally ordered and reviewed the following images  AP and lateral x-ray of the right knee was obtained.  These are compared to prior x-rays.  Minimally displaced and depressed  fracture of the lateral tibial plateau remains in unchanged position.  There has been no further subsidence.  No obvious callus formation.  No new injuries.  No bony lesions.  Impression: Stable right tibial plateau fracture   New Medications:  Meds ordered this encounter  Medications   ibuprofen (ADVIL) 600 MG tablet    Sig: Take 1 tablet (600 mg total) by mouth 2 (two) times daily as needed.    Dispense:  30 tablet    Refill:  0      Oliver Barre, MD  02/01/2023 10:54 AM

## 2023-02-13 ENCOUNTER — Encounter: Payer: BLUE CROSS/BLUE SHIELD | Admitting: Orthopedic Surgery

## 2023-03-06 ENCOUNTER — Ambulatory Visit: Payer: BLUE CROSS/BLUE SHIELD | Admitting: Internal Medicine

## 2023-03-08 ENCOUNTER — Ambulatory Visit: Payer: BLUE CROSS/BLUE SHIELD | Admitting: Student

## 2023-07-23 DEATH — deceased
